# Patient Record
Sex: Male | Born: 1945 | Race: White | Hispanic: No | Marital: Married | Smoking: Current every day smoker
Health system: Southern US, Community
[De-identification: ages and names within clinical notes are randomized; demographics above are authoritative.]

## PROBLEM LIST (undated history)

## (undated) DIAGNOSIS — M1990 Unspecified osteoarthritis, unspecified site: Secondary | ICD-10-CM

## (undated) DIAGNOSIS — I10 Essential (primary) hypertension: Secondary | ICD-10-CM

## (undated) DIAGNOSIS — I639 Cerebral infarction, unspecified: Secondary | ICD-10-CM

## (undated) HISTORY — PX: CHOLECYSTECTOMY: SHX55

---

## 1969-07-28 HISTORY — PX: LUMBAR DISC SURGERY: SHX700

## 2005-11-16 ENCOUNTER — Emergency Department (HOSPITAL_COMMUNITY): Admission: EM | Admit: 2005-11-16 | Discharge: 2005-11-16 | Payer: Self-pay | Admitting: Family Medicine

## 2020-07-29 DIAGNOSIS — F17200 Nicotine dependence, unspecified, uncomplicated: Secondary | ICD-10-CM | POA: Insufficient documentation

## 2020-12-21 ENCOUNTER — Other Ambulatory Visit: Payer: Self-pay

## 2020-12-21 ENCOUNTER — Encounter (HOSPITAL_COMMUNITY): Payer: Self-pay | Admitting: Internal Medicine

## 2020-12-21 ENCOUNTER — Emergency Department (HOSPITAL_COMMUNITY): Payer: Medicare Other

## 2020-12-21 ENCOUNTER — Inpatient Hospital Stay (HOSPITAL_COMMUNITY)
Admission: EM | Admit: 2020-12-21 | Discharge: 2020-12-26 | DRG: 065 | Disposition: A | Discharge status: Rehabilitation Facility | Payer: Medicare Other | Attending: Internal Medicine | Admitting: Internal Medicine

## 2020-12-21 DIAGNOSIS — Z8673 Personal history of transient ischemic attack (TIA), and cerebral infarction without residual deficits: Secondary | ICD-10-CM

## 2020-12-21 DIAGNOSIS — R29810 Facial weakness: Secondary | ICD-10-CM | POA: Diagnosis present

## 2020-12-21 DIAGNOSIS — D649 Anemia, unspecified: Secondary | ICD-10-CM | POA: Diagnosis present

## 2020-12-21 DIAGNOSIS — N1831 Chronic kidney disease, stage 3a: Secondary | ICD-10-CM | POA: Diagnosis present

## 2020-12-21 DIAGNOSIS — G459 Transient cerebral ischemic attack, unspecified: Secondary | ICD-10-CM | POA: Diagnosis not present

## 2020-12-21 DIAGNOSIS — I10 Essential (primary) hypertension: Secondary | ICD-10-CM | POA: Diagnosis not present

## 2020-12-21 DIAGNOSIS — I160 Hypertensive urgency: Secondary | ICD-10-CM | POA: Diagnosis present

## 2020-12-21 DIAGNOSIS — F419 Anxiety disorder, unspecified: Secondary | ICD-10-CM | POA: Diagnosis present

## 2020-12-21 DIAGNOSIS — G4700 Insomnia, unspecified: Secondary | ICD-10-CM | POA: Diagnosis present

## 2020-12-21 DIAGNOSIS — N179 Acute kidney failure, unspecified: Secondary | ICD-10-CM | POA: Diagnosis present

## 2020-12-21 DIAGNOSIS — I639 Cerebral infarction, unspecified: Secondary | ICD-10-CM | POA: Diagnosis not present

## 2020-12-21 DIAGNOSIS — Z820 Family history of epilepsy and other diseases of the nervous system: Secondary | ICD-10-CM

## 2020-12-21 DIAGNOSIS — K5900 Constipation, unspecified: Secondary | ICD-10-CM | POA: Diagnosis present

## 2020-12-21 DIAGNOSIS — I129 Hypertensive chronic kidney disease with stage 1 through stage 4 chronic kidney disease, or unspecified chronic kidney disease: Secondary | ICD-10-CM | POA: Diagnosis present

## 2020-12-21 DIAGNOSIS — Z7982 Long term (current) use of aspirin: Secondary | ICD-10-CM

## 2020-12-21 DIAGNOSIS — Z20822 Contact with and (suspected) exposure to covid-19: Secondary | ICD-10-CM | POA: Diagnosis present

## 2020-12-21 DIAGNOSIS — R29700 NIHSS score 0: Secondary | ICD-10-CM | POA: Diagnosis present

## 2020-12-21 DIAGNOSIS — Z79899 Other long term (current) drug therapy: Secondary | ICD-10-CM

## 2020-12-21 DIAGNOSIS — R260 Ataxic gait: Secondary | ICD-10-CM | POA: Diagnosis present

## 2020-12-21 DIAGNOSIS — E785 Hyperlipidemia, unspecified: Secondary | ICD-10-CM | POA: Diagnosis present

## 2020-12-21 DIAGNOSIS — G8191 Hemiplegia, unspecified affecting right dominant side: Secondary | ICD-10-CM | POA: Diagnosis present

## 2020-12-21 DIAGNOSIS — Z720 Tobacco use: Secondary | ICD-10-CM

## 2020-12-21 DIAGNOSIS — F17210 Nicotine dependence, cigarettes, uncomplicated: Secondary | ICD-10-CM | POA: Diagnosis present

## 2020-12-21 DIAGNOSIS — Z7902 Long term (current) use of antithrombotics/antiplatelets: Secondary | ICD-10-CM

## 2020-12-21 DIAGNOSIS — E869 Volume depletion, unspecified: Secondary | ICD-10-CM | POA: Diagnosis present

## 2020-12-21 DIAGNOSIS — R739 Hyperglycemia, unspecified: Secondary | ICD-10-CM | POA: Diagnosis present

## 2020-12-21 DIAGNOSIS — R471 Dysarthria and anarthria: Secondary | ICD-10-CM | POA: Diagnosis present

## 2020-12-21 DIAGNOSIS — R531 Weakness: Secondary | ICD-10-CM

## 2020-12-21 DIAGNOSIS — F32A Depression, unspecified: Secondary | ICD-10-CM | POA: Diagnosis present

## 2020-12-21 DIAGNOSIS — J449 Chronic obstructive pulmonary disease, unspecified: Secondary | ICD-10-CM | POA: Diagnosis present

## 2020-12-21 DIAGNOSIS — D696 Thrombocytopenia, unspecified: Secondary | ICD-10-CM | POA: Diagnosis present

## 2020-12-21 HISTORY — DX: Essential (primary) hypertension: I10

## 2020-12-21 LAB — DIFFERENTIAL
Abs Immature Granulocytes: 0.02 10*3/uL (ref 0.00–0.07)
Basophils Absolute: 0.1 10*3/uL (ref 0.0–0.1)
Basophils Relative: 1 %
Eosinophils Absolute: 0.1 10*3/uL (ref 0.0–0.5)
Eosinophils Relative: 1 %
Immature Granulocytes: 0 %
Lymphocytes Relative: 32 %
Lymphs Abs: 2.3 10*3/uL (ref 0.7–4.0)
Monocytes Absolute: 0.6 10*3/uL (ref 0.1–1.0)
Monocytes Relative: 8 %
Neutro Abs: 4.2 10*3/uL (ref 1.7–7.7)
Neutrophils Relative %: 58 %

## 2020-12-21 LAB — CBC
HCT: 41.7 % (ref 39.0–52.0)
HCT: 43.1 % (ref 39.0–52.0)
Hemoglobin: 14.5 g/dL (ref 13.0–17.0)
Hemoglobin: 15.3 g/dL (ref 13.0–17.0)
MCH: 31.8 pg (ref 26.0–34.0)
MCH: 32 pg (ref 26.0–34.0)
MCHC: 34.8 g/dL (ref 30.0–36.0)
MCHC: 35.5 g/dL (ref 30.0–36.0)
MCV: 91.4 fL (ref 80.0–100.0)
MCV: 90.2 fL (ref 80.0–100.0)
Platelets: 96 10*3/uL — ABNORMAL LOW (ref 150–400)
Platelets: 93 10*3/uL — ABNORMAL LOW (ref 150–400)
RBC: 4.56 MIL/uL (ref 4.22–5.81)
RBC: 4.78 MIL/uL (ref 4.22–5.81)
RDW: 12.5 % (ref 11.5–15.5)
RDW: 12.4 % (ref 11.5–15.5)
WBC: 7.2 10*3/uL (ref 4.0–10.5)
WBC: 6.9 10*3/uL (ref 4.0–10.5)
nRBC: 0 % (ref 0.0–0.2)
nRBC: 0 % (ref 0.0–0.2)

## 2020-12-21 LAB — POCT I-STAT, CHEM 8
BUN: 22 mg/dL (ref 8–23)
Calcium, Ion: 1.25 mmol/L (ref 1.15–1.40)
Chloride: 106 mmol/L (ref 98–111)
Creatinine, Ser: 1.5 mg/dL — ABNORMAL HIGH (ref 0.61–1.24)
Glucose, Bld: 100 mg/dL — ABNORMAL HIGH (ref 70–99)
HCT: 38 % — ABNORMAL LOW (ref 39.0–52.0)
Hemoglobin: 12.9 g/dL — ABNORMAL LOW (ref 13.0–17.0)
Potassium: 3.7 mmol/L (ref 3.5–5.1)
Sodium: 142 mmol/L (ref 135–145)
TCO2: 26 mmol/L (ref 22–32)

## 2020-12-21 LAB — COMPREHENSIVE METABOLIC PANEL
ALT: 15 U/L (ref 0–44)
AST: 20 U/L (ref 15–41)
Albumin: 3.7 g/dL (ref 3.5–5.0)
Alkaline Phosphatase: 48 U/L (ref 38–126)
Anion gap: 5 (ref 5–15)
BUN: 19 mg/dL (ref 8–23)
CO2: 27 mmol/L (ref 22–32)
Calcium: 10 mg/dL (ref 8.9–10.3)
Chloride: 108 mmol/L (ref 98–111)
Creatinine, Ser: 1.51 mg/dL — ABNORMAL HIGH (ref 0.61–1.24)
GFR, Estimated: 49 mL/min — ABNORMAL LOW (ref >60)
Glucose, Bld: 104 mg/dL — ABNORMAL HIGH (ref 70–99)
Potassium: 3.8 mmol/L (ref 3.5–5.1)
Sodium: 140 mmol/L (ref 135–145)
Total Bilirubin: 0.9 mg/dL (ref 0.3–1.2)
Total Protein: 6.3 g/dL — ABNORMAL LOW (ref 6.5–8.1)

## 2020-12-21 LAB — PROTIME-INR
INR: 1 (ref 0.8–1.2)
Prothrombin Time: 13.5 seconds (ref 11.4–15.2)

## 2020-12-21 LAB — URINALYSIS, ROUTINE W REFLEX MICROSCOPIC
Bilirubin Urine: NEGATIVE
Glucose, UA: NEGATIVE mg/dL
Hgb urine dipstick: NEGATIVE
Ketones, ur: NEGATIVE mg/dL
Leukocytes,Ua: NEGATIVE
Nitrite: NEGATIVE
Protein, ur: NEGATIVE mg/dL
Specific Gravity, Urine: 1.02 (ref 1.005–1.030)
pH: 7 (ref 5.0–8.0)

## 2020-12-21 LAB — RAPID URINE DRUG SCREEN, HOSP PERFORMED
Amphetamines: NOT DETECTED
Barbiturates: NOT DETECTED
Benzodiazepines: POSITIVE — AB
Cocaine: NOT DETECTED
Opiates: NOT DETECTED
Tetrahydrocannabinol: NOT DETECTED

## 2020-12-21 LAB — RESP PANEL BY RT-PCR (FLU A&B, COVID) ARPGX2
Influenza A by PCR: NEGATIVE
Influenza B by PCR: NEGATIVE
SARS Coronavirus 2 by RT PCR: NEGATIVE

## 2020-12-21 LAB — CREATININE, SERUM
Creatinine, Ser: 1.37 mg/dL — ABNORMAL HIGH (ref 0.61–1.24)
GFR, Estimated: 55 mL/min — ABNORMAL LOW (ref >60)

## 2020-12-21 LAB — CBG MONITORING, ED: Glucose-Capillary: 100 mg/dL — ABNORMAL HIGH (ref 70–99)

## 2020-12-21 LAB — APTT: aPTT: 28 seconds (ref 24–36)

## 2020-12-21 LAB — LACTIC ACID, PLASMA: Lactic Acid, Venous: 0.8 mmol/L (ref 0.5–1.9)

## 2020-12-21 LAB — TSH: TSH: 1.017 u[IU]/mL (ref 0.350–4.500)

## 2020-12-21 LAB — CK: Total CK: 45 U/L — ABNORMAL LOW (ref 49–397)

## 2020-12-21 LAB — ETHANOL: Alcohol, Ethyl (B): <10 mg/dL (ref <10)

## 2020-12-21 MED ORDER — IOHEXOL 350 MG/ML SOLN
75.0000 mL | Freq: Once | INTRAVENOUS | Status: AC | PRN
  Administered 2020-12-21: 75 mL via INTRAVENOUS

## 2020-12-21 MED ORDER — TRAZODONE HCL 50 MG PO TABS
50.0000 mg | ORAL_TABLET | Freq: Every day | ORAL | Status: DC
  Administered 2020-12-21 – 2020-12-25 (×5): 50 mg via ORAL
  Filled 2020-12-21 (×5): qty 1

## 2020-12-21 MED ORDER — ASPIRIN 325 MG PO TABS: 325.0000 mg | ORAL_TABLET | Freq: Every day | ORAL | Status: DC

## 2020-12-21 MED ORDER — ALPRAZOLAM 0.5 MG PO TABS
0.5000 mg | ORAL_TABLET | Freq: Four times a day (QID) | ORAL | Status: DC
  Administered 2020-12-21 – 2020-12-26 (×18): 0.5 mg via ORAL
  Filled 2020-12-21 (×16): qty 1
  Filled 2020-12-21: qty 2
  Filled 2020-12-21: qty 1

## 2020-12-21 MED ORDER — STROKE: EARLY STAGES OF RECOVERY BOOK
Freq: Once | Status: AC
  Filled 2020-12-21: qty 1

## 2020-12-21 MED ORDER — ATENOLOL 25 MG PO TABS
25.0000 mg | ORAL_TABLET | Freq: Every day | ORAL | Status: DC
  Administered 2020-12-22 – 2020-12-26 (×5): 25 mg via ORAL
  Filled 2020-12-21 (×5): qty 1

## 2020-12-21 MED ORDER — ACETAMINOPHEN 325 MG PO TABS
650.0000 mg | ORAL_TABLET | ORAL | Status: DC | PRN
  Administered 2020-12-24 – 2020-12-26 (×5): 650 mg via ORAL
  Filled 2020-12-21 (×5): qty 2

## 2020-12-21 MED ORDER — CLOPIDOGREL BISULFATE 75 MG PO TABS
75.0000 mg | ORAL_TABLET | Freq: Every day | ORAL | Status: DC
  Administered 2020-12-22: 75 mg via ORAL
  Filled 2020-12-21: qty 1

## 2020-12-21 MED ORDER — SODIUM CHLORIDE 0.9% FLUSH
3.0000 mL | Freq: Once | INTRAVENOUS | Status: AC
  Administered 2020-12-21: 3 mL via INTRAVENOUS

## 2020-12-21 MED ORDER — OMEGA-3-ACID ETHYL ESTERS 1 G PO CAPS
1.0000 g | ORAL_CAPSULE | Freq: Every day | ORAL | Status: DC
  Administered 2020-12-22 – 2020-12-26 (×5): 1 g via ORAL
  Filled 2020-12-21 (×5): qty 1

## 2020-12-21 MED ORDER — SODIUM CHLORIDE 0.9 % IV SOLN: INTRAVENOUS | Status: DC

## 2020-12-21 MED ORDER — ENOXAPARIN SODIUM 40 MG/0.4ML IJ SOSY
40.0000 mg | PREFILLED_SYRINGE | INTRAMUSCULAR | Status: DC
  Administered 2020-12-21 – 2020-12-25 (×5): 40 mg via SUBCUTANEOUS
  Filled 2020-12-21 (×5): qty 0.4

## 2020-12-21 MED ORDER — HYDRALAZINE HCL 20 MG/ML IJ SOLN: 10.0000 mg | INTRAMUSCULAR | Status: DC | PRN

## 2020-12-21 MED ORDER — TRIAZOLAM 0.125 MG PO TABS
0.2500 mg | ORAL_TABLET | Freq: Every day | ORAL | Status: DC
  Administered 2020-12-21 – 2020-12-25 (×5): 0.25 mg via ORAL
  Filled 2020-12-21 (×3): qty 2
  Filled 2020-12-21: qty 1
  Filled 2020-12-21 (×2): qty 2

## 2020-12-21 MED ORDER — FLUOXETINE HCL 20 MG PO CAPS
40.0000 mg | ORAL_CAPSULE | Freq: Every day | ORAL | Status: DC
  Administered 2020-12-22 – 2020-12-26 (×5): 40 mg via ORAL
  Filled 2020-12-21 (×5): qty 2

## 2020-12-21 MED ORDER — ASPIRIN 300 MG RE SUPP: 300.0000 mg | Freq: Every day | RECTAL | Status: DC

## 2020-12-21 MED ORDER — ACETAMINOPHEN 160 MG/5ML PO SOLN: 650.0000 mg | ORAL | Status: DC | PRN

## 2020-12-21 MED ORDER — SIMVASTATIN 20 MG PO TABS
40.0000 mg | ORAL_TABLET | Freq: Every day | ORAL | Status: DC
  Administered 2020-12-21 – 2020-12-25 (×5): 40 mg via ORAL
  Filled 2020-12-21 (×5): qty 2

## 2020-12-21 MED ORDER — ACETAMINOPHEN 650 MG RE SUPP: 650.0000 mg | RECTAL | Status: DC | PRN

## 2020-12-21 MED ORDER — ASPIRIN EC 81 MG PO TBEC
81.0000 mg | DELAYED_RELEASE_TABLET | Freq: Every day | ORAL | Status: DC
  Administered 2020-12-22 – 2020-12-26 (×5): 81 mg via ORAL
  Filled 2020-12-21 (×5): qty 1

## 2020-12-21 MED ORDER — VITAMIN B-12 100 MCG PO TABS
100.0000 ug | ORAL_TABLET | Freq: Every day | ORAL | Status: DC
  Administered 2020-12-22 – 2020-12-26 (×5): 100 ug via ORAL
  Filled 2020-12-21 (×5): qty 1

## 2020-12-21 MED ORDER — UMECLIDINIUM-VILANTEROL 62.5-25 MCG/INH IN AEPB
1.0000 | INHALATION_SPRAY | Freq: Every day | RESPIRATORY_TRACT | Status: DC
  Administered 2020-12-23 – 2020-12-26 (×4): 1 via RESPIRATORY_TRACT
  Filled 2020-12-21: qty 14

## 2020-12-21 NOTE — Code Documentation (Signed)
Stroke Response Nurse Documentation Code Documentation  Juan Brewer is a 75 y.o. male arriving to [facility] ED via [facility] on 12/21/2020. Code stroke was activated by EMS. Patient from work where he was LKW at 1330 and complaining of right sided weakness and co-workers noticed slurred speech. He then went to urgent care and was referred to ED. On clopidogrel 75 mg daily. Stroke team at the bedside on patient arrival. Labs drawn and patient cleared for CT by Dr. Lenna Gilford. Patient to CT with team. NIHSS 0, see documentation for details and code stroke times. The following imaging was completed: CT, CTA head and neck. Patient is not a candidate for tPA due to LKW 1330.  Care/Plan Q2H NIHSS and VS Bedside handoff with ED RN Juan Brewer [name]  Rapid Response RN
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
## 2020-12-21 NOTE — ED Triage Notes (Signed)
PT BIB [facility] for Right sided weakness, ataxia, and rt sided facial droop as reported by [facility].  Upon EMS arrival on scene most symptoms had resolved but pt was still ataxic in his gate with weakness on the right.   LKW 13:30.

## 2020-12-21 NOTE — ED Provider Notes (Signed)
[facility] EMERGENCY DEPARTMENT Provider Note   CSN: [id number] Arrival date & time: 12/21/20  1802  An emergency department physician performed an initial assessment on this suspected stroke patient at 21.  History No chief complaint on file.   Juan Brewer is a 75 y.o. male.  Presented to ER as code stroke.  Last known well 1:30 PM.  At work and coworker noticed right facial droop, ataxia and slurred speech.  Initially went to urgent care again came to ER with concern for stroke.  Patient states that his symptoms are improving and he denies any ongoing complaints.  History limited due to acuity.  HPI     No past medical history on file.  There are no problems to display for this patient.    No family history on file.     Home Medications Prior to Admission medications   Not on File    Allergies    Patient has no known allergies.  Review of Systems   Review of Systems  Unable to perform ROS: Acuity of condition    Physical Exam Updated Vital Signs BP (!) 201/90 (BP Location: Right Arm)   Pulse 68   Temp 97.6 F (36.4 C) (Oral)   Resp 14   SpO2 95%   Physical Exam Vitals and nursing note reviewed.  Constitutional:      Appearance: He is well-developed.  HENT:     Head: Normocephalic and atraumatic.  Eyes:     Conjunctiva/sclera: Conjunctivae normal.  Cardiovascular:     Rate and Rhythm: Normal rate and regular rhythm.     Heart sounds: No murmur heard.   Pulmonary:     Effort: Pulmonary effort is normal. No respiratory distress.     Breath sounds: Normal breath sounds.  Abdominal:     Palpations: Abdomen is soft.     Tenderness: There is no abdominal tenderness.  Musculoskeletal:     Cervical back: Neck supple.  Skin:    General: Skin is warm and dry.  Neurological:     Mental Status: He is alert.     Comments: AAOx3 CN 2-12 intact, speech clear visual fields intact 5/5 strength in b/l UE and LE Sensation to  light touch intact in b/l UE and LE Normal FNF     ED Results / Procedures / Treatments   Labs (all labs ordered are listed, but only abnormal results are displayed) Labs Reviewed  CBC - Abnormal; Notable for the following components:      Result Value   Platelets 96 (*)    All other components within normal limits  COMPREHENSIVE METABOLIC PANEL - Abnormal; Notable for the following components:   Glucose, Bld 104 (*)    Creatinine, Ser 1.51 (*)    Total Protein 6.3 (*)    GFR, Estimated 49 (*)    All other components within normal limits  CBG MONITORING, ED - Abnormal; Notable for the following components:   Glucose-Capillary 100 (*)    All other components within normal limits  POCT I-STAT, CHEM 8 - Abnormal; Notable for the following components:   Creatinine, Ser 1.50 (*)    Glucose, Bld 100 (*)    Hemoglobin 12.9 (*)    HCT 38.0 (*)    All other components within normal limits  PROTIME-INR  APTT  DIFFERENTIAL  TSH  HEMOGLOBIN A1C  LIPID PANEL  RAPID URINE DRUG SCREEN, HOSP PERFORMED  ETHANOL  URINALYSIS, ROUTINE W REFLEX MICROSCOPIC  CK  LACTIC  ACID, PLASMA  I-STAT CHEM 8, ED    EKG None  Radiology CT HEAD CODE STROKE WO CONTRAST  Result Date: 12/21/2020 CLINICAL DATA:  Code stroke. Right-sided weakness with slurred speech. EXAM: CT HEAD WITHOUT CONTRAST TECHNIQUE: Contiguous axial images were obtained from the base of the skull through the vertex without intravenous contrast. COMPARISON:  CT head 07/29/2020. MRI 08/24/2020. FINDINGS: Brain: No evidence of acute large vascular territory infarction, hemorrhage, hydrocephalus, extra-axial collection or mass lesion/mass effect. Similar remote lacunar infarct in the left thalamus. Hypodensities in the basal ganglia appear similar to prior and were favored to reflect dilated perivascular spaces on prior MRI. Additional patchy white matter hypoattenuation likely represents the sequela of chronic microvascular ischemic  disease. Vascular: No hyperdense vessel identified. Calcific atherosclerosis. Skull: No acute fracture. Sinuses/Orbits: Moderate ethmoid air cell mucosal thickening. Additional mucosal thickening of the inferior left maxillary sinus. Other: No mastoid effusions. ASPECTS [facility] Stroke Program Early CT Score) total score (0-10 with 10 being normal): 10. IMPRESSION: 1. No evidence of acute intracranial abnormality.  ASPECTS is 10. 2. 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 3. Paranasal sinus disease, as detailed above. Code stroke imaging results were communicated on 12/21/2020 at 6:19 pm to provider Dr. Amada Jupiter via secure text paging. Electronically Signed   By: Feliberto Harts MD   On: 12/21/2020 18:20   CT ANGIO HEAD CODE STROKE  Result Date: 12/21/2020 CLINICAL DATA:  Neuro deficit, acute stroke suspected. EXAM: CT ANGIOGRAPHY HEAD AND NECK TECHNIQUE: Multidetector CT imaging of the head and neck was performed using the standard protocol during bolus administration of intravenous contrast. Multiplanar CT image reconstructions and MIPs were obtained to evaluate the vascular anatomy. Carotid stenosis measurements (when applicable) are obtained utilizing NASCET criteria, using the distal internal carotid diameter as the denominator. CONTRAST:  10mL OMNIPAQUE IOHEXOL 350 MG/ML SOLN COMPARISON:  Same day head CT. FINDINGS: CTA NECK FINDINGS Evaluation in the upper neck is limited by streak artifact from dental amalgam. Within this limitation: Arch: Great vessel origins are patent. Calcific atherosclerosis of the visualized aorta. Right carotid system: No evidence of dissection, stenosis (50% or greater) or occlusion. Minimal atherosclerosis at the carotid bifurcation. Left carotid system: No evidence of dissection, stenosis (50% or greater) or occlusion. Mild-to-moderate mixed calcific and noncalcific atherosclerosis at the carotid bifurcation without greater than 50%  narrowing. Retropharyngeal course of the ICA. Vertebral arteries: Mildly left dominant. No evidence of dissection, stenosis (50% or greater) or occlusion. 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 Other neck: No evidence of acute abnormality. Upper chest: Extensive centrilobular and paraseptal emphysema in the visualized lung apices. Biapical pleuroparenchymal scarring. Review of the MIP images confirms the above findings CTA HEAD FINDINGS Anterior circulation: Calcific atherosclerosis of bilateral cavernous and paraclinoid ICAs with approximately 40% narrowing of the right paraclinoid ICA. Bilateral MCAs and ACAs are patent without evidence of hemodynamically significant stenosis. Small/hypoplastic right A1 ACA. No aneurysm. Small right A-comm infundibulum. Posterior circulation: No large vessel occlusion, proximal hemodynamically significant stenosis, or aneurysm Venous sinuses: As permitted by contrast timing, patent. Small left transverse/sigmoid sinus. Review of the MIP images confirms the above findings IMPRESSION: CTA head: 1. No large vessel occlusion. 2. 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 CTA neck: 1. No significant (greater than 50%) stenosis. 2. Mild-to-moderate mixed left carotid bifurcation atherosclerosis. 3. Extensive centrilobular and paraseptal emphysema in the  visualized lung apices. If the patient meets criteria, consider annual low-dose CT lung screening. Electronically Signed   By: Feliberto HartsFrederick S Jones MD   On: 12/21/2020 18:55   CT ANGIO NECK CODE STROKE  Result Date: 12/21/2020 CLINICAL DATA:  Neuro deficit, acute stroke suspected. EXAM: CT ANGIOGRAPHY HEAD AND NECK TECHNIQUE: Multidetector CT imaging of the head and neck was performed using the standard protocol during bolus administration of intravenous contrast. Multiplanar CT  image reconstructions and MIPs were obtained to evaluate the vascular anatomy. Carotid stenosis measurements (when applicable) are obtained utilizing NASCET criteria, using the distal internal carotid diameter as the denominator. CONTRAST:  75mL OMNIPAQUE IOHEXOL 350 MG/ML SOLN COMPARISON:  Same day head CT. FINDINGS: CTA NECK FINDINGS Evaluation in the upper neck is limited by streak artifact from dental amalgam. Within this limitation: Arch: Great vessel origins are patent. Calcific atherosclerosis of the visualized aorta. Right carotid system: No evidence of dissection, stenosis (50% or greater) or occlusion. Minimal atherosclerosis at the carotid bifurcation. Left carotid system: No evidence of dissection, stenosis (50% or greater) or occlusion. Mild-to-moderate mixed calcific and noncalcific atherosclerosis at the carotid bifurcation without greater than 50% narrowing. Retropharyngeal course of the ICA. Vertebral arteries: Mildly left dominant. No evidence of dissection, stenosis (50% or greater) or occlusion. 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 Other neck: No evidence of acute abnormality. Upper chest: Extensive centrilobular and paraseptal emphysema in the visualized lung apices. Biapical pleuroparenchymal scarring. Review of the MIP images confirms the above findings CTA HEAD FINDINGS Anterior circulation: Calcific atherosclerosis of bilateral cavernous and paraclinoid ICAs with approximately 40% narrowing of the right paraclinoid ICA. Bilateral MCAs and ACAs are patent without evidence of hemodynamically significant stenosis. Small/hypoplastic right A1 ACA. No aneurysm. Small right A-comm infundibulum. Posterior circulation: No large vessel occlusion, proximal hemodynamically significant stenosis, or aneurysm Venous sinuses: As permitted by contrast timing, patent. Small left transverse/sigmoid  sinus. Review of the MIP images confirms the above findings IMPRESSION: CTA head: 1. No large vessel occlusion. 2. 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 CTA neck: 1. No significant (greater than 50%) stenosis. 2. Mild-to-moderate mixed left carotid bifurcation atherosclerosis. 3. Extensive centrilobular and paraseptal emphysema in the visualized lung apices. If the patient meets criteria, consider annual low-dose CT lung screening. Electronically Signed   By: Feliberto HartsFrederick S Jones MD   On: 12/21/2020 18:55    Procedures .Critical Care Performed by: Milagros Lollykstra, Quindell Shere S, MD Authorized by: Milagros Lollykstra, Zakyra Kukuk S, MD   Critical care provider statement:    Critical care time (minutes):  35   Critical care was necessary to treat or prevent imminent or life-threatening deterioration of the following conditions:  CNS failure or compromise   Critical care was time spent personally by me on the following activities:  Discussions with consultants, evaluation of patient's response to treatment, examination of patient, ordering and performing treatments and interventions, ordering and review of laboratory studies, ordering and review of radiographic studies, pulse oximetry, re-evaluation of patient's condition, obtaining history from patient or surrogate and review of old charts     Medications Ordered in ED Medications  sodium chloride flush (NS) 0.9 % injection 3 mL (has no administration in time range)  iohexol (OMNIPAQUE) 350 MG/ML injection 75 mL (75 mLs Intravenous Contrast Given 12/21/20 1819)    ED Course  I have reviewed the triage vital signs and the nursing notes.  Pertinent  labs & imaging results that were available during my care of the patient were reviewed by me and considered in my medical decision making (see chart for details).    MDM Rules/Calculators/A&P                         75 year old male with reported right-sided deficits and  aphasia presents as a stroke alert.  Symptoms have completely resolved.  Neuro recommends admission for TIA work-up.  Initial CT imaging negative, basic labs stable.  We will consult medicine for admission.  Final Clinical Impression(s) / ED Diagnoses Final diagnoses:  TIA (transient ischemic attack)    Rx / DC Orders ED Discharge Orders    None       Milagros Loll, MD 12/21/20 1924

## 2020-12-22 ENCOUNTER — Inpatient Hospital Stay (HOSPITAL_COMMUNITY): Payer: Medicare Other

## 2020-12-22 ENCOUNTER — Other Ambulatory Visit (HOSPITAL_COMMUNITY): Payer: Self-pay

## 2020-12-22 ENCOUNTER — Observation Stay (HOSPITAL_COMMUNITY): Payer: Medicare Other

## 2020-12-22 DIAGNOSIS — I160 Hypertensive urgency: Secondary | ICD-10-CM | POA: Diagnosis present

## 2020-12-22 DIAGNOSIS — D696 Thrombocytopenia, unspecified: Secondary | ICD-10-CM | POA: Diagnosis present

## 2020-12-22 DIAGNOSIS — R7989 Other specified abnormal findings of blood chemistry: Secondary | ICD-10-CM | POA: Diagnosis not present

## 2020-12-22 DIAGNOSIS — R471 Dysarthria and anarthria: Secondary | ICD-10-CM | POA: Diagnosis present

## 2020-12-22 DIAGNOSIS — Z8673 Personal history of transient ischemic attack (TIA), and cerebral infarction without residual deficits: Secondary | ICD-10-CM | POA: Diagnosis not present

## 2020-12-22 DIAGNOSIS — Z20822 Contact with and (suspected) exposure to covid-19: Secondary | ICD-10-CM | POA: Diagnosis present

## 2020-12-22 DIAGNOSIS — I63332 Cerebral infarction due to thrombosis of left posterior cerebral artery: Secondary | ICD-10-CM | POA: Diagnosis not present

## 2020-12-22 DIAGNOSIS — I6330 Cerebral infarction due to thrombosis of unspecified cerebral artery: Secondary | ICD-10-CM | POA: Diagnosis not present

## 2020-12-22 DIAGNOSIS — N1831 Chronic kidney disease, stage 3a: Secondary | ICD-10-CM | POA: Diagnosis present

## 2020-12-22 DIAGNOSIS — I10 Essential (primary) hypertension: Secondary | ICD-10-CM | POA: Diagnosis not present

## 2020-12-22 DIAGNOSIS — R260 Ataxic gait: Secondary | ICD-10-CM | POA: Diagnosis present

## 2020-12-22 DIAGNOSIS — F17210 Nicotine dependence, cigarettes, uncomplicated: Secondary | ICD-10-CM | POA: Diagnosis present

## 2020-12-22 DIAGNOSIS — I639 Cerebral infarction, unspecified: Principal | ICD-10-CM

## 2020-12-22 DIAGNOSIS — D649 Anemia, unspecified: Secondary | ICD-10-CM | POA: Diagnosis present

## 2020-12-22 DIAGNOSIS — F419 Anxiety disorder, unspecified: Secondary | ICD-10-CM | POA: Diagnosis present

## 2020-12-22 DIAGNOSIS — Z7902 Long term (current) use of antithrombotics/antiplatelets: Secondary | ICD-10-CM | POA: Diagnosis not present

## 2020-12-22 DIAGNOSIS — G8191 Hemiplegia, unspecified affecting right dominant side: Secondary | ICD-10-CM | POA: Diagnosis present

## 2020-12-22 DIAGNOSIS — E869 Volume depletion, unspecified: Secondary | ICD-10-CM | POA: Diagnosis present

## 2020-12-22 DIAGNOSIS — I6389 Other cerebral infarction: Secondary | ICD-10-CM | POA: Diagnosis not present

## 2020-12-22 DIAGNOSIS — Z720 Tobacco use: Secondary | ICD-10-CM | POA: Diagnosis not present

## 2020-12-22 DIAGNOSIS — I129 Hypertensive chronic kidney disease with stage 1 through stage 4 chronic kidney disease, or unspecified chronic kidney disease: Secondary | ICD-10-CM | POA: Diagnosis present

## 2020-12-22 DIAGNOSIS — N179 Acute kidney failure, unspecified: Secondary | ICD-10-CM | POA: Diagnosis present

## 2020-12-22 DIAGNOSIS — K5901 Slow transit constipation: Secondary | ICD-10-CM | POA: Diagnosis not present

## 2020-12-22 DIAGNOSIS — I6300 Cerebral infarction due to thrombosis of unspecified precerebral artery: Secondary | ICD-10-CM | POA: Diagnosis not present

## 2020-12-22 DIAGNOSIS — G4700 Insomnia, unspecified: Secondary | ICD-10-CM | POA: Diagnosis present

## 2020-12-22 DIAGNOSIS — Z79899 Other long term (current) drug therapy: Secondary | ICD-10-CM | POA: Diagnosis not present

## 2020-12-22 DIAGNOSIS — M7022 Olecranon bursitis, left elbow: Secondary | ICD-10-CM | POA: Diagnosis not present

## 2020-12-22 DIAGNOSIS — J449 Chronic obstructive pulmonary disease, unspecified: Secondary | ICD-10-CM | POA: Diagnosis present

## 2020-12-22 DIAGNOSIS — E785 Hyperlipidemia, unspecified: Secondary | ICD-10-CM | POA: Diagnosis present

## 2020-12-22 DIAGNOSIS — R29700 NIHSS score 0: Secondary | ICD-10-CM | POA: Diagnosis present

## 2020-12-22 DIAGNOSIS — F32A Depression, unspecified: Secondary | ICD-10-CM | POA: Diagnosis present

## 2020-12-22 DIAGNOSIS — R29810 Facial weakness: Secondary | ICD-10-CM | POA: Diagnosis present

## 2020-12-22 DIAGNOSIS — Z7982 Long term (current) use of aspirin: Secondary | ICD-10-CM | POA: Diagnosis not present

## 2020-12-22 LAB — ECHOCARDIOGRAM COMPLETE
AV Mean grad: 3 mmHg
AV Peak grad: 5.7 mmHg
Ao pk vel: 1.19 m/s
Area-P 1/2: 3.08 cm2

## 2020-12-22 LAB — LIPID PANEL
Cholesterol: 101 mg/dL (ref 0–200)
HDL: 38 mg/dL — ABNORMAL LOW (ref >40)
LDL Cholesterol: 45 mg/dL (ref 0–99)
Total CHOL/HDL Ratio: 2.7 RATIO
Triglycerides: 91 mg/dL (ref <150)
VLDL: 18 mg/dL (ref 0–40)

## 2020-12-22 MED ORDER — SODIUM CHLORIDE 0.9 % IV SOLN: INTRAVENOUS | Status: DC

## 2020-12-22 MED ORDER — PERFLUTREN LIPID MICROSPHERE
1.0000 mL | INTRAVENOUS | Status: AC | PRN
  Administered 2020-12-22: 2 mL via INTRAVENOUS
  Filled 2020-12-22: qty 10

## 2020-12-22 NOTE — ED Notes (Signed)
Pt returned from MRI °

## 2020-12-22 NOTE — Plan of Care (Signed)
  Problem: Education: Goal: Knowledge of General Education information will improve Description: Including pain rating scale, medication(s)/side effects and non-pharmacologic comfort measures Outcome: Progressing   Problem: Health Behavior/Discharge Planning: Goal: Ability to manage health-related needs will improve Outcome: Progressing   Problem: Clinical Measurements: Goal: Ability to maintain clinical measurements within normal limits will improve Outcome: Progressing Goal: Will remain free from infection Outcome: Progressing Goal: Diagnostic test results will improve Outcome: Progressing Goal: Respiratory complications will improve Outcome: Progressing Goal: Cardiovascular complication will be avoided Outcome: Progressing   Problem: Activity: Goal: Risk for activity intolerance will decrease Outcome: Progressing   Problem: Nutrition: Goal: Adequate nutrition will be maintained Outcome: Progressing   Problem: Coping: Goal: Level of anxiety will decrease Outcome: Progressing   Problem: Elimination: Goal: Will not experience complications related to bowel motility Outcome: Progressing Goal: Will not experience complications related to urinary retention Outcome: Progressing   Problem: Pain Managment: Goal: General experience of comfort will improve Outcome: Progressing   Problem: Safety: Goal: Ability to remain free from injury will improve Outcome: Progressing   Problem: Skin Integrity: Goal: Risk for impaired skin integrity will decrease Outcome: Progressing   Problem: Education: Goal: Knowledge of disease or condition will improve Outcome: Progressing Goal: Knowledge of secondary prevention will improve Outcome: Progressing Goal: Knowledge of patient specific risk factors addressed and post discharge goals established will improve Outcome: Progressing   Problem: Coping: Goal: Will verbalize positive feelings about self Outcome: Progressing

## 2020-12-22 NOTE — Progress Notes (Addendum)
STROKE TEAM PROGRESS NOTE   INTERVAL HISTORY No acute events. Today he reports persistent right sided impaired coordination, weakness and sensory changes with numbness. His gait is unsteady.  He recalls that  he was placed on Plavix/ASA daily by PCP for "stroke prevention" back in January. He developed easy bruising a few months ago and was advised by PCP to change ASA to QOD and continue taking plavix. He does not recall labs being drawn or ever being told he has a low platelet count. Then a couple of weeks ago he developed swollen left elbow which was drained by PCP about a week ago. PCP told them it was not pus or clear fluid like a bursitis but all blood when he drained it raising concern for hemearthrosis. He continued taking DAPT. He was supposed to return to PCP today for check.   We discussed his stroke diagnosis, ongoing work up and plan of care. Wife was present for discussion. All questions were discussed.  Vitals:   12/22/20 0630 12/22/20 0700 12/22/20 0730 12/22/20 0745  BP: 137/86 (!) 159/83 (!) 171/89 (!) 161/88  Pulse: (!) 54 (!) 51 (!) 54 (!) 57  Resp: 16 15 (!) 21 13  Temp:      TempSrc:      SpO2: 92% 96% 97% 97%   CBC:  Recent Labs  Lab 12/21/20 1807 12/21/20 1812 12/21/20 2100  WBC 7.2  --  6.9  NEUTROABS 4.2  --   --   HGB 14.5 12.9* 15.3  HCT 41.7 38.0* 43.1  MCV 91.4  --  90.2  PLT 96*  --  93*   Basic Metabolic Panel:  Recent Labs  Lab 12/21/20 1807 12/21/20 1812 12/21/20 2100  NA 140 142  --   K 3.8 3.7  --   CL 108 106  --   CO2 27  --   --   GLUCOSE 104* 100*  --   BUN 19 22  --   CREATININE 1.51* 1.50* 1.37*  CALCIUM 10.0  --   --    Lipid Panel:  Recent Labs  Lab 12/22/20 0450  CHOL 101  TRIG 91  HDL 38*  CHOLHDL 2.7  VLDL 18  LDLCALC 45   HgbA1c: No results for input(s): HGBA1C in the last 168 hours. Urine Drug Screen:  Recent Labs  Lab 12/21/20 1854  LABOPIA NONE DETECTED  COCAINSCRNUR NONE DETECTED  LABBENZ POSITIVE*   AMPHETMU NONE DETECTED  THCU NONE DETECTED  LABBARB NONE DETECTED    Alcohol Level  Recent Labs  Lab 12/21/20 1807  ETH <10    IMAGING past 24 hours MR BRAIN WO CONTRAST  Result Date: 12/22/2020 CLINICAL DATA:  Initial evaluation for acute right-sided weakness. EXAM: MRI HEAD WITHOUT CONTRAST TECHNIQUE: Multiplanar, multiecho pulse sequences of the brain and surrounding structures were obtained without intravenous contrast. COMPARISON:  Prior CTs from 12/21/2020. FINDINGS: Brain: Generalized age-related cerebral atrophy. Patchy and confluent T2/FLAIR hyperintensity within the periventricular deep white matter both cerebral hemispheres most consistent with chronic small vessel ischemic disease, moderate in nature. Approximate 1.5 cm curvilinear focus of restricted diffusion seen involving the posterior left lentiform nucleus/corona radiata (series 5, images 78, 80), consistent with a small acute perforator type infarct. No associated hemorrhage or mass effect. No other evidence for acute or subacute ischemia. Gray-white matter differentiation otherwise maintained. No other areas of encephalomalacia to suggest chronic cortical infarction. No acute intracranial hemorrhage. Single punctate chronic microhemorrhage noted at the parasagittal left occipital region, likely small  vessel related. No mass lesion, midline shift or mass effect. No hydrocephalus or extra-axial fluid collection. Pituitary gland suprasellar region normal. Midline structures intact. Vascular: Major intracranial vascular flow voids are maintained. Skull and upper cervical spine: Craniocervical junction within normal limits. Bone marrow signal intensity within normal limits. No scalp soft tissue abnormality. Sinuses/Orbits: Patient status post bilateral ocular lens replacement. Globes and orbital soft tissues demonstrate no acute finding. Moderate mucosal thickening noted within the ethmoidal air cells and maxillary sinuses. Paranasal  sinuses are otherwise clear. No significant mastoid effusion. Inner ear structures grossly normal. Other: None. IMPRESSION: 1. 1.5 cm acute perforator type infarct involving the posterior left lentiform nucleus/corona radiata. No associated hemorrhage or mass effect. 2. Underlying age-related cerebral atrophy with moderate chronic microvascular ischemic disease. Electronically Signed   By: Rise Mu M.D.   On: 12/22/2020 01:55   CT HEAD CODE STROKE WO CONTRAST  Result Date: 12/21/2020 CLINICAL DATA:  Code stroke. Right-sided weakness with slurred speech. EXAM: CT HEAD WITHOUT CONTRAST TECHNIQUE: Contiguous axial images were obtained from the base of the skull through the vertex without intravenous contrast. COMPARISON:  CT head 07/29/2020. MRI 08/24/2020. FINDINGS: Brain: No evidence of acute large vascular territory infarction, hemorrhage, hydrocephalus, extra-axial collection or mass lesion/mass effect. Similar remote lacunar infarct in the left thalamus. Hypodensities in the basal ganglia appear similar to prior and were favored to reflect dilated perivascular spaces on prior MRI. Additional patchy white matter hypoattenuation likely represents the sequela of chronic microvascular ischemic disease. Vascular: No hyperdense vessel identified. Calcific atherosclerosis. Skull: No acute fracture. Sinuses/Orbits: Moderate ethmoid air cell mucosal thickening. Additional mucosal thickening of the inferior left maxillary sinus. Other: No mastoid effusions. ASPECTS [facility] Stroke Program Early CT Score) total score (0-10 with 10 being normal): 10. IMPRESSION: 1. No evidence of acute intracranial abnormality.  ASPECTS is 10. 2. 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 3. Paranasal sinus disease, as detailed above. Code stroke imaging results were communicated on 12/21/2020 at 6:19 pm to provider Dr. Amada Jupiter via secure text paging. Electronically Signed   By:  Feliberto Harts MD   On: 12/21/2020 18:20   CT ANGIO HEAD CODE STROKE  Result Date: 12/21/2020 CLINICAL DATA:  Neuro deficit, acute stroke suspected. EXAM: CT ANGIOGRAPHY HEAD AND NECK TECHNIQUE: Multidetector CT imaging of the head and neck was performed using the standard protocol during bolus administration of intravenous contrast. Multiplanar CT image reconstructions and MIPs were obtained to evaluate the vascular anatomy. Carotid stenosis measurements (when applicable) are obtained utilizing NASCET criteria, using the distal internal carotid diameter as the denominator. CONTRAST:  82mL OMNIPAQUE IOHEXOL 350 MG/ML SOLN COMPARISON:  Same day head CT. FINDINGS: CTA NECK FINDINGS Evaluation in the upper neck is limited by streak artifact from dental amalgam. Within this limitation: Arch: Great vessel origins are patent. Calcific atherosclerosis of the visualized aorta. Right carotid system: No evidence of dissection, stenosis (50% or greater) or occlusion. Minimal atherosclerosis at the carotid bifurcation. Left carotid system: No evidence of dissection, stenosis (50% or greater) or occlusion. Mild-to-moderate mixed calcific and noncalcific atherosclerosis at the carotid bifurcation without greater than 50% narrowing. Retropharyngeal course of the ICA. Vertebral arteries: Mildly left dominant. No evidence of dissection, stenosis (50% or greater) or occlusion. 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 Other neck: No evidence of acute abnormality. Upper chest: Extensive centrilobular and paraseptal emphysema in the visualized lung apices. Biapical  pleuroparenchymal scarring. Review of the MIP images confirms the above findings CTA HEAD FINDINGS Anterior circulation: Calcific atherosclerosis of bilateral cavernous and paraclinoid ICAs with approximately 40% narrowing of the right paraclinoid ICA.  Bilateral MCAs and ACAs are patent without evidence of hemodynamically significant stenosis. Small/hypoplastic right A1 ACA. No aneurysm. Small right A-comm infundibulum. Posterior circulation: No large vessel occlusion, proximal hemodynamically significant stenosis, or aneurysm Venous sinuses: As permitted by contrast timing, patent. Small left transverse/sigmoid sinus. Review of the MIP images confirms the above findings IMPRESSION: CTA head: 1. No large vessel occlusion. 2. 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 CTA neck: 1. No significant (greater than 50%) stenosis. 2. Mild-to-moderate mixed left carotid bifurcation atherosclerosis. 3. Extensive centrilobular and paraseptal emphysema in the visualized lung apices. If the patient meets criteria, consider annual low-dose CT lung screening. Electronically Signed   By: Feliberto Harts MD   On: 12/21/2020 18:55   CT ANGIO NECK CODE STROKE  Result Date: 12/21/2020 CLINICAL DATA:  Neuro deficit, acute stroke suspected. EXAM: CT ANGIOGRAPHY HEAD AND NECK TECHNIQUE: Multidetector CT imaging of the head and neck was performed using the standard protocol during bolus administration of intravenous contrast. Multiplanar CT image reconstructions and MIPs were obtained to evaluate the vascular anatomy. Carotid stenosis measurements (when applicable) are obtained utilizing NASCET criteria, using the distal internal carotid diameter as the denominator. CONTRAST:  75mL OMNIPAQUE IOHEXOL 350 MG/ML SOLN COMPARISON:  Same day head CT. FINDINGS: CTA NECK FINDINGS Evaluation in the upper neck is limited by streak artifact from dental amalgam. Within this limitation: Arch: Great vessel origins are patent. Calcific atherosclerosis of the visualized aorta. Right carotid system: No evidence of dissection, stenosis (50% or greater) or occlusion. Minimal atherosclerosis at the carotid bifurcation. Left carotid system: No evidence of  dissection, stenosis (50% or greater) or occlusion. Mild-to-moderate mixed calcific and noncalcific atherosclerosis at the carotid bifurcation without greater than 50% narrowing. Retropharyngeal course of the ICA. Vertebral arteries: Mildly left dominant. No evidence of dissection, stenosis (50% or greater) or occlusion. 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 Other neck: No evidence of acute abnormality. Upper chest: Extensive centrilobular and paraseptal emphysema in the visualized lung apices. Biapical pleuroparenchymal scarring. Review of the MIP images confirms the above findings CTA HEAD FINDINGS Anterior circulation: Calcific atherosclerosis of bilateral cavernous and paraclinoid ICAs with approximately 40% narrowing of the right paraclinoid ICA. Bilateral MCAs and ACAs are patent without evidence of hemodynamically significant stenosis. Small/hypoplastic right A1 ACA. No aneurysm. Small right A-comm infundibulum. Posterior circulation: No large vessel occlusion, proximal hemodynamically significant stenosis, or aneurysm Venous sinuses: As permitted by contrast timing, patent. Small left transverse/sigmoid sinus. Review of the MIP images confirms the above findings IMPRESSION: CTA head: 1. No large vessel occlusion. 2. 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 CTA neck: 1. No significant (greater than 50%) stenosis. 2. Mild-to-moderate mixed left carotid bifurcation atherosclerosis. 3. Extensive centrilobular and paraseptal emphysema in the visualized lung apices. If the patient meets criteria, consider annual low-dose CT lung screening. Electronically Signed   By: Feliberto Harts MD   On: 12/21/2020 18:55   Physical Exam  Constitutional: Appears well-developed and well-nourished.  Psych: Affect appropriate to situation Eyes: No scleral  injection HENT: No OP obstrucion Head: Normocephalic.  Cardiovascular: Normal rate and regular rhythm.  Respiratory: Effort normal.  GI: Soft.  No distension.  There is no tenderness.  Skin: multiple scattered ecchymosis throughout extremities x4.   Neuro: Mental Status: Patient is awake, alert, oriented to person, place, month, year, and situation. Patient is able to give a clear and coherent history. No signs of neglect. Speech/Language:  Speech is clear, fluent without dysarthria or aphasia. Repetition, naming, and comprehension intact.  Cranial Nerves: II: Visual Fields are full. Pupils are equal, round, and reactive to light.  III,IV, VI: EOMI without ptosis or diploplia.  V: Facial sensation is symmetric to light touch in V1, V2, and V3 VII: Facial movement is symmetric.  VIII: hearing is intact to voice. X: Uvula elevates symmetrically. XI: Shoulder shrug is symmetric. XII: tongue is midline without atrophy or fasciculations.  Motor: RUE:  grips  5/5     biceps  5/5     triceps 5 /5 LUE:  grips  5/5    biceps  5/5     triceps  5/5 RLE: knee 5/5     thigh   5/5     plantar flexion   5/5     dorsiflexion  5/5 LLE: knee  5/5     thigh   5/5      plantar flexion  5/5     dorsiflexion   5/5 Tone is normal. Bulk is normal.   Sensory: Sensation is symmetric to light touch in all fours extremities. Extinction absent to light touch DSS.  Plantars: Toes are downgoing bilaterally.  Cerebellar: Impaired fine motor function of right hand noted with RAMs and impaired balance and unsteady gait seen while patient walking with PT in hallway.   ASSESSMENT/PLAN [name] is a 75 yo male with a PMHx of HTN, syncope and collapse, HLD, depression, anxiety,COPD, tobacco abuse (1.5 packs cigarettes per day)  and insomnia who presents via EMS as a code stroke. LKW 1330. He was at work and a Cabin crew noticed him having a right facial droop with ataxic gait and slurred speech. He went to urgent  care first, then was brought here by EMS for concerns of a stroke. By the time he arrived here, his symptoms were resolved. NIH 0 thus tPA not given.   Stroke - Acute ischemic stroke involving left lentiform nucleus/corona radiata likely due to SVD  Code Stroke [facility] showed no acute abnormality but remote lacunar infarct in left thalamus (new since Jan 22)   CTA head& neck: negative for LVO  MRI  1.5 cm acute perforator type infarct involving the posterior left lentiform nucleus/corona radiata. No associated hemorrhage or mass effect.  2D Echo complete, read is Pending  LDL 45  HgbA1c had to be sent out d/t lab difficulties. Pending  VTE prophylaxis - SCDs  On ASA 81 and Plavix since post hospital visit with PCP after January 2022 hospitalization for pneumonia (MRI negative for stroke). Compliance reported.  Easy bruising a couple of months ago prompted advice to change ASA to qod and keep plavix daily.  Reporting an an episode of ?hemearthrosis left elbow (PCP managing) in the past couple of weeks.   In setting of bleeding risk with questionable hemearthrosis vs. Bleeding cyst at left elbow, as well as thrombocytopenia, conservative management with ASA  daily and close PCP follow up needed.    Therapy recommendations:  CIR  Disposition:  CIR consult pending  Left elbow subcutaneous cystic lesion  Per pt, he followed with his PCP and had drainage of the cystic lesion, and he was told it was all blood in it.  He was scheduled to drain it again today  However, on my exam, the cystic lesion is painless, removable, clear margin, no discoloration, no pain on palpation, no elbow joint pain on range of motion, no decreased range of motion  Lesion does not consistent with intra-articular hematoma or subcutaneous hematoma  Given the pt report of blood content in the cystic lesion as well as thrombocytopenia, will decrease pt DAPT regimen to ASA 81mg  only.   Follow up with PCP  ASAP  Thrombocytopenia  Platelet 96->93  Along with patient reported blood component in the cystic lesion left elbow, change DAPT to aspirin 81 only.  Hypertension  BP to 200 systolic and 122 diastolic since arrival.   Currently improved 150-170/70-100 for past several hours.   Gradually normalize BP in 2-3 days. . Long-term BP goal normotensive  Hyperlipidemia  Home meds:  zocor 40mg    LDL 45, at goal < 70  LDL at goal, current regimen is adequate  Continue statin at discharge  Tobacco abuse  Current smoker  Smoking cessation counseling provided  Nicotine patch provided  Pt is willing to quit   Other Stroke Risk Factors  Advanced Age >/= 65   ETOH use, alcohol level <10, advised to drink no more than 2 drink(s) a day  Other Active Problems  Delila A Bailey-Modzik, NP-C   Hospital day # 0  ATTENDING NOTE: I reviewed above note and agree with the assessment and plan. Pt was seen and examined.   75 year old male with history of hypertension, hyperlipidemia, smoker, recent admission for syncope and pneumonia 07/2020 admitted for right facial droop, ataxia and slurred speech.  CT no acute abnormality.  CTA head and neck no LVO, 40% stenosis of the right ICA siphon.  MRI showed left BG/CR infarct.  2D echo pending, LDL 45, A1c pending.  Platelet 93, creatinine 1.5-> 1.37.  Per patient, he had admission 07/2020 at [facility] for syncope, found to have hypoxia and pneumonia.  MRI brain at that time showed no acute infarct.  After discharge, patient follow-up with his PCP and was put on aspirin 81 and Plavix 75 DAPT.  2 weeks later, he had bruises all over, discussed with his PCP, his aspirin 81 changed to once every 2 days and continue the Plavix.  Recently, he had the left elbow subcutaneous cystic lesion, his PCP did drainage and he was told to be "all blood".  He was scheduled to do another drainage today with his PCP.  However, on my exam, the cystic  lesion is painless, removable, clear margin, no discoloration, no pain on palpation, no elbow joint pain on range of motion, no decreased range of motion, lesion does not consistent with intra-articular hemorrhage or subcutaneous hematoma.  On neuro exam, patient awake alert, oriented x3, no aphasia, follows simple commands, no significant dysarthria, able to name and repeat.  Visual fields full, no gaze palsy, facial symmetrical, tongue midline.  Bilateral upper extremity 5/5, bilateral lower extremity proximal 5/5, distal ankle DF 4/5 on the right, 5/5 on the left.  Sensation symmetrical, finger-to-nose intact bilaterally.  Etiology for patient stroke likely small vessel disease given the location and risk factor.  He is on aspirin 81 every other day and Plavix 75 daily before admission, however, currently patient has thrombocytopenia and questionable left elbow bleeding cystic lesion, recommend continue aspirin 81 daily and DC Plavix.  Continue Zocor 40.  Discussed with Dr. 08/2020 via securing message.  Patient needed quit smoking and stroke risk  factor modification.  PT/OT recommend CIR.  Patient will follow up with his PCP closely for hospital follow-up and left elbow cystic lesion.  For detailed assessment and plan, please refer to above as I have made changes wherever appropriate.   Neurology will sign off. Please call with questions. [facility]. Thanks for the consult.   Marvel PlanJindong Lynnlee Revels, MD PhD Stroke Neurology 12/22/2020 4:19 PM     To contact Stroke Continuity provider, please refer to [email or web address]. After hours, contact General Neurology

## 2020-12-22 NOTE — Progress Notes (Signed)
Inpatient Rehab Admissions Coordinator Note:   Per therapy recommendations, pt was screened for CIR candidacy by Rakwon Letourneau, MS CCC-SLP. At this time, Pt. Appears to have functional decline and is a good candidate for CIR. Will place order for rehab consult per protocol.  Please contact me with questions.   Humzah Harty, MS, CCC-SLP Rehab Admissions Coordinator  [phone] (celll) [phone] (office)  

## 2020-12-22 NOTE — Evaluation (Signed)
Occupational Therapy Evaluation Patient Details Name: Juan Brewer MRN: [id number] DOB: 12/04/1945 Today's Date: 12/22/2020    History of Present Illness 75 y.o. male with history of hypertension, hyperlipidemia, tobacco abuse, COPD started experiencing right facial droop.  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   Clinical Impression   Patient admitted for the diagnosis above.  PTA he lives with his spouse, who can provide assist as needed, and was independent with all care and mobility.  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  Barriers are listed below.  Currently he is needing up to Mod a for transfers and in room mobility, and up to Mod A for lower body ADL.  Given his prior level of independence, ability to participate and motivation, CIR is being recommended.  OT will follow in the acute setting to maximize functional status.      Follow Up Recommendations  CIR    Equipment Recommendations  Tub/shower seat    Recommendations for Other Services Rehab consult     Precautions / Restrictions Precautions Precautions: Fall Restrictions Weight Bearing Restrictions: No      Mobility Bed Mobility Overal bed mobility: Needs Assistance Bed Mobility: Supine to Sit;Sit to Supine     Supine to sit: Min guard Sit to supine: Min guard     Patient Response: Impulsive  Transfers Overall transfer level: Needs assistance Equipment used: 1 person hand held assist Transfers: Sit to/from [employer] Sit to Stand: Min assist Stand pivot transfers: Mod assist            Balance Overall balance assessment: Needs assistance Sitting-balance support: Feet supported Sitting balance-Leahy Scale: Fair     Standing balance support: Bilateral upper extremity supported Standing balance-Leahy Scale: Poor                 High Level Balance Comments: needing external support            ADL either performed or assessed with clinical judgement   ADL Overall ADL's : Needs assistance/impaired     Grooming: Wash/dry hands;Minimal assistance           Upper Body Dressing : Min guard;Sitting   Lower Body Dressing: Sit to/from stand;Moderate assistance Lower Body Dressing Details (indicate cue type and reason): balance support Toilet Transfer: Moderate assistance Toilet Transfer Details (indicate cue type and reason): balance Toileting- Clothing Manipulation and Hygiene: Minimal assistance       Functional mobility during ADLs: Moderate assistance       Vision Baseline Vision/History: No visual deficits Patient Visual Report: No change from baseline Vision Assessment?: Yes Ocular Range of Motion: Within Functional Limits Alignment/Gaze Preference: Within Defined Limits Tracking/Visual Pursuits: Unable to hold eye position out of midline Saccades: Additional eye shifts occurred during testing Visual Fields: No apparent deficits Additional Comments: difficulty with visual attention and scanning.     Perception     Praxis      Pertinent Vitals/Pain Pain Assessment: No/denies pain Faces Pain Scale: No hurt Pain Intervention(s): Monitored during session     Hand Dominance Right   Extremity/Trunk Assessment Upper Extremity Assessment Upper Extremity Assessment: RUE deficits/detail RUE Deficits / Details: weakness noted compared to LUE RUE Sensation: WNL RUE Coordination: decreased fine motor;decreased gross motor   Lower Extremity Assessment Lower Extremity Assessment: Defer to PT evaluation   Cervical / Trunk Assessment Cervical / Trunk Assessment: Normal   Communication Communication Communication: No difficulties   Cognition  Arousal/Alertness: Awake/alert Behavior During Therapy: Impulsive Overall Cognitive Status: Within Functional Limits for tasks assessed                                 General Comments: passed  short blessed with zero errors   General Comments                  Home Living Family/patient expects to be discharged to:: Private residence Living Arrangements: Spouse/significant other Available Help at Discharge: Family;Available 24 hours/day Type of Home: House Home Access: Stairs to enter [employer] of Steps: 1   Home Layout: Multi-level;Able to live on main level with bedroom/bathroom Alternate Level Stairs-Number of Steps: full flight   Bathroom Shower/Tub: [employer]: Standard     Home Equipment: None      Lives With: Spouse    Prior Functioning/Environment Level of Independence: Independent        Comments: 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        OT Problem List: Decreased strength;Decreased range of motion;Impaired balance (sitting and/or standing);Decreased knowledge of use of DME or AE;Decreased safety awareness;Impaired UE functional use      OT Treatment/Interventions: Self-care/ADL training;Therapeutic exercise;Neuromuscular education;DME and/or AE instruction;Balance training;Patient/family education;Therapeutic activities    OT Goals(Current goals can be found in the care plan section) Acute Rehab OT Goals Patient Stated Goal: I need to get back to the office OT Goal Formulation: With patient Time For Goal Achievement: 01/05/21 Potential to Achieve Goals: Good ADL Goals Pt Will Perform Grooming: with supervision;sitting;standing Pt Will Perform Upper Body Bathing: with set-up;sitting Pt Will Perform Lower Body Bathing: with min guard assist;sit to/from stand Pt Will Perform Upper Body Dressing: with set-up;sitting Pt Will Perform Lower Body Dressing: with min guard assist;sit to/from stand Pt Will Transfer to Toilet: with supervision;ambulating;regular height toilet Pt Will Perform Toileting - Clothing Manipulation and hygiene: sit to/from stand;with  supervision  OT Frequency: Min 2X/week   Barriers to D/C:    none noted       Co-evaluation              AM-PAC OT "6 Clicks" Daily Activity     Outcome Measure Help from another person eating meals?: None Help from another person taking care of personal grooming?: A Little Help from another person toileting, which includes using toliet, bedpan, or urinal?: A Lot Help from another person bathing (including washing, rinsing, drying)?: A Lot Help from another person to put on and taking off regular upper body clothing?: A Little Help from another person to put on and taking off regular lower body clothing?: A Lot 6 Click Score: 16   End of Session Equipment Utilized During Treatment: Gait belt Nurse Communication: Mobility status  Activity Tolerance: Patient tolerated treatment well Patient left: in bed;with call bell/phone within reach;with bed alarm set;with family/visitor present  OT Visit Diagnosis: Unsteadiness on feet (R26.81);Muscle weakness (generalized) (M62.81);Ataxia, unspecified (R27.0);Hemiplegia and hemiparesis Hemiplegia - Right/Left: Right Hemiplegia - dominant/non-dominant: Dominant Hemiplegia - caused by: Cerebral infarction                Time: 3790-2409 OT Time Calculation (min): 22 min Charges:  OT General Charges $OT Visit: 1 Visit OT Evaluation $OT Eval Moderate Complexity: 1 Mod  12/22/2020  Rich, OTR/L  Acute Rehabilitation Services  Office:  [phone]   Juan Brewer  Juan Brewer 12/22/2020, 12:22 PM
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
## 2020-12-22 NOTE — ED Notes (Signed)
Report given to Mikel Cella, RN [phone]

## 2020-12-22 NOTE — ED Notes (Signed)
Pt found to be desatting while sleeping.  Pt placed on 3 L for support.  Sats increased from 85-93%.

## 2020-12-22 NOTE — ED Notes (Signed)
Pt transported to MRI 
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
## 2020-12-22 NOTE — ED Notes (Signed)
Attempted to call report to the floor. 

## 2020-12-23 DIAGNOSIS — I10 Essential (primary) hypertension: Secondary | ICD-10-CM | POA: Diagnosis not present
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
## 2020-12-24 ENCOUNTER — Encounter (HOSPITAL_COMMUNITY): Payer: Self-pay | Admitting: Internal Medicine

## 2020-12-24 DIAGNOSIS — I10 Essential (primary) hypertension: Secondary | ICD-10-CM | POA: Diagnosis not present

## 2020-12-24 LAB — CBC
HCT: 40.1 % (ref 39.0–52.0)
Hemoglobin: 13.8 g/dL (ref 13.0–17.0)
MCH: 31.8 pg (ref 26.0–34.0)
MCHC: 34.4 g/dL (ref 30.0–36.0)
MCV: 92.4 fL (ref 80.0–100.0)
Platelets: 88 10*3/uL — ABNORMAL LOW (ref 150–400)
RBC: 4.34 MIL/uL (ref 4.22–5.81)
RDW: 12.5 % (ref 11.5–15.5)
WBC: 6.8 10*3/uL (ref 4.0–10.5)
nRBC: 0.3 % — ABNORMAL HIGH (ref 0.0–0.2)

## 2020-12-24 LAB — MAGNESIUM: Magnesium: 2.2 mg/dL (ref 1.7–2.4)

## 2020-12-24 LAB — RENAL FUNCTION PANEL
Albumin: 3.3 g/dL — ABNORMAL LOW (ref 3.5–5.0)
Anion gap: 8 (ref 5–15)
BUN: 16 mg/dL (ref 8–23)
CO2: 20 mmol/L — ABNORMAL LOW (ref 22–32)
Calcium: 9.3 mg/dL (ref 8.9–10.3)
Chloride: 109 mmol/L (ref 98–111)
Creatinine, Ser: 1.3 mg/dL — ABNORMAL HIGH (ref 0.61–1.24)
GFR, Estimated: 58 mL/min — ABNORMAL LOW (ref >60)
Glucose, Bld: 118 mg/dL — ABNORMAL HIGH (ref 70–99)
Phosphorus: 2.4 mg/dL — ABNORMAL LOW (ref 2.5–4.6)
Potassium: 3.6 mmol/L (ref 3.5–5.1)
Sodium: 137 mmol/L (ref 135–145)
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
## 2020-12-24 NOTE — Progress Notes (Addendum)
Inpatient Rehab Admissions Coordinator:   Met with patient at bedside to discuss potential CIR admission. Pt. Stated interest. I also called Pt.'s wife to confirm support at d/c and left a voicemail with request for callback.  Will pursue for potential admit this week, pending bed availability and medical readiness.   Clemens Catholic, [place], [place] Admissions Coordinator  [phone] ([place]) [phone] (office)
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
## 2020-12-24 NOTE — Progress Notes (Signed)
Occupational Therapy Treatment Patient Details Name: Juan Brewer MRN: [id number] DOB: 10/11/1945 Today's Date: 12/24/2020    History of present illness 75 y.o. male with history of hypertension, hyperlipidemia, tobacco abuse, COPD started experiencing right facial droop.  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   OT comments  This 75 yo male admitted with above presents to acute OT with making progress with bed mobility, transfers, standing at sink for grooming tasks, LBD, and use of RUE. He will continue to benefit from acute OT with follow up on CIR to get back to his independent level of function.  Follow Up Recommendations  CIR;Supervision - Intermittent    Equipment Recommendations  Tub/shower seat;3 in 1 bedside commode    Recommendations for Other Services Rehab consult    Precautions / Restrictions Precautions Precautions: Fall Restrictions Weight Bearing Restrictions: No       Mobility Bed Mobility Overal bed mobility: Needs Assistance Bed Mobility: Supine to Sit     Supine to sit: Min guard;HOB elevated    Transfers Overall transfer level: Needs assistance Equipment used: Rolling walker (2 wheeled) Transfers: Sit to/from Stand Sit to Stand: Min assist         General transfer comment: Cues for safe hand placement    Balance Overall balance assessment: Needs assistance Sitting-balance support: No upper extremity supported;Feet supported Sitting balance-Leahy Scale: Good Postural control: Right lateral lean Standing balance support: No upper extremity supported;During functional activity Standing balance-Leahy Scale: Fair Standing balance comment: standing at sink to groom                         ADL either performed or assessed with clinical judgement   ADL Overall ADL's : Needs assistance/impaired     Grooming: Wash/dry face;Oral care;Brushing hair;Standing;Min guard                Lower Body Dressing: Minimal assistance;Sit to/from stand Lower Body Dressing Details (indicate cue type and reason): Can cross legs to get to feet Toilet Transfer: Minimal assistance;Ambulation;RW Toilet Transfer Details (indicate cue type and reason): simulated bed>sink to groom>sit in recliner                 Vision Patient Visual Report: No change from baseline            Cognition Arousal/Alertness: Awake/alert Behavior During Therapy: WFL for tasks assessed/performed Overall Cognitive Status: Impaired/Different from baseline Area of Impairment: Safety/judgement                   General Comments: Thinks he could safely get from recliner to bed by himself        Exercises Other Exercises Other Exercises: worked on sit<>stand, partial sit>stand, and pushing through Bil UEs in forward plane. Had him work on picking up coins, palming, putting them down; opening and closing containers in his room. Gave him word searches to work on pre-writing skills with RUE.      General Comments VSS    Pertinent Vitals/ Pain       Pain Assessment: Faces Faces Pain Scale: Hurts a little bit Pain Location: low back Pain Descriptors / Indicators: Sore Pain Intervention(s): Limited activity within patient's tolerance;Monitored during session;Repositioned         Frequency  Min 2X/week        Progress Toward Goals  OT Goals(current goals can now be found in the care plan section)  Progress towards OT goals: Progressing  toward goals  Acute Rehab OT Goals Patient Stated Goal: get back to independence and work OT Goal Formulation: With patient Time For Goal Achievement: 01/05/21 Potential to Achieve Goals: Good  Plan Discharge plan remains appropriate       AM-PAC OT "6 Clicks" Daily Activity     Outcome Measure   Help from another person eating meals?: None Help from another person taking care of personal grooming?: A Little Help from another person  toileting, which includes using toliet, bedpan, or urinal?: A Little Help from another person bathing (including washing, rinsing, drying)?: A Little Help from another person to put on and taking off regular upper body clothing?: A Little Help from another person to put on and taking off regular lower body clothing?: A Little 6 Click Score: 19    End of Session Equipment Utilized During Treatment: Gait belt;Rolling walker  OT Visit Diagnosis: Unsteadiness on feet (R26.81);Muscle weakness (generalized) (M62.81);Hemiplegia and hemiparesis Hemiplegia - Right/Left: Right Hemiplegia - dominant/non-dominant: Dominant Hemiplegia - caused by: Cerebral infarction   Activity Tolerance Patient tolerated treatment well   Patient Left in chair;with call bell/phone within reach;with chair alarm set;with family/visitor present           Time: 1351-1441 OT Time Calculation (min): 50 min  Charges: OT General Charges $OT Visit: 1 Visit OT Evaluation $OT Eval Moderate Complexity: 1 Mod OT Treatments $Self Care/Home Management : 23-37 mins $Therapeutic Activity: 8-22 mins  Ignacia Palma, OTR/L Acute [employer] Pager [phone] Office [phone]      Evette Georges 12/24/2020, 3:13 PM
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
## 2020-12-24 NOTE — Progress Notes (Signed)
Physical Therapy Treatment Patient Details Name: Juan Brewer MRN: [id number] DOB: [birth year] Today's Date: 12/24/2020    History of Present Illness 75 y.o. male with history of hypertension, hyperlipidemia, tobacco abuse, COPD started experiencing right facial droop.  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    PT Comments    Pt continues to be very motivated to return to independence. Pt able to come to EOB with increased time and vc's for use of R side, loses balance multiple times to R before achieving upright sitting. Worked on SL stance activities on R and L with mod A. Pt ambulated 150' with Mod HHA first 59' and mod HHA plus L railing last 75'. With distance pt has increased R knee flexion and poor R foot placement. Pt remains an excellent candidate for CIR level therapies. PT will continue to follow.    Follow Up Recommendations  CIR;Supervision/Assistance - 24 hour     Equipment Recommendations  Other (comment) (TBD)    Recommendations for Other Services Rehab consult     Precautions / Restrictions Precautions Precautions: Fall Restrictions Weight Bearing Restrictions: No    Mobility  Bed Mobility Overal bed mobility: Needs Assistance Bed Mobility: Supine to Sit;Sit to Supine     Supine to sit: Min guard Sit to supine: Min guard   General bed mobility comments: pt needed >1 min to come to EOB, vc's for use of RUE, no physical assist given. Pt returns to supine more easily    Transfers Overall transfer level: Needs assistance Equipment used: 1 person hand held assist Transfers: Sit to/from Stand Sit to Stand: Min assist         General transfer comment: min A to steady and vc's to widen BOS  Ambulation/Gait Ambulation/Gait assistance: Mod assist Gait Distance (Feet): 150 Feet Assistive device: 1 person hand held assist Gait Pattern/deviations: Decreased step length - right;Decreased weight shift to  right;Ataxic;Step-through pattern;Narrow base of support Gait velocity: decreased Gait velocity interpretation: <1.8 ft/sec, indicate of risk for recurrent falls General Gait Details: pt with mod HHA first 75', needed L rail and HHA last 62' for safety. vc's for standing rest break x4 due to RLE fatigue with increased knee flexion.   Stairs             Wheelchair Mobility    Modified Rankin (Stroke Patients Only) Modified Rankin (Stroke Patients Only) Pre-Morbid Rankin Score: No symptoms Modified Rankin: Moderately severe disability     Balance Overall balance assessment: Needs assistance Sitting-balance support: Feet supported Sitting balance-Leahy Scale: Poor Sitting balance - Comments: pt can maintain static sitting but loses balance to R and posterior with perturbation or LE mvmt Postural control: Right lateral lean Standing balance support: Single extremity supported Standing balance-Leahy Scale: Poor Standing balance comment: consistent LOB to R               High Level Balance Comments: worked on [employer] with R foot on stool moving stool fwd and back, then L foot on stool. Pt unable to move stool fwd with L foot, only to side. 5 reps each side            Cognition Arousal/Alertness: Awake/alert Behavior During Therapy: Impulsive Overall Cognitive Status: Impaired/Different from baseline Area of Impairment: Safety/judgement;Attention;Awareness;Problem solving                   Current Attention Level: Sustained     Safety/Judgement: Decreased awareness of safety;Decreased awareness of deficits Awareness:  Emergent Problem Solving: Requires verbal cues;Difficulty sequencing General Comments: pt remaining focused better during session today      Exercises      General Comments General comments (skin integrity, edema, etc.): VSS      Pertinent Vitals/Pain Pain Assessment: Faces Faces Pain Scale: Hurts a little bit Pain Location: low  back (h/o L4 sx 50 yrs ago) Pain Descriptors / Indicators: Sore Pain Intervention(s): Limited activity within patient's tolerance;Monitored during session    Home Living                      Prior Function            PT Goals (current goals can now be found in the care plan section) Acute Rehab PT Goals Patient Stated Goal: get back to independence PT Goal Formulation: With patient/family Time For Goal Achievement: 01/05/21 Potential to Achieve Goals: Good Progress towards PT goals: Progressing toward goals    Frequency    Min 4X/week      PT Plan Current plan remains appropriate    Co-evaluation              AM-PAC PT "6 Clicks" Mobility   Outcome Measure  Help needed turning from your back to your side while in a flat bed without using bedrails?: A Little Help needed moving from lying on your back to sitting on the side of a flat bed without using bedrails?: A Little Help needed moving to and from a bed to a chair (including a wheelchair)?: A Little Help needed standing up from a chair using your arms (e.g., wheelchair or bedside chair)?: A Lot Help needed to walk in hospital room?: A Lot Help needed climbing 3-5 steps with a railing? : Total 6 Click Score: 14    End of Session Equipment Utilized During Treatment: Gait belt Activity Tolerance: Patient tolerated treatment well Patient left: in bed;with call bell/phone within reach;with bed alarm set Nurse Communication: Mobility status PT Visit Diagnosis: Unsteadiness on feet (R26.81);Ataxic gait (R26.0);Difficulty in walking, not elsewhere classified (R26.2);Hemiplegia and hemiparesis Hemiplegia - Right/Left: Right Hemiplegia - dominant/non-dominant: Dominant Hemiplegia - caused by: Cerebral infarction     Time: 1211-1242 PT Time Calculation (min) (ACUTE ONLY): 31 min  Charges:  $Gait Training: 8-22 mins $Neuromuscular Re-education: 8-22 mins                     Lyanne Co, PT  Acute  Rehab Services  Pager [phone] Office [phone]    Lawana Chambers Haven Pylant 12/24/2020, 1:52 PM

## 2020-12-25 DIAGNOSIS — I10 Essential (primary) hypertension: Secondary | ICD-10-CM | POA: Diagnosis not present

## 2020-12-25 LAB — HEMOGLOBIN A1C
Hgb A1c MFr Bld: 5.6 % (ref 4.8–5.6)
Hgb A1c MFr Bld: 5.5 % (ref 4.8–5.6)
Mean Plasma Glucose: 114 mg/dL
Mean Plasma Glucose: 111 mg/dL
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
## 2020-12-25 NOTE — Progress Notes (Signed)
Physical Therapy Treatment Patient Details Name: Juan Brewer MRN: [id number] DOB: [birth year] Today's Date: 12/25/2020    History of Present Illness 75 y.o. male with history of hypertension, hyperlipidemia, tobacco abuse, COPD started experiencing right facial droop.  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    PT Comments    Pt continues to progress though ambulation was more difficult today after performing RUE and RLE exercises in room. Worked on trunk extension in high kneeling on recliner. Pt practiced transfers from unstable surface with mod A. Worked on coordination and sitting balance with balloon sitting edge of chair. Pt needed max A to ambulate 30' and mod A for 20' with RW after doing exercises due to RLE fatigue. Pt will greatly benefit from fast paced high intensity environment on CIR, continue to recommend. PT will continue to follow.    Follow Up Recommendations  CIR;Supervision/Assistance - 24 hour     Equipment Recommendations  Other (comment) (TBD)    Recommendations for Other Services Rehab consult     Precautions / Restrictions Precautions Precautions: Fall Restrictions Weight Bearing Restrictions: No    Mobility  Bed Mobility Overal bed mobility: Needs Assistance Bed Mobility: Supine to Sit     Supine to sit: HOB elevated;Supervision     General bed mobility comments: with HOB elevated, pt able to come to EOB with greater ease and less time needed. Supervision for safety    Transfers Overall transfer level: Needs assistance Equipment used: None Transfers: Sit to/from [employer] Transfers Sit to Stand: Min assist;Mod assist Stand pivot transfers: Mod assist       General transfer comment: practiced sit<>stand and SPT from bed and from rolling stool. Needed mod A with stool, had trouble stabilizing.  Ambulation/Gait Ambulation/Gait assistance: Mod assist;Max assist Gait Distance (Feet): 50  Feet Assistive device: 1 person hand held assist;Rolling walker (2 wheeled) Gait Pattern/deviations: Decreased step length - right;Ataxic;Step-through pattern;Narrow base of support Gait velocity: decreased Gait velocity interpretation: <1.31 ft/sec, indicative of household ambulator General Gait Details: practiced ambulation after other LE work and pt had much more trouble walking. Heavy R lean with ataxic RLE. Max A without AD, mod A with RW   Stairs             Wheelchair Mobility    Modified Rankin (Stroke Patients Only) Modified Rankin (Stroke Patients Only) Pre-Morbid Rankin Score: No symptoms Modified Rankin: Moderately severe disability     Balance Overall balance assessment: Needs assistance Sitting-balance support: No upper extremity supported;Feet supported Sitting balance-Leahy Scale: Good Sitting balance - Comments: worked on maintaining sitting balance while sitting on rolling stool and moving fwd and bkwd. Also worked on hitting balloon with R hand in sitting edge of chair reaching out of BOS   Standing balance support: No upper extremity supported;During functional activity Standing balance-Leahy Scale: Poor Standing balance comment: stood with min A and no UE support to brush teeth, needed tactile cues R quad to keep from collapsing R side               High Level Balance Comments: attempted quadruped on bed but pt could not get RLE under him. Then worked high kneel in Medical illustrator facing bkwd. Pt was able to do this and work on active glut activation            Cognition Arousal/Alertness: Awake/alert Behavior During Therapy: WFL for tasks assessed/performed Overall Cognitive Status: Impaired/Different from baseline Area of Impairment: Safety/judgement  Current Attention Level: Selective     Safety/Judgement: Decreased awareness of safety;Decreased awareness of deficits Awareness: Emergent Problem Solving: Requires  verbal cues;Difficulty sequencing General Comments: worked on multitasking today with simultaneous UE and LE work, was very challenging for him      Exercises Total Joint Exercises Long Arc Quad: AROM;Right;20 reps;Seated (kicking balloon, trying to get full knee ext)    General Comments General comments (skin integrity, edema, etc.): VSS      Pertinent Vitals/Pain Pain Assessment: Faces Faces Pain Scale: No hurt    Home Living                      Prior Function            PT Goals (current goals can now be found in the care plan section) Acute Rehab PT Goals Patient Stated Goal: get back to independence and work PT Goal Formulation: With patient/family Time For Goal Achievement: 01/05/21 Potential to Achieve Goals: Good Progress towards PT goals: Progressing toward goals    Frequency    Min 4X/week      PT Plan Current plan remains appropriate    Co-evaluation              AM-PAC PT "6 Clicks" Mobility   Outcome Measure  Help needed turning from your back to your side while in a flat bed without using bedrails?: A Little Help needed moving from lying on your back to sitting on the side of a flat bed without using bedrails?: A Little Help needed moving to and from a bed to a chair (including a wheelchair)?: A Little Help needed standing up from a chair using your arms (e.g., wheelchair or bedside chair)?: A Lot Help needed to walk in hospital room?: A Lot Help needed climbing 3-5 steps with a railing? : Total 6 Click Score: 14    End of Session Equipment Utilized During Treatment: Gait belt Activity Tolerance: Patient tolerated treatment well Patient left: with call bell/phone within reach;in chair;with chair alarm set Nurse Communication: Mobility status PT Visit Diagnosis: Unsteadiness on feet (R26.81);Ataxic gait (R26.0);Difficulty in walking, not elsewhere classified (R26.2);Hemiplegia and hemiparesis Hemiplegia - Right/Left:  Right Hemiplegia - dominant/non-dominant: Dominant Hemiplegia - caused by: Cerebral infarction     Time: 7939-0300 PT Time Calculation (min) (ACUTE ONLY): 49 min  Charges:  $Gait Training: 8-22 mins $Therapeutic Activity: 8-22 mins $Neuromuscular Re-education: 8-22 mins                     Lyanne Co, PT  Acute Rehab Services  Pager [phone] Office [phone]    Lawana Chambers Enrika Aguado 12/25/2020, 10:38 AM

## 2020-12-25 NOTE — Plan of Care (Signed)
  Problem: Clinical Measurements: Goal: Ability to maintain clinical measurements within normal limits will improve Outcome: Progressing Goal: Will remain free from infection Outcome: Progressing Goal: Diagnostic test results will improve Outcome: Progressing Goal: Respiratory complications will improve Outcome: Progressing Goal: Cardiovascular complication will be avoided Outcome: Progressing   Problem: Coping: Goal: Level of anxiety will decrease Outcome: Progressing   Problem: Ischemic Stroke/TIA Tissue Perfusion: Goal: Complications of ischemic stroke/TIA will be minimized Outcome: Progressing

## 2020-12-25 NOTE — Care Management Important Message (Signed)
Important Message  Patient Details  Name: Juan Brewer MRN: [id number] Date of Birth: 10-May-1946   Medicare Important Message Given:  Yes     Dorena Bodo 12/25/2020, 2:57 PM

## 2020-12-25 NOTE — Progress Notes (Signed)
Inpatient Rehab Admissions Coordinator:   I do not have a CIR bed for this pt today, but will follow for potential admit this week pending bed availability.   Megan Salon, MS, CCC-SLP Rehab Admissions Coordinator  [phone] (celll) [phone] (office)

## 2020-12-26 ENCOUNTER — Inpatient Hospital Stay (HOSPITAL_COMMUNITY)
Admission: RE | Admit: 2020-12-26 | Discharge: 2021-01-12 | DRG: 057 | Disposition: A | Discharge status: Home / Self Care | Payer: Medicare Other | Source: Intra-hospital | Attending: Physical Medicine and Rehabilitation | Admitting: Physical Medicine and Rehabilitation

## 2020-12-26 ENCOUNTER — Encounter (HOSPITAL_COMMUNITY): Payer: Self-pay | Admitting: Physical Medicine and Rehabilitation

## 2020-12-26 ENCOUNTER — Other Ambulatory Visit: Payer: Self-pay

## 2020-12-26 DIAGNOSIS — E785 Hyperlipidemia, unspecified: Secondary | ICD-10-CM

## 2020-12-26 DIAGNOSIS — J449 Chronic obstructive pulmonary disease, unspecified: Secondary | ICD-10-CM | POA: Diagnosis present

## 2020-12-26 DIAGNOSIS — R531 Weakness: Secondary | ICD-10-CM

## 2020-12-26 DIAGNOSIS — K5900 Constipation, unspecified: Secondary | ICD-10-CM | POA: Diagnosis present

## 2020-12-26 DIAGNOSIS — F419 Anxiety disorder, unspecified: Secondary | ICD-10-CM | POA: Diagnosis present

## 2020-12-26 DIAGNOSIS — N179 Acute kidney failure, unspecified: Secondary | ICD-10-CM | POA: Diagnosis present

## 2020-12-26 DIAGNOSIS — I10 Essential (primary) hypertension: Secondary | ICD-10-CM | POA: Diagnosis present

## 2020-12-26 DIAGNOSIS — Z820 Family history of epilepsy and other diseases of the nervous system: Secondary | ICD-10-CM | POA: Diagnosis not present

## 2020-12-26 DIAGNOSIS — R7989 Other specified abnormal findings of blood chemistry: Secondary | ICD-10-CM | POA: Diagnosis not present

## 2020-12-26 DIAGNOSIS — G8929 Other chronic pain: Secondary | ICD-10-CM | POA: Diagnosis present

## 2020-12-26 DIAGNOSIS — R001 Bradycardia, unspecified: Secondary | ICD-10-CM | POA: Diagnosis not present

## 2020-12-26 DIAGNOSIS — F17203 Nicotine dependence unspecified, with withdrawal: Secondary | ICD-10-CM | POA: Diagnosis present

## 2020-12-26 DIAGNOSIS — D696 Thrombocytopenia, unspecified: Secondary | ICD-10-CM | POA: Diagnosis present

## 2020-12-26 DIAGNOSIS — I69393 Ataxia following cerebral infarction: Secondary | ICD-10-CM

## 2020-12-26 DIAGNOSIS — I129 Hypertensive chronic kidney disease with stage 1 through stage 4 chronic kidney disease, or unspecified chronic kidney disease: Secondary | ICD-10-CM | POA: Diagnosis present

## 2020-12-26 DIAGNOSIS — Z7982 Long term (current) use of aspirin: Secondary | ICD-10-CM | POA: Diagnosis not present

## 2020-12-26 DIAGNOSIS — M545 Low back pain, unspecified: Secondary | ICD-10-CM | POA: Diagnosis present

## 2020-12-26 DIAGNOSIS — F32A Depression, unspecified: Secondary | ICD-10-CM | POA: Diagnosis present

## 2020-12-26 DIAGNOSIS — I6330 Cerebral infarction due to thrombosis of unspecified cerebral artery: Secondary | ICD-10-CM | POA: Diagnosis not present

## 2020-12-26 DIAGNOSIS — M7022 Olecranon bursitis, left elbow: Secondary | ICD-10-CM | POA: Diagnosis present

## 2020-12-26 DIAGNOSIS — R739 Hyperglycemia, unspecified: Secondary | ICD-10-CM | POA: Diagnosis present

## 2020-12-26 DIAGNOSIS — K5901 Slow transit constipation: Secondary | ICD-10-CM | POA: Diagnosis not present

## 2020-12-26 DIAGNOSIS — G8191 Hemiplegia, unspecified affecting right dominant side: Secondary | ICD-10-CM

## 2020-12-26 DIAGNOSIS — G4700 Insomnia, unspecified: Secondary | ICD-10-CM | POA: Diagnosis present

## 2020-12-26 DIAGNOSIS — I639 Cerebral infarction, unspecified: Secondary | ICD-10-CM | POA: Diagnosis not present

## 2020-12-26 DIAGNOSIS — R7301 Impaired fasting glucose: Secondary | ICD-10-CM | POA: Diagnosis present

## 2020-12-26 DIAGNOSIS — I63332 Cerebral infarction due to thrombosis of left posterior cerebral artery: Secondary | ICD-10-CM | POA: Diagnosis not present

## 2020-12-26 DIAGNOSIS — Z720 Tobacco use: Secondary | ICD-10-CM

## 2020-12-26 DIAGNOSIS — I6300 Cerebral infarction due to thrombosis of unspecified precerebral artery: Secondary | ICD-10-CM | POA: Diagnosis not present

## 2020-12-26 DIAGNOSIS — I69351 Hemiplegia and hemiparesis following cerebral infarction affecting right dominant side: Principal | ICD-10-CM

## 2020-12-26 DIAGNOSIS — N189 Chronic kidney disease, unspecified: Secondary | ICD-10-CM | POA: Diagnosis present

## 2020-12-26 MED ORDER — POLYETHYLENE GLYCOL 3350 17 G PO PACK
17.0000 g | PACK | Freq: Every day | ORAL | Status: DC | PRN
  Administered 2020-12-27 – 2021-01-01 (×2): 17 g via ORAL
  Filled 2020-12-26 (×2): qty 1

## 2020-12-26 MED ORDER — FLEET ENEMA 7-19 GM/118ML RE ENEM: 1.0000 | ENEMA | Freq: Once | RECTAL | Status: DC | PRN

## 2020-12-26 MED ORDER — VITAMIN B-12 100 MCG PO TABS
100.0000 ug | ORAL_TABLET | Freq: Every day | ORAL | Status: DC
  Administered 2020-12-27 – 2021-01-12 (×17): 100 ug via ORAL
  Filled 2020-12-26 (×17): qty 1

## 2020-12-26 MED ORDER — MUSCLE RUB 10-15 % EX CREA
TOPICAL_CREAM | Freq: Three times a day (TID) | CUTANEOUS | Status: DC
  Administered 2020-12-28 – 2021-01-11 (×5): 1 via TOPICAL
  Filled 2020-12-26: qty 85

## 2020-12-26 MED ORDER — ALPRAZOLAM 0.5 MG PO TABS
0.5000 mg | ORAL_TABLET | Freq: Four times a day (QID) | ORAL | Status: DC
  Administered 2020-12-26 – 2021-01-12 (×68): 0.5 mg via ORAL
  Filled 2020-12-26 (×68): qty 1

## 2020-12-26 MED ORDER — TRIAZOLAM 0.125 MG PO TABS
0.2500 mg | ORAL_TABLET | Freq: Every day | ORAL | Status: DC
  Administered 2020-12-26 – 2021-01-11 (×17): 0.25 mg via ORAL
  Filled 2020-12-26 (×11): qty 2
  Filled 2020-12-26: qty 1
  Filled 2020-12-26 (×5): qty 2

## 2020-12-26 MED ORDER — PROCHLORPERAZINE 25 MG RE SUPP: 12.5000 mg | Freq: Four times a day (QID) | RECTAL | Status: DC | PRN

## 2020-12-26 MED ORDER — ASPIRIN EC 81 MG PO TBEC
81.0000 mg | DELAYED_RELEASE_TABLET | Freq: Every day | ORAL | Status: DC
  Administered 2020-12-27 – 2021-01-12 (×17): 81 mg via ORAL
  Filled 2020-12-26 (×17): qty 1

## 2020-12-26 MED ORDER — ACETAMINOPHEN 325 MG PO TABS
325.0000 mg | ORAL_TABLET | ORAL | Status: DC | PRN
  Administered 2020-12-26 – 2021-01-09 (×8): 650 mg via ORAL
  Filled 2020-12-26 (×8): qty 2

## 2020-12-26 MED ORDER — TRAZODONE HCL 50 MG PO TABS
50.0000 mg | ORAL_TABLET | Freq: Every day | ORAL | Status: DC
  Administered 2020-12-26 – 2021-01-11 (×17): 50 mg via ORAL
  Filled 2020-12-26 (×18): qty 1

## 2020-12-26 MED ORDER — OMEGA-3-ACID ETHYL ESTERS 1 G PO CAPS
1.0000 g | ORAL_CAPSULE | Freq: Every day | ORAL | Status: DC
  Administered 2020-12-27 – 2021-01-12 (×17): 1 g via ORAL
  Filled 2020-12-26 (×17): qty 1

## 2020-12-26 MED ORDER — ATENOLOL 25 MG PO TABS
25.0000 mg | ORAL_TABLET | Freq: Every day | ORAL | Status: DC
  Administered 2020-12-27 – 2021-01-01 (×6): 25 mg via ORAL
  Filled 2020-12-26 (×6): qty 1

## 2020-12-26 MED ORDER — PROCHLORPERAZINE EDISYLATE 10 MG/2ML IJ SOLN: 5.0000 mg | Freq: Four times a day (QID) | INTRAMUSCULAR | Status: DC | PRN

## 2020-12-26 MED ORDER — ALUM & MAG HYDROXIDE-SIMETH 200-200-20 MG/5ML PO SUSP
30.0000 mL | ORAL | Status: DC | PRN
Start: 2020-12-26 — End: 2021-01-12

## 2020-12-26 MED ORDER — BISACODYL 10 MG RE SUPP: 10.0000 mg | Freq: Every day | RECTAL | Status: DC | PRN

## 2020-12-26 MED ORDER — UMECLIDINIUM-VILANTEROL 62.5-25 MCG/INH IN AEPB
1.0000 | INHALATION_SPRAY | Freq: Every day | RESPIRATORY_TRACT | Status: DC
  Administered 2020-12-27 – 2021-01-12 (×17): 1 via RESPIRATORY_TRACT
  Filled 2020-12-26 (×3): qty 14

## 2020-12-26 MED ORDER — DOCUSATE SODIUM 100 MG PO CAPS
200.0000 mg | ORAL_CAPSULE | Freq: Every day | ORAL | Status: DC
  Administered 2020-12-26 – 2020-12-29 (×4): 200 mg via ORAL
  Filled 2020-12-26 (×5): qty 2

## 2020-12-26 MED ORDER — GUAIFENESIN-DM 100-10 MG/5ML PO SYRP: 5.0000 mL | ORAL_SOLUTION | Freq: Four times a day (QID) | ORAL | Status: DC | PRN

## 2020-12-26 MED ORDER — ACETAMINOPHEN 325 MG PO TABS: 1000.0000 mg | ORAL_TABLET | Freq: Two times a day (BID) | ORAL | Status: DC

## 2020-12-26 MED ORDER — TRAZODONE HCL 50 MG PO TABS: 25.0000 mg | ORAL_TABLET | Freq: Every evening | ORAL | Status: DC | PRN

## 2020-12-26 MED ORDER — DIPHENHYDRAMINE HCL 12.5 MG/5ML PO ELIX: 12.5000 mg | ORAL_SOLUTION | Freq: Four times a day (QID) | ORAL | Status: DC | PRN

## 2020-12-26 MED ORDER — NICOTINE 14 MG/24HR TD PT24
14.0000 mg | MEDICATED_PATCH | Freq: Every day | TRANSDERMAL | Status: DC
  Administered 2020-12-26 – 2021-01-12 (×18): 14 mg via TRANSDERMAL
  Filled 2020-12-26 (×18): qty 1

## 2020-12-26 MED ORDER — PROCHLORPERAZINE MALEATE 5 MG PO TABS: 5.0000 mg | ORAL_TABLET | Freq: Four times a day (QID) | ORAL | Status: DC | PRN

## 2020-12-26 MED ORDER — FLUOXETINE HCL 20 MG PO CAPS
40.0000 mg | ORAL_CAPSULE | Freq: Every day | ORAL | Status: DC
  Administered 2020-12-27 – 2021-01-12 (×17): 40 mg via ORAL
  Filled 2020-12-26 (×17): qty 2

## 2020-12-26 MED ORDER — SIMVASTATIN 20 MG PO TABS
40.0000 mg | ORAL_TABLET | Freq: Every day | ORAL | Status: DC
  Administered 2020-12-26 – 2021-01-11 (×17): 40 mg via ORAL
  Filled 2020-12-26 (×17): qty 2

## 2020-12-26 NOTE — TOC Transition Note (Signed)
Transition of Care [facility]) - CM/SW Discharge Note   Patient Details  Name: Juan Brewer MRN: [id number] Date of Birth: July 10, 1946  Transition of Care [facility]) CM/SW Contact:  Kermit Balo, RN Phone Number: 12/26/2020, 10:22 AM   Clinical Narrative:    Patient is discharging to CIR today. No further needs per TOC.   Final next level of care: IP Rehab Facility Barriers to Discharge: No Barriers Identified   Patient Goals and CMS Choice     Choice offered to / list presented to : Patient  Discharge Placement                       Discharge Plan and Services                                     Social Determinants of Health (SDOH) Interventions     Readmission Risk Interventions No flowsheet data found.

## 2020-12-26 NOTE — Progress Notes (Signed)
Inpatient Rehab Admissions Coordinator:   I have a bed on CIR for this Pt and plan to move him today. Rn may call report to [phone] after 12pm.   Megan Salon, MS, CCC-SLP Rehab Admissions Coordinator  [phone] (celll) [phone] (office)
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
## 2020-12-26 NOTE — Progress Notes (Signed)
Report given to nurse. All questions were answered.

## 2020-12-26 NOTE — Progress Notes (Signed)
INPATIENT REHABILITATION ADMISSION NOTE   Arrival Method: Wheelchair     Mental Orientation: A+Ox4   Assessment: See flowsheets   Skin: See flow sheets   IV'S: 2 AC IV- removed per pt request   Pain: 0-10 (5)   Tubes and Drains: none   Safety Measures: bed rails x3, call light in reach, safety measures implemented and educated pt. No slip socks.   Vital Signs: Obtained. See vitals   Height and Weight: see vitals   Rehab Orientation: Policies reviewed, pt oriented to rehab.   Family: not present, family notified by pt.    Mylo Red, LPN

## 2020-12-26 NOTE — Progress Notes (Signed)
Occupational Therapy Treatment Patient Details Name: Juan Brewer MRN: [id number] DOB: 08-Sep-1945 Today's Date: 12/26/2020    History of present illness 75 y.o. male with history of hypertension, hyperlipidemia, tobacco abuse, COPD started experiencing right facial droop.  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   OT comments  Patient continues to work hard toward patient focused OT goals.  Patient needed a little more assist for sitting balance this date.  Noted patient leaning back and to the left, needing cueing to lean forward.  Patient able to donn underwear and shorts with increased time, effort and Min A.  Patient placing legs in the the same leg hole, cueing to lean forward so he can see what he's doing.  He is scheduled to transition to CIR today, acute OT will continue to follow him as long as he remains.      Follow Up Recommendations  CIR;Supervision - Intermittent    Equipment Recommendations  Tub/shower seat;3 in 1 bedside commode    Recommendations for Other Services Rehab consult    Precautions / Restrictions Precautions Precautions: Fall Restrictions Weight Bearing Restrictions: No       Mobility Bed Mobility Overal bed mobility: Needs Assistance Bed Mobility: Supine to Sit     Supine to sit: Mod assist;Min assist     General bed mobility comments: difficulty bringing hip forward, initially unable to maintain midline sitting. Patient Response: Impulsive  Transfers Overall transfer level: Needs assistance   Transfers: Sit to/from Stand;Stand Pivot Transfers Sit to Stand: Min assist Stand pivot transfers: Min assist            Balance Overall balance assessment: Needs assistance Sitting-balance support: Single extremity supported;Feet supported Sitting balance-Leahy Scale: Poor   Postural control: Posterior lean;Left lateral lean Standing balance support: Bilateral upper extremity supported Standing  balance-Leahy Scale: Poor                             ADL either performed or assessed with clinical judgement   ADL Overall ADL's : Needs assistance/impaired     Grooming: Oral care;Brushing hair;Standing;Min guard               Lower Body Dressing: Minimal assistance;Moderate assistance;Sit to/from stand Lower Body Dressing Details (indicate cue type and reason): decreased sitting balance and insight to deficits             Functional mobility during ADLs: Minimal assistance;Moderate assistance;Rolling walker General ADL Comments: RW management and difficulty with transitional movements.     Vision Patient Visual Report: No change from baseline     Perception     Praxis      Cognition Arousal/Alertness: Awake/alert Behavior During Therapy: WFL for tasks assessed/performed Overall Cognitive Status: Impaired/Different from baseline Area of Impairment: Safety/judgement                         Safety/Judgement: Decreased awareness of safety;Decreased awareness of deficits   Problem Solving: Requires verbal cues;Difficulty sequencing          Exercises     Shoulder Instructions       General Comments      Pertinent Vitals/ Pain       Faces Pain Scale: Hurts a little bit Pain Location: low back Pain Descriptors / Indicators: Sore Pain Intervention(s): Monitored during session  Frequency  Min 2X/week        Progress Toward Goals  OT Goals(current goals can now be found in the care plan section)  Progress towards OT goals: Progressing toward goals  Acute Rehab OT Goals Patient Stated Goal: get back to independence and work OT Goal Formulation: With patient Time For Goal Achievement: 01/05/21 Potential to Achieve Goals: Good  Plan Discharge plan remains appropriate    Co-evaluation                 AM-PAC OT "6 Clicks" Daily  Activity     Outcome Measure   Help from another person eating meals?: None Help from another person taking care of personal grooming?: A Little Help from another person toileting, which includes using toliet, bedpan, or urinal?: A Little Help from another person bathing (including washing, rinsing, drying)?: A Lot Help from another person to put on and taking off regular upper body clothing?: A Little Help from another person to put on and taking off regular lower body clothing?: A Lot 6 Click Score: 17    End of Session Equipment Utilized During Treatment: Gait belt;Rolling walker  OT Visit Diagnosis: Unsteadiness on feet (R26.81);Muscle weakness (generalized) (M62.81);Hemiplegia and hemiparesis Hemiplegia - Right/Left: Right Hemiplegia - dominant/non-dominant: Dominant Hemiplegia - caused by: Cerebral infarction   Activity Tolerance Patient tolerated treatment well   Patient Left in chair;with call bell/phone within reach;with chair alarm set   Nurse Communication Mobility status        Time: 1700-1749 OT Time Calculation (min): 20 min  Charges: OT General Charges $OT Visit: 1 Visit OT Treatments $Self Care/Home Management : 8-22 mins  12/26/2020  Rich, OTR/L  Acute Rehabilitation Services  Office:  [phone]    Suzanna Obey 12/26/2020, 11:41 AM

## 2020-12-27 DIAGNOSIS — I63332 Cerebral infarction due to thrombosis of left posterior cerebral artery: Secondary | ICD-10-CM

## 2020-12-27 LAB — CBC WITH DIFFERENTIAL/PLATELET
Abs Immature Granulocytes: 0.02 10*3/uL (ref 0.00–0.07)
Basophils Absolute: 0.1 10*3/uL (ref 0.0–0.1)
Basophils Relative: 1 %
Eosinophils Absolute: 0.1 10*3/uL (ref 0.0–0.5)
Eosinophils Relative: 1 %
HCT: 40.4 % (ref 39.0–52.0)
Hemoglobin: 14.2 g/dL (ref 13.0–17.0)
Immature Granulocytes: 0 %
Lymphocytes Relative: 30 %
Lymphs Abs: 2.1 10*3/uL (ref 0.7–4.0)
MCH: 31.6 pg (ref 26.0–34.0)
MCHC: 35.1 g/dL (ref 30.0–36.0)
MCV: 89.8 fL (ref 80.0–100.0)
Monocytes Absolute: 0.6 10*3/uL (ref 0.1–1.0)
Monocytes Relative: 9 %
Neutro Abs: 4.2 10*3/uL (ref 1.7–7.7)
Neutrophils Relative %: 59 %
Platelets: 92 10*3/uL — ABNORMAL LOW (ref 150–400)
RBC: 4.5 MIL/uL (ref 4.22–5.81)
RDW: 12.4 % (ref 11.5–15.5)
WBC: 7.1 10*3/uL (ref 4.0–10.5)
nRBC: 0 % (ref 0.0–0.2)

## 2020-12-27 LAB — COMPREHENSIVE METABOLIC PANEL
ALT: 16 U/L (ref 0–44)
AST: 18 U/L (ref 15–41)
Albumin: 3.5 g/dL (ref 3.5–5.0)
Alkaline Phosphatase: 43 U/L (ref 38–126)
Anion gap: 8 (ref 5–15)
BUN: 25 mg/dL — ABNORMAL HIGH (ref 8–23)
CO2: 25 mmol/L (ref 22–32)
Calcium: 9.8 mg/dL (ref 8.9–10.3)
Chloride: 103 mmol/L (ref 98–111)
Creatinine, Ser: 1.36 mg/dL — ABNORMAL HIGH (ref 0.61–1.24)
GFR, Estimated: 55 mL/min — ABNORMAL LOW (ref >60)
Glucose, Bld: 111 mg/dL — ABNORMAL HIGH (ref 70–99)
Potassium: 3.6 mmol/L (ref 3.5–5.1)
Sodium: 136 mmol/L (ref 135–145)
Total Bilirubin: 0.6 mg/dL (ref 0.3–1.2)
Total Protein: 6.3 g/dL — ABNORMAL LOW (ref 6.5–8.1)

## 2020-12-27 MED ORDER — LIDOCAINE 5 % EX PTCH
1.0000 | MEDICATED_PATCH | CUTANEOUS | Status: DC
  Administered 2020-12-27 – 2021-01-11 (×15): 1 via TRANSDERMAL
  Filled 2020-12-27 (×14): qty 1

## 2020-12-27 NOTE — Evaluation (Signed)
Occupational Therapy Assessment and Plan  Patient Details  Name: Juan Brewer MRN: [id number] Date of Birth: 12-11-1945  OT Diagnosis: cognitive deficits and hemiplegia affecting dominant side Rehab Potential: Rehab Potential (ACUTE ONLY): Good ELOS: 14-18 days   Today's Date: 12/27/2020 OT Individual Time: 1100-1205 OT Individual Time Calculation (min): 65 min     Hospital Problem: Active Problems:   CVA (cerebral vascular accident) [facility])   Past Medical History:  Past Medical History:  Diagnosis Date  . Hypertension    Past Surgical History:  Past Surgical History:  Procedure Laterality Date  . CHOLECYSTECTOMY      Assessment & Plan Clinical Impression:  Juan Brewer is a 75 year old male with history of HTN, COPD, syncope w/TIA event 01/22 who was admitted on 12/21/2020 with right facial droop and difficulty walking. History taken from chart review and patient. UDS was positive for benzos. 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 MRI report showed left lentiform nucleus/corona radiata and moderate chronic microvascular ischemic disease. Echocardiogram ejection fraction of 60 to 65%, negative for cardiac pathology. Patient had been on aspirin every other day with daily Plavix due to question of cystic lesion versus hemarthrosis left elbow which had been recently aspirated. He was found to be thrombocytopenia at admission with platelet 96. Dr. Erlinda Hong felt the stroke was due to small vessel disease and recommended decreasing DVT daily low-dose aspirin and close follow-up with PCP after discharge. Therapy evaluations were done revealing deficits due to right-sided hemiparesis with decreased balance as well as ataxic RLE. CIR was recommended due to functional decline. Please see preadmission assessment from earlier today as well.   Patient transferred to CIR on 12/26/2020 .    Patient currently requires min with basic self-care skills  secondary to unbalanced muscle activation and decreased coordination, decreased attention to left, decreased awareness, decreased problem solving, decreased memory and delayed processing and decreased sitting balance, decreased standing balance, decreased postural control and hemiplegia.  Prior to hospitalization, patient was fully independent.  Patient will benefit from skilled intervention to increase independence with basic self-care skills prior to discharge home with care partner.  Anticipate patient will require 24 hour supervision and follow up outpatient.  OT - End of Session Activity Tolerance: Tolerates 30+ min activity with multiple rests Endurance Deficit: Yes Endurance Deficit Description: pt reported fatigue with activity OT Assessment Rehab Potential (ACUTE ONLY): Good OT Patient demonstrates impairments in the following area(s): Balance OT Basic ADL's Functional Problem(s): Bathing;Dressing;Toileting;Grooming;Eating OT Transfers Functional Problem(s): Toilet;Tub/Shower OT Additional Impairment(s): Fuctional Use of Upper Extremity OT Plan OT Intensity: Minimum of 1-2 x/day, 45 to 90 minutes OT Frequency: 5 out of 7 days OT Duration/Estimated Length of Stay: 14-18 days OT Treatment/Interventions: Balance/vestibular training;Cognitive remediation/compensation;Discharge planning;Functional mobility training;DME/adaptive equipment instruction;Neuromuscular re-education;Patient/family education;Psychosocial support;Therapeutic Activities;Self Care/advanced ADL retraining;Therapeutic Exercise;UE/LE Strength taining/ROM;UE/LE Coordination activities OT Self Feeding Anticipated Outcome(s): independent OT Basic Self-Care Anticipated Outcome(s): supervision OT Toileting Anticipated Outcome(s): supervision OT Bathroom Transfers Anticipated Outcome(s): supervision OT Recommendation Patient destination: Home Follow Up Recommendations: Home health OT Equipment Recommended: Tub/shower  bench;3 in 1 bedside comode   OT Evaluation Precautions/Restrictions  Precautions Precautions: Fall Precaution Comments: R hemi, mild R inattention Restrictions Weight Bearing Restrictions: No   Pain Pain Assessment Pain Scale: 0-10 Pain Score: 4  Pain Type: Chronic pain Pain Location: Back Pain Orientation: Lower Pain Intervention(s): Medication (See eMAR) Home Living/Prior Functioning Home Living Family/patient expects to be discharged to:: Private residence Living  Arrangements: Spouse/significant other,Children,Other relatives Available Help at Discharge: Family,Available 24 hours/day (lives with wife, son, daughter in law, and 3 children) Type of Home: House Home Access: Stairs to enter Technical brewer of Steps: 1 Entrance Stairs-Rails: None Home Layout: Two level,Able to live on main level with bedroom/bathroom Alternate Level Stairs-Number of Steps: full flight; but pt reports he does not have to go up/down them Bathroom Shower/Tub: Optometrist: Yes  Lives With: Spouse Prior Function Level of Independence: Independent with basic ADLs,Independent with gait,Independent with homemaking with ambulation,Independent with transfers  Able to Take Stairs?: Yes Driving: Yes Vocation: Full time employment Vocation Requirements: owns real estate business. Vision Baseline Vision/History: Wears glasses Wears Glasses: Reading only Patient Visual Report: No change from baseline Ocular Range of Motion: Within Functional Limits Alignment/Gaze Preference: Within Defined Limits Visual Fields: No apparent deficits Perception  Perception: Impaired Inattention/Neglect: Does not attend to left side of body;Other (comment) (postural loss to L) Praxis Praxis: Intact Cognition Overall Cognitive Status: Within Functional Limits for tasks assessed Arousal/Alertness: Awake/alert Orientation Level:  Person;Place;Situation Person: Oriented Place: Oriented Situation: Oriented Year: 2022 Month: June Day of Week: Incorrect (Wednesday) Memory: Appears intact Immediate Memory Recall: Sock;Blue;Bed Memory Recall Sock: Without Cue Memory Recall Blue: Without Cue Memory Recall Bed: Without Cue Awareness: Impaired Awareness Impairment: Intellectual impairment;Emergent impairment Problem Solving: Impaired Safety/Judgment: Impaired Comments: decreased insight into deficits Sensation Sensation Light Touch: Appears Intact Hot/Cold: Appears Intact Proprioception: Impaired by gross assessment Stereognosis: Impaired by gross assessment Coordination Gross Motor Movements are Fluid and Coordinated: No Fine Motor Movements are Fluid and Coordinated:  (difficulty with writing and tying shoes) Coordination and Movement Description: grossly uncoordinated due to R hemi, decreased balance/postural control, generalized weakness, poor motor planning/sequencing, and decreased insight into deficits. Finger Nose Finger Test: slow on LUE, dysmetria on RUE Heel Shin Test: [facility] bilaterally Motor  Motor Motor: Hemiplegia;Abnormal postural alignment and control Motor - Skilled Clinical Observations: grossly uncoordinated due to R hemi, decreased balance/postural control, generalized weakness, poor motor planning/sequencing, and decreased insight into deficits.  Trunk/Postural Assessment  Cervical Assessment Cervical Assessment: Exceptions to [facility] (forward head) Thoracic Assessment Thoracic Assessment: Exceptions to [facility] (rounded shoulders) Lumbar Assessment Lumbar Assessment: Exceptions to [facility] (posterior pelvic tilt) Postural Control Postural Control: Deficits on evaluation Trunk Control: R lateral and posterior lean; during OT session L lean (may fluctuate) Protective Responses: delayed  Balance Balance Balance Assessed: Yes Static Sitting Balance Static Sitting - Balance Support: Feet  supported;Bilateral upper extremity supported Static Sitting - Level of Assistance: 5: Stand by assistance (supervision) Dynamic Sitting Balance Dynamic Sitting - Balance Support: Feet supported;Left upper extremity supported Dynamic Sitting - Level of Assistance: 5: Stand by assistance (CGA) Static Standing Balance Static Standing - Balance Support: Left upper extremity supported (handrail) Static Standing - Level of Assistance: 4: Min assist Dynamic Standing Balance Dynamic Standing - Balance Support: Left upper extremity supported (handrail) Dynamic Standing - Level of Assistance: 1: +2 Total assist Dynamic Standing - Comments: with gait Extremity/Trunk Assessment RUE Assessment Passive Range of Motion (PROM) Comments: WFL Active Range of Motion (AROM) Comments: slight limitation in sh flexion overhead General Strength Comments: 4-/5 LUE Assessment LUE Assessment: Within Functional Limits  Care Tool Care Tool Self Care Eating   Eating Assist Level: Set up assist    Oral Care    Oral Care Assist Level: Set up assist    Bathing   Body parts bathed by patient: Right arm;Left arm;Chest;Abdomen;Buttocks;Right upper leg;Left upper leg;Right lower leg;Left  lower leg;Front perineal area;Face     Assist Level: Contact Guard/Touching assist    Upper Body Dressing(including orthotics)   What is the patient wearing?: Pull over shirt   Assist Level: Supervision/Verbal cueing    Lower Body Dressing (excluding footwear)   What is the patient wearing?: Underwear/pull up;Pants Assist for lower body dressing: Minimal Assistance - Patient > 75%    Putting on/Taking off footwear   What is the patient wearing?: Ted hose;Shoes Assist for footwear: Moderate Assistance - Patient 50 - 74%       Care Tool Toileting Toileting activity   Assist for toileting: Minimal Assistance - Patient > 75%     Care Tool Bed Mobility Roll left and right activity   Roll left and right assist level:  Supervision/Verbal cueing    Sit to lying activity   Sit to lying assist level: Supervision/Verbal cueing    Lying to sitting edge of bed activity   Lying to sitting edge of bed assist level: Supervision/Verbal cueing     Care Tool Transfers Sit to stand transfer   Sit to stand assist level: Moderate Assistance - Patient 50 - 74%    Chair/bed transfer   Chair/bed transfer assist level: Moderate Assistance - Patient 50 - 74%     Toilet transfer   Assist Level: Minimal Assistance - Patient > 75%     Care Tool Cognition Expression of Ideas and Wants Expression of Ideas and Wants: Without difficulty (complex and basic) - expresses complex messages without difficulty and with speech that is clear and easy to understand   Understanding Verbal and Non-Verbal Content Understanding Verbal and Non-Verbal Content: Understands (complex and basic) - clear comprehension without cues or repetitions   Memory/Recall Ability *first 3 days only Memory/Recall Ability *first 3 days only: Current season;Location of own room;That he or she is in a hospital/hospital unit;Staff names and faces    Refer to Care Plan for Long Term Goals  SHORT TERM GOAL WEEK 1 OT Short Term Goal 1 (Week 1): Pt will demonstrate improved RUE coordination to complete eating independently, opening containers himself. OT Short Term Goal 2 (Week 1): Pt will maintain sitting balance EOB with no lateral lean while donning clothing with no cues. OT Short Term Goal 3 (Week 1): Pt will don underwear and pants with CGA for balance only. OT Short Term Goal 4 (Week 1): Pt will be able to don and tie shoes independently.  Recommendations for other services: None    Skilled Therapeutic Intervention ADL ADL Eating: Set up Grooming: Setup Upper Body Bathing: Supervision/safety Where Assessed-Upper Body Bathing: Shower Lower Body Bathing: Contact guard Where Assessed-Lower Body Bathing: Shower Upper Body Dressing:  Supervision/safety Lower Body Dressing: Minimal assistance Where Assessed-Lower Body Dressing: Wheelchair Toileting: Minimal assistance Where Assessed-Toileting: Glass blower/designer: Psychiatric nurse Method: Engineer, water: Grab bars;Raised Counselling psychologist: Environmental education officer Method: Education officer, environmental: Transfer tub bench;Grab bars Mobility  Bed Mobility Bed Mobility: Rolling Right;Rolling Left;Sit to Supine;Supine to Sit Rolling Right: Supervision/verbal cueing Rolling Left: Supervision/Verbal cueing Supine to Sit: Supervision/Verbal cueing Sit to Supine: Supervision/Verbal cueing Transfers Sit to Stand: Moderate Assistance - Patient 50-74% Stand to Sit: Minimal Assistance - Patient > 75%   Pt seen for initial evaluation and ADL training with a focus on balance and postural control.  Pt has an interesting CVA presentation. He has decreased RUE [place] and motor control, but he demonstrated decreased L side  awareness as he would frequently lean to the L not demonstrating awareness and needed frequent cues to self correct posture.  Overall, he completed self care at a min A level due to balance, posture and R side incoordination.  Discussed goals, ELOS, and OT POC.  Pt resting in wc with belt alarm and all needs met.    Discharge Criteria: Patient will be discharged from OT if patient refuses treatment 3 consecutive times without medical reason, if treatment goals not met, if there is a change in medical status, if patient makes no progress towards goals or if patient is discharged from hospital.  The above assessment, treatment plan, treatment alternatives and goals were discussed and mutually agreed upon: by patient  [facility] 12/27/2020, 12:47 PM

## 2020-12-27 NOTE — Progress Notes (Signed)
Inpatient Rehabilitation  Patient information reviewed and entered into eRehab system by Daleigh Pollinger Quindarrius [place], OTR/L.   Information including medical coding, functional ability and quality indicators will be reviewed and updated through discharge.    

## 2020-12-27 NOTE — Progress Notes (Signed)
Inpatient Rehabilitation Center Individual Statement of Services  Patient Name:  Rodgers Likes  Date:  12/27/2020  Welcome to the Inpatient Rehabilitation Center.  Our goal is to provide you with an individualized program based on your diagnosis and situation, designed to meet your specific needs.  With this comprehensive rehabilitation program, you will be expected to participate in at least 3 hours of rehabilitation therapies Monday-Friday, with modified therapy programming on the weekends.  Your rehabilitation program will include the following services:  Physical Therapy (PT), Occupational Therapy (OT), Speech Therapy (ST), 24 hour per day rehabilitation nursing, Neuropsychology, Care Coordinator, Rehabilitation Medicine, Nutrition Services and Pharmacy Services  Weekly team conferences will be held on Tuesday to discuss your progress.  Your Inpatient Rehabilitation Care Coordinator will talk with you frequently to get your input and to update you on team discussions.  Team conferences with you and your family in attendance may also be held.  Expected length of stay: 14-18 days  Overall anticipated outcome: supervision-min assist level  Depending on your progress and recovery, your program may change. Your Inpatient Rehabilitation Care Coordinator will coordinate services and will keep you informed of any changes. Your Inpatient Rehabilitation Care Coordinator's name and contact numbers are listed  below.  The following services may also be recommended but are not provided by the Inpatient Rehabilitation Center:   Driving Evaluations  Home Health Rehabiltiation Services  Outpatient Rehabilitation Services  Vocational Rehabilitation   Arrangements will be made to provide these services after discharge if needed.  Arrangements include referral to agencies that provide these services.  Your insurance has been verified to be:  Medicare A & B Your primary doctor is:  [facility]  Pertinent information will be shared with your doctor and your insurance company.  Inpatient Rehabilitation Care Coordinator:  Dossie Der, [place] [phone] or Luna Glasgow  Information discussed with and copy given to patient by: Lucy Chris, 12/27/2020, 11:52 AM

## 2020-12-27 NOTE — Progress Notes (Signed)
Small skin tear noted to L elbow. Pt bumped elbow during therapy this AM. Foam applied to affected area. Mylo Red, LPN

## 2020-12-27 NOTE — Progress Notes (Signed)
Physical Therapy Session Note  Patient Details  Name: Juan Brewer MRN: [id number] Date of Birth: 1945-09-12  Today's Date: 12/27/2020 PT Individual Time: 1300-1356 PT Individual Time Calculation (min): 56 min   Short Term Goals: Week 1:  PT Short Term Goal 1 (Week 1): pt will transfer sit<>stand with LRAD and CGA PT Short Term Goal 2 (Week 1): pt will transfer bed<>chair with LRAD and CGA PT Short Term Goal 3 (Week 1): pt will ambulate 46ft with LRAD and max of 1  Skilled Therapeutic Interventions/Progress Updates:   Received pt sitting in WC, pt agreeable to PT treatment, and denied any pain during session. Session with emphasis on functional mobility/transfers, generalized strengthening, dynamic standing balance/coordination, gait training, NMR, and improved activity tolerance. Assisted pt in making phone call as pt struggling to type numbers with RUE. Pt transported to ortho gym in [facility] total A for time management purposes. Sit<>stand with RW and min/mod A and ambulated 64ft x 2 trials with RW and max A.  Pt required cues to increase RLE step length and slight manual facilitation for foot placement and to guard R knee. Stand<>pivot WC<>mat with RW and min A with cues for stepping sequencing, foot placement, and RW safety. Pt performed the following exercises sitting on mat with supervision and verbal cues for technique with emphasis on core control as pt with posterior and L lean requiring intermittent cues but no physical assist to correct: -hip flexion x15 bilaterally -LAQ x15 bilaterally -hip adduction ball squeezes 2x10 Sit<>stand with RW and min A and worked on dynamic standing balance performing LLE toe taps to 3in steps 3x10 with mod A fading to min A for balance with emphasis on R lateral weight shifting, R knee extension, and upright posture. Pt then performed standing mini-squats 3x8 with BUE support on RW and min A for balance; pt demonstrating good knee control but therapist  guarding for safety. Stand<>pivot mat<>WC with RW and min A and transported back to room in [facility] total A. Concluded session with pt sitting in WC, needs within reach, and seatbelt alarm on.   Therapy Documentation Precautions:  Restrictions Weight Bearing Restrictions: No  Therapy/Group: Individual Therapy Martin Majestic PT, DPT   12/27/2020, 7:28 AM

## 2020-12-27 NOTE — Evaluation (Signed)
Physical Therapy Assessment and Plan  Patient Details  Name: Juan Brewer MRN: [id number] Date of Birth: 12-09-1945  PT Diagnosis: Abnormal posture, Abnormality of gait, Coordination disorder, Difficulty walking, Hemiparesis dominant, Low back pain and Muscle weakness Rehab Potential: Good ELOS: 14-18 days   Today's Date: 12/27/2020 PT Individual Time: 0800-0910 PT Individual Time Calculation (min): 70 min    Hospital Problem: Active Problems:   CVA (cerebral vascular accident) [facility])   Past Medical History:  Past Medical History:  Diagnosis Date  . Hypertension    Past Surgical History:  Past Surgical History:  Procedure Laterality Date  . CHOLECYSTECTOMY      Assessment & Plan Clinical Impression: Patient is a 75 y.o. year old male with history of HTN, COPD, syncope-did TIA event 07/2020; who was admitted on 12/21/2020 with right facial droop and difficulty walking.  UDS positive for benzos.  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  2D echo showed EF 60 to 65% and was negative for cardiac SOE.  Patient on ASA every other day with daily Plavix due to question of cystic lesion/hemarthrosis left elbow as well as thrombocytopenia/anemia at admission.  Dr. Erlinda Hong felt that stroke was due to small vessel disease and recommended decreasing DAPT to daily low-dose ASA with close follow-up with PCP. Therapy evaluation completed revealing weakness with balance deficits and CIR recommended due to functional decline.  Patient currently requires mod with mobility secondary to muscle weakness, decreased cardiorespiratoy endurance, impaired timing and sequencing, abnormal tone, unbalanced muscle activation, decreased  coordination and decreased motor planning, decreased attention to right and decreased motor planning and decreased sitting balance, decreased standing balance, decreased postural control, hemiplegia and decreased balance strategies.  Prior to hospitalization, patient was independent  with mobility and lived with Spouse in a House home.  Home access is 1Stairs to enter.  Patient will benefit from skilled PT intervention to maximize safe functional mobility, minimize fall risk and decrease caregiver burden for planned discharge home with 24 hour supervision.  Anticipate patient will benefit from follow up [place] at discharge.  PT - End of Session Activity Tolerance: Tolerates 30+ min activity with multiple rests Endurance Deficit: Yes Endurance Deficit Description: pt reported fatigue with activity PT Assessment Rehab Potential (ACUTE/IP ONLY): Good PT Barriers to Discharge: Inaccessible home environment;Home environment access/layout PT Barriers to Discharge Comments: 1 STE with 0 rails and 1 flight of steps to get upstairs; but can stay on main level. PT Patient demonstrates impairments in the following area(s): Balance;Endurance;Motor;Perception;Safety PT Transfers Functional Problem(s): Bed Mobility;Bed to Chair;Car;Furniture PT Locomotion Functional Problem(s): Ambulation;Wheelchair Mobility;Stairs PT Plan PT Intensity: Minimum of 1-2 x/day ,45 to 90 minutes PT Frequency: 5 out of 7 days PT Duration Estimated Length of Stay: 14-18 days PT Treatment/Interventions: Ambulation/gait training;Discharge planning;Functional mobility training;Psychosocial support;Therapeutic Activities;Balance/vestibular training;Disease management/prevention;Neuromuscular re-education;Skin care/wound management;Therapeutic Exercise;Wheelchair propulsion/positioning;Cognitive remediation/compensation;DME/adaptive equipment instruction;Pain management;Splinting/orthotics;UE/LE Strength taining/ROM;Community  reintegration;Functional electrical stimulation;Patient/family education;Stair training;UE/LE Coordination activities PT Transfers Anticipated Outcome(s): supervision PT Locomotion Anticipated Outcome(s): min A with LRAD PT Recommendation Follow Up Recommendations: Home health PT Patient destination: Home Equipment Recommended: To be determined Equipment Details: has none   PT Evaluation Precautions/Restrictions Precautions Precautions: Fall Precaution Comments: R hemi, mild R inattention Restrictions Weight Bearing Restrictions: No Home Living/Prior Functioning Home Living Living Arrangements: Spouse/significant other;Children;Other relatives  Available Help at Discharge: Family;Available 24 hours/day (lives with wife, son, daughter in law, and 3 children) Type of Home: House Home Access: Stairs to enter Technical brewer of Steps: 1 Entrance Stairs-Rails: None Home Layout: Two level;Able to live on main level with bedroom/bathroom Alternate Level Stairs-Number of Steps: full flight; but pt reports he does not have to go up/down them Bathroom Shower/Tub: Chiropodist: Standard Bathroom Accessibility: Yes  Lives With: Spouse Prior Function Level of Independence: Independent with basic ADLs;Independent with gait;Independent with homemaking with ambulation;Independent with transfers  Able to Take Stairs?: Yes Driving: Yes Vocation: Full time employment Vocation Requirements: owns real estate business. Cognition Overall Cognitive Status: Within Functional Limits for tasks assessed Arousal/Alertness: Awake/alert Orientation Level: Oriented X4 Memory: Appears intact Awareness: Impaired Problem Solving: Impaired Safety/Judgment: Impaired Comments: decreased insight into deficits Sensation Sensation Light Touch: Appears Intact Proprioception: Impaired by gross assessment Coordination Gross Motor Movements are Fluid and Coordinated: No Fine Motor  Movements are Fluid and Coordinated: No Coordination and Movement Description: grossly uncoordinated due to R hemi, decreased balance/postural control, generalized weakness, poor motor planning/sequencing, and decreased insight into deficits. Finger Nose Finger Test: slow on LUE, dysmetria on RUE Heel Shin Test: [facility] bilaterally Motor  Motor Motor: Hemiplegia;Abnormal postural alignment and control Motor - Skilled Clinical Observations: grossly uncoordinated due to R hemi, decreased balance/postural control, generalized weakness, poor motor planning/sequencing, and decreased insight into deficits.  Trunk/Postural Assessment  Cervical Assessment Cervical Assessment: Exceptions to [facility] (forward head) Thoracic Assessment Thoracic Assessment: Exceptions to [facility] (rounded shoulders) Lumbar Assessment Lumbar Assessment: Exceptions to [facility] (posterior pelvic tilt) Postural Control Postural Control: Deficits on evaluation Trunk Control: R lateral and posterior lean Protective Responses: delayed  Balance Balance Balance Assessed: Yes Static Sitting Balance Static Sitting - Balance Support: Feet supported;Bilateral upper extremity supported Static Sitting - Level of Assistance: 5: Stand by assistance (supervision) Dynamic Sitting Balance Dynamic Sitting - Balance Support: Feet supported;Left upper extremity supported Dynamic Sitting - Level of Assistance: 5: Stand by assistance (CGA) Static Standing Balance Static Standing - Balance Support: Left upper extremity supported (handrail) Static Standing - Level of Assistance: 4: Min assist Dynamic Standing Balance Dynamic Standing - Balance Support: Left upper extremity supported (handrail) Dynamic Standing - Level of Assistance: 1: +2 Total assist Dynamic Standing - Comments: with gait Extremity Assessment  RLE Assessment RLE Assessment: Exceptions to [facility] General Strength Comments: grossly generalized to 3+/5 LLE Assessment LLE Assessment:  Exceptions to [facility] General Strength Comments: grossly generalized to 4/5  Care Tool Care Tool Bed Mobility Roll left and right activity   Roll left and right assist level: Supervision/Verbal cueing    Sit to lying activity   Sit to lying assist level: Supervision/Verbal cueing    Lying to sitting edge of bed activity   Lying to sitting edge of bed assist level: Supervision/Verbal cueing     Care Tool Transfers Sit to stand transfer   Sit to stand assist level: Moderate Assistance - Patient 50 - 74%    Chair/bed transfer   Chair/bed transfer assist level: Moderate Assistance - Patient 50 - 74%     Physiological scientist transfer assist level: Maximal Assistance - Patient 25 - 49%      Care Tool Locomotion Ambulation   Assist level: 2 helpers Assistive device: Other (comment) (L rail) Max distance: 71f  Walk 10 feet activity Walk 10 feet activity did not occur: Safety/medical concerns (fatigue, decreased  balance, R hemi, decreased motor planning/coordination)       Walk 50 feet with 2 turns activity Walk 50 feet with 2 turns activity did not occur: Safety/medical concerns (fatigue, decreased balance, R hemi, decreased motor planning/coordination)      Walk 150 feet activity Walk 150 feet activity did not occur: Safety/medical concerns (fatigue, decreased balance, R hemi, decreased motor planning/coordination)      Walk 10 feet on uneven surfaces activity Walk 10 feet on uneven surfaces activity did not occur: Safety/medical concerns      Stairs Stair activity did not occur: Safety/medical concerns (fatigue, decreased balance, R hemi, decreased motor planning/coordination)        Walk up/down 1 step activity Walk up/down 1 step or curb (drop down) activity did not occur: Safety/medical concerns (fatigue, decreased balance, R hemi, decreased motor planning/coordination)     Walk up/down 4 steps activity did not occuR: Safety/medical concerns  (fatigue, decreased balance, R hemi, decreased motor planning/coordination)  Walk up/down 4 steps activity      Walk up/down 12 steps activity Walk up/down 12 steps activity did not occur: Safety/medical concerns (fatigue, decreased balance, R hemi, decreased motor planning/coordination)      Pick up small objects from floor Pick up small object from the floor (from standing position) activity did not occur: Safety/medical concerns (fatigue, decreased balance, R hemi, decreased motor planning/coordination)      Wheelchair Will patient use wheelchair at discharge?: Yes Type of Wheelchair: Manual   Wheelchair assist level: Supervision/Verbal cueing Max wheelchair distance: 13f  Wheel 50 feet with 2 turns activity   Assist Level: Supervision/Verbal cueing  Wheel 150 feet activity   Assist Level: Maximal Assistance - Patient 25 - 49%    Refer to Care Plan for Long Term Goals  SHORT TERM GOAL WEEK 1 PT Short Term Goal 1 (Week 1): pt will transfer sit<>stand with LRAD and CGA PT Short Term Goal 2 (Week 1): pt will transfer bed<>chair with LRAD and CGA PT Short Term Goal 3 (Week 1): pt will ambulate 161fwith LRAD and max of 1  Recommendations for other services: None   Skilled Therapeutic Intervention Evaluation completed (see details above and below) with education on PT POC and goals and individual treatment initiated with focus on functional mobility/transfers, generalized strengthening, dynamic standing balance/coordination, ambulation, simulated car transfers, and improved activity tolerance. Received pt supine in bed, pt educated on PT evaluation, CIR policies, and therapy schedule and agreeable. Pt reported mild back pain 1/10 (premedicated). Provided pt with 18x18 WC but no cushion available. Pt transferred supine<>sitting EOB and sit<>supine from flat bed with supervision and use of bedrails with cues as pt demonstrated R lateral lean. Pt with continued R lateral/posterior lean in  sitting but able to correct without assist and with verbal cues. Donned ted hose with total A and shoes with max A and pt transferred bed<>WC stand<>pivot without AD and mod A. Pt performed WC mobility 7530fsing BLE and supervision to ortho gym with cues for attention to RLE and pt's R leg frequently getting stuck underneath WC. Pt performed simulated car transfer with mod A to L to enter and max A to R to exit with therapist guarding R knee. Sit<>stand with L rail in hallway with mod A and pt ambulated [id number] L handrail and mod/max A +2 for very close WC follow. Pt with bilateral knees flexed in stance and posterior lean. Pt unable to extend knees of come into upright posture despite cues and  required manual facilitation to advance and place RLE. Noted pt's L elbow bleeding therefore stopped ambulating and returned to room. Notified RN and pt requested to brush teeth. Pt sat in [facility] and brushed teeth with supervision. Pt then transferred WC<>recliner stand<>pivot with RW and min/mod A. Concluded session with pt sitting in recliner, needs within reach, and seatbelt alarm on. Safety plan updated.   Mobility Bed Mobility Bed Mobility: Rolling Right;Rolling Left;Sit to Supine;Supine to Sit Rolling Right: Supervision/verbal cueing Rolling Left: Supervision/Verbal cueing Supine to Sit: Supervision/Verbal cueing Sit to Supine: Supervision/Verbal cueing Transfers Transfers: Sit to Stand;Stand to Sit;Stand Pivot Transfers Sit to Stand: Moderate Assistance - Patient 50-74% Stand to Sit: Minimal Assistance - Patient > 75% Stand Pivot Transfers: Moderate Assistance - Patient 50 - 74% Stand Pivot Transfer Details: Verbal cues for technique;Verbal cues for precautions/safety Stand Pivot Transfer Details (indicate cue type and reason): verbal cues for transfer technique, hand placement, and safety Transfer (Assistive device): None Locomotion  Gait Ambulation: Yes Gait Assistance: 2 Helpers Gait Distance  (Feet): 5 Feet Assistive device: Other (Comment) (L handrail) Gait Assistance Details: Verbal cues for sequencing;Verbal cues for technique;Verbal cues for precautions/safety;Verbal cues for gait pattern;Manual facilitation for weight shifting;Manual facilitation for placement Gait Assistance Details: verbal cues for upright posture and knee extension, verbal cues to increased RLE step length, and manual faciliation to advance and control RLE. Gait Gait: Yes Gait Pattern: Impaired Gait Pattern: Step-to pattern;Decreased stance time - right;Decreased trunk rotation;Decreased stride length;Decreased weight shift to right;Right flexed knee in stance;Left flexed knee in stance;Decreased step length - right;Decreased step length - left;Trunk flexed;Poor foot clearance - left;Poor foot clearance - right;Narrow base of support Gait velocity: decreased Wheelchair Mobility Wheelchair Mobility: Yes Wheelchair Assistance: Chartered loss adjuster: Both lower extermities Wheelchair Parts Management: Supervision/cueing Distance: 9f  Discharge Criteria: Patient will be discharged from PT if patient refuses treatment 3 consecutive times without medical reason, if treatment goals not met, if there is a change in medical status, if patient makes no progress towards goals or if patient is discharged from hospital.  The above assessment, treatment plan, treatment alternatives and goals were discussed and mutually agreed upon: by patient  AAlfonse AlpersPT, DPT  12/27/2020, 12:14 PM

## 2020-12-27 NOTE — Progress Notes (Signed)
PROGRESS NOTE   Subjective/Complaints: Patient's chart reviewed- No issues reported overnight Vitals signs stable Cr 1.36, trending upward. Placed nursing order to encourage 608 glasses of water per day, repeat Monday He complains of chronic low back pain  ROS: +chronic low back pain   Objective:   No results found. Recent Labs    12/27/20 0442  WBC 7.1  HGB 14.2  HCT 40.4  PLT 92*   Recent Labs    12/27/20 0442  NA 136  K 3.6  CL 103  CO2 25  GLUCOSE 111*  BUN 25*  CREATININE 1.36*  CALCIUM 9.8    Intake/Output Summary (Last 24 hours) at 12/27/2020 1340 Last data filed at 12/27/2020 0802 Gross per 24 hour  Intake 615 ml  Output 475 ml  Net 140 ml        Physical Exam: Vital Signs Blood pressure 105/78, pulse 68, temperature 97.6 F (36.4 C), temperature source Oral, resp. rate 16, height 5\' 11"  (1.803 m), weight 83 kg, SpO2 94 %. Gen: no distress, normal appearing HEENT: oral mucosa pink and moist, NCAT Cardio: Reg rate Chest: normal effort, normal rate of breathing Abdominal:     General: Abdomen is flat. Bowel sounds are normal. There is no distension.  Musculoskeletal:     Cervical back: Normal range of motion and neck supple.     Comments: Left elbow boggy with fluid filled cyst? Nontender and no erythema noted--denies injury.    Skin:    General: Skin is warm and dry.  Neurological:     Mental Status: He is alert.     Comments: Alert HOH Motor: LUE/LLE: 5/5 proximal distal RUE: 4+-5/5 proximal and distal with minimal ataxia RLE: 4+/5 proximal distal  Psychiatric:        Mood and Affect: Mood normal.        Behavior: Behavior normal.    Assessment/Plan: 1. Functional deficits which require 3+ hours per day of interdisciplinary therapy in a comprehensive inpatient rehab setting.  Physiatrist is providing close team supervision and 24 hour management of active medical problems listed  below.  Physiatrist and rehab team continue to assess barriers to discharge/monitor patient progress toward functional and medical goals  Care Tool:  Bathing    Body parts bathed by patient: Right arm,Left arm,Chest,Abdomen,Buttocks,Right upper leg,Left upper leg,Right lower leg,Left lower leg,Front perineal area,Face         Bathing assist Assist Level: Contact Guard/Touching assist     Upper Body Dressing/Undressing Upper body dressing   What is the patient wearing?: Pull over shirt    Upper body assist Assist Level: Supervision/Verbal cueing    Lower Body Dressing/Undressing Lower body dressing      What is the patient wearing?: Underwear/pull up,Pants     Lower body assist Assist for lower body dressing: Minimal Assistance - Patient > 75%     Toileting Toileting    Toileting assist Assist for toileting: Minimal Assistance - Patient > 75%     Transfers Chair/bed transfer  Transfers assist     Chair/bed transfer assist level: Moderate Assistance - Patient 50 - 74%     Locomotion Ambulation   Ambulation assist  Assist level: 2 helpers Assistive device: Other (comment) (L rail) Max distance: 38ft   Walk 10 feet activity   Assist  Walk 10 feet activity did not occur: Safety/medical concerns (fatigue, decreased balance, R hemi, decreased motor planning/coordination)        Walk 50 feet activity   Assist Walk 50 feet with 2 turns activity did not occur: Safety/medical concerns (fatigue, decreased balance, R hemi, decreased motor planning/coordination)         Walk 150 feet activity   Assist Walk 150 feet activity did not occur: Safety/medical concerns (fatigue, decreased balance, R hemi, decreased motor planning/coordination)         Walk 10 feet on uneven surface  activity   Assist Walk 10 feet on uneven surfaces activity did not occur: Safety/medical concerns         Wheelchair     Assist Will patient use  wheelchair at discharge?: Yes Type of Wheelchair: Manual    Wheelchair assist level: Supervision/Verbal cueing Max wheelchair distance: 49ft    Wheelchair 50 feet with 2 turns activity    Assist        Assist Level: Supervision/Verbal cueing   Wheelchair 150 feet activity     Assist      Assist Level: Maximal Assistance - Patient 25 - 49%   Blood pressure 105/78, pulse 68, temperature 97.6 F (36.4 C), temperature source Oral, resp. rate 16, height 5\' 11"  (1.803 m), weight 83 kg, SpO2 94 %.  Medical Problem List and Plan: 1. 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               -patient may shower             -ELOS/Goals: 14 to 17 days/min a             Continue CIR 2.  Antithrombotics: -DVT/anticoagulation:  SCDs/TEDs--d/ced Lovenox due to low platelets.              -antiplatelet therapy: ASA daily.  3. Pain Management: Tylenol prn. Add lidocaine patch for low back pain.  4. Mood: LCSW to follow for evaluation and support.              -antipsychotic agents: N/A  5. Neuropsych: This patient is capable of making decisions on his own behalf. 6. Skin/Wound Care: Routine pressure relief measures.  7. Fluids/Electrolytes/Nutrition: Monitor I/Os.             CMP ordered for tomorrow 8.  HTN: Monitor blood pressures TID--continue tenormin daily.              Monitor with increased mobility 9.  Thrombocytopenia: Monitor for signs of bleeding.  Platelets have dropped to 88.             CBC ordered for tomorrow 10.  Hyperglycemia: Hemoglobin A1c 5.5 and likely stress related. 11.  COPD: Stable on Anoro 12.  Dyslipidemia:.             --on Lovaza and Zocor 13.  CKD?: Serum creatinine 1.5 at admission. Continue to monitor.              CMP ordered for tomorrow 14.  Left elbow cystic lesion: Will aspirate on Saturday 15.  Anxiety/depresion: Continues on home regimen of Prozac 40 mg, Xanax 0.5 mg qid and Triazolam 0.25 mg/hs for insomnia.   16. Constipation?: Will Colace with supper.  17. Tobacco abuse: Will add nicotine patch 18. AKI: placed nursing order to encourage 6-8 glasses of water per day, repeat Cr on Monday    LOS: 1 days A FACE TO FACE EVALUATION WAS PERFORMED  Circe Chilton P Arti Trang 12/27/2020, 1:40 PM

## 2020-12-27 NOTE — Progress Notes (Signed)
Inpatient Rehabilitation Care Coordinator Assessment and Plan Patient Details  Name: Juan Brewer MRN: [id number] Date of Birth: 1946/07/13  Today's Date: 12/27/2020  Hospital Problems: Active Problems:   CVA (cerebral vascular accident) [facility])  Past Medical History:  Past Medical History:  Diagnosis Date  . Hypertension    Past Surgical History:  Past Surgical History:  Procedure Laterality Date  . CHOLECYSTECTOMY     Social History:  reports that he has been smoking cigarettes. He has been smoking about 1.00 pack per day. He has never used smokeless tobacco. He reports that he does not use drugs. No history on file for alcohol use.  Family / Support Systems Marital Status: Married Patient Roles: Spouse,Parent,Other (Comment) (real estate agent) Spouse/Significant Other: [phone] Children: Son and daughter in-law Other Supports: Friends Anticipated Caregiver: Terri and daughter in-law Ability/Limitations of Caregiver: Wife works but Pharmacist, hospital in-law is at [employer] Availability: 24/7 (short time due to son and daughter in-law staying with until home finished) Family Dynamics: Close with son and family. Has friends and co-workers who are supportive and will check on his at home  Social History Preferred language: English Religion: Methodist Cultural Background: No issues Education: Automotive engineer educated Read: Yes Write: Yes Employment Status: Employed Name of Employer: Research officer, political party Agent-worked full time Return to Work Plans: Unsure would like to but depends upon his Lexicographer Issues: No issues Guardian/Conservator: None-according to MD pt is capable of making his own decisions while here   Abuse/Neglect Abuse/Neglect Assessment Can Be Completed: Yes Physical Abuse: Denies Verbal Abuse: Denies Sexual Abuse: Denies Exploitation of patient/patient's resources: Denies Self-Neglect: Denies  Emotional  Status Pt's affect, behavior and adjustment status: Pt is motivated to recover from this stroke, he has always been independent and taken care of himself. He is not one to sit still and wants to do lots of therapy to improve and get back to work Recent Psychosocial Issues: other health issues were being managed PTA Psychiatric History: History of depression/anxiety takes medications for this and finds them helpful Substance Abuse History: No issues-remote tobacco history  Patient / Family Perceptions, Expectations & Goals Pt/Family understanding of illness & functional limitations: Pt and wife can explain his stroke and deficits, both talk with the MD and feel they have good understanding of his treatment plan going forward. Pt plans to be mod/i by the time he leaves here. Premorbid pt/family roles/activities: Husband, father, grandfather, real Insurance account manager, Psychologist, occupational, etc Anticipated changes in roles/activities/participation: resume Pt/family expectations/goals: Pt states: " I need to get out of here and back to my life, I coach little league."  Wife states: " I hope he does well here we will come up with a plan for discharge."  [employer]: None Premorbid Home Care/DME Agencies: None Transportation available at discharge: Wife and pt drove PTA will rely upon wife now until able to drive again Resource referrals recommended: Neuropsychology  Discharge Planning Living Arrangements: Spouse/significant other,Children,Other relatives Support Systems: Spouse/significant other,Children,Other relatives,Friends/neighbors,Church/faith community Type of Residence: Private residence Insurance Resources: [employer] Financial Resources: [employer] Support Financial Screen Referred: No Living Expenses: Own Money Management: Patient,Spouse Does the patient have any problems obtaining your medications?: No Home Management: Both pt and wife Patient/Family Preliminary Plans:  Return home with wife and son and daughter in-law who are currently staying there while their home is being built. Wife does work but daughter in-law does not and can be there with him while wife is working. Aware being evaluated today and  goals being set for stay here. 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 His wife is involved but also works and son and daughter in-law will be assisting at discharge since living in their home right now. Would benefit from seeing neuro-psych while here and will work on discharge needs  Lucy Chris 12/27/2020, 11:50 AM

## 2020-12-28 DIAGNOSIS — R7989 Other specified abnormal findings of blood chemistry: Secondary | ICD-10-CM

## 2020-12-28 DIAGNOSIS — I6330 Cerebral infarction due to thrombosis of unspecified cerebral artery: Secondary | ICD-10-CM | POA: Diagnosis not present

## 2020-12-28 DIAGNOSIS — I10 Essential (primary) hypertension: Secondary | ICD-10-CM

## 2020-12-28 DIAGNOSIS — M7022 Olecranon bursitis, left elbow: Secondary | ICD-10-CM

## 2020-12-28 NOTE — Progress Notes (Signed)
PROGRESS NOTE   Subjective/Complaints: Pt in good spirits. Complains of chronic low back pain. Had a good day with therapy yesterday.  ROS: Patient denies fever, rash, sore throat, blurred vision, nausea, vomiting, diarrhea, cough, shortness of breath or chest pain,  headache, or mood change.   Objective:   No results found. Recent Labs    12/27/20 0442  WBC 7.1  HGB 14.2  HCT 40.4  PLT 92*   Recent Labs    12/27/20 0442  NA 136  K 3.6  CL 103  CO2 25  GLUCOSE 111*  BUN 25*  CREATININE 1.36*  CALCIUM 9.8    Intake/Output Summary (Last 24 hours) at 12/28/2020 1451 Last data filed at 12/28/2020 1317 Gross per 24 hour  Intake 592 ml  Output 300 ml  Net 292 ml        Physical Exam: Vital Signs Blood pressure 103/68, pulse 60, temperature 98.1 F (36.7 C), resp. rate 18, height 5\' 11"  (1.803 m), weight 83 kg, SpO2 91 %. Constitutional: No distress . Vital signs reviewed. HEENT: EOMI, oral membranes moist Neck: supple Cardiovascular: RRR without murmur. No JVD    Respiratory/Chest: CTA Bilaterally without wheezes or rales. Normal effort    GI/Abdomen: BS +, non-tender, non-distended Ext: no clubbing, cyanosis, or edema Psych: pleasant and cooperative Musculoskeletal:     Cervical back: Normal range of motion and neck supple.     Comments: Left elbow boggy olecranon bursa swollen Skin:    General: Skin is warm and dry.  Neurological:     Mental Status: He is alert.     Comments: Alert Sl HOH Motor: LUE/LLE: 5/5 proximal distal RUE: 4 to 4+/5 proximal and distal with minimal ataxia RLE: 4+/5 proximal distal      Assessment/Plan: 1. Functional deficits which require 3+ hours per day of interdisciplinary therapy in a comprehensive inpatient rehab setting.  Physiatrist is providing close team supervision and 24 hour management of active medical problems listed below.  Physiatrist and rehab team  continue to assess barriers to discharge/monitor patient progress toward functional and medical goals  Care Tool:  Bathing    Body parts bathed by patient: Right arm,Left arm,Chest,Abdomen,Buttocks,Right upper leg,Left upper leg,Right lower leg,Left lower leg,Front perineal area,Face         Bathing assist Assist Level: Contact Guard/Touching assist     Upper Body Dressing/Undressing Upper body dressing   What is the patient wearing?: Pull over shirt    Upper body assist Assist Level: Supervision/Verbal cueing    Lower Body Dressing/Undressing Lower body dressing      What is the patient wearing?: Underwear/pull up,Pants     Lower body assist Assist for lower body dressing: Minimal Assistance - Patient > 75%     Toileting Toileting    Toileting assist Assist for toileting: Minimal Assistance - Patient > 75%     Transfers Chair/bed transfer  Transfers assist     Chair/bed transfer assist level: Minimal Assistance - Patient > 75%     Locomotion Ambulation   Ambulation assist      Assist level: 2 helpers Assistive device: Other (comment) (L rail) Max distance: 6ft   Walk 10 feet  activity   Assist  Walk 10 feet activity did not occur: Safety/medical concerns (fatigue, decreased balance, R hemi, decreased motor planning/coordination)        Walk 50 feet activity   Assist Walk 50 feet with 2 turns activity did not occur: Safety/medical concerns (fatigue, decreased balance, R hemi, decreased motor planning/coordination)         Walk 150 feet activity   Assist Walk 150 feet activity did not occur: Safety/medical concerns (fatigue, decreased balance, R hemi, decreased motor planning/coordination)         Walk 10 feet on uneven surface  activity   Assist Walk 10 feet on uneven surfaces activity did not occur: Safety/medical concerns         Wheelchair     Assist Will patient use wheelchair at discharge?: Yes Type of Wheelchair:  Manual    Wheelchair assist level: Supervision/Verbal cueing Max wheelchair distance: 69ft    Wheelchair 50 feet with 2 turns activity    Assist        Assist Level: Supervision/Verbal cueing   Wheelchair 150 feet activity     Assist      Assist Level: Maximal Assistance - Patient 25 - 49%   Blood pressure 103/68, pulse 60, temperature 98.1 F (36.7 C), resp. rate 18, height 5\' 11"  (1.803 m), weight 83 kg, SpO2 91 %.  Medical Problem List and Plan: 1. 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               -patient may shower             -ELOS/Goals: 14 to 17 days/min a           -Continue CIR therapies including PT, OT  2.  Antithrombotics: -DVT/anticoagulation:  SCDs/TEDs--d/ced Lovenox due to low platelets.              -antiplatelet therapy: ASA daily.  3. Pain Management: Tylenol prn. Add lidocaine patch for low back pain.  4. Mood: LCSW to follow for evaluation and support.              -antipsychotic agents: N/A  5. Neuropsych: This patient is capable of making decisions on his own behalf. 6. Skin/Wound Care: Routine pressure relief measures.  7. Fluids/Electrolytes/Nutrition: Monitor I/Os.            encourage PO 8.  HTN: Monitor blood pressures TID--continue tenormin daily.              Monitor with increased mobility 9.  Thrombocytopenia: Monitor for signs of bleeding.  Platelets have dropped to 88.             CBC ordered for tomorrow 10.  Hyperglycemia: Hemoglobin A1c 5.5 and likely stress related. 11.  COPD: Stable on Anoro 12.  Dyslipidemia:.             --on Lovaza and Zocor 13.  AKI on CKD?: Serum creatinine 1.5 at admission. Continue to monitor.               -encouraging fluids.   -repeat BMET Monday 14.  Left elbow cystic lesion: likely olecranon bursitis.  ?aspirate on Saturday 15. Anxiety/depresion: Continues  on home regimen of Prozac 40 mg, Xanax 0.5 mg qid and Triazolam 0.25 mg/hs for insomnia.   16. Constipation?:  Colace with supper.  17. Tobacco abuse: added nicotine patch      LOS:  2 days A FACE TO FACE EVALUATION WAS PERFORMED  Ranelle Oyster 12/28/2020, 2:51 PM

## 2020-12-28 NOTE — Progress Notes (Signed)
Physical Therapy Session Note  Patient Details  Name: Juan Brewer MRN: [id number] Date of Birth: May 10, 1946  Today's Date: 12/28/2020 PT Individual Time: 1100-1130 PT Individual Time Calculation (min): 30 min   Short Term Goals: Week 1:  PT Short Term Goal 1 (Week 1): pt will transfer sit<>stand with LRAD and CGA PT Short Term Goal 2 (Week 1): pt will transfer bed<>chair with LRAD and CGA PT Short Term Goal 3 (Week 1): pt will ambulate 5ft with LRAD and max of 1  Skilled Therapeutic Interventions/Progress Updates:    pt received in recliner agreeable to therapy. Pt directed in x5 Sit to stand to Rolling walker mod A first rep and improved to min A with VC, min A for standing balance with VC for hip and trunk extension and increased width of stance. Pt then directed in gait training 20' max A for stability poor stability in turns especially and noted inconsistent stepping patterns throughout, trunk flexion worsened throughout and poor safety awareness. Pt directed in standing marching at Rolling walker min A for stability and brief instances of CGA VC for trunk extension and hip extension 3x10 rest breaks in sitting between sets 2/2 fatigue. Pt requested to return to bed at end of session and directed in 5' to bedside with Rolling walker at mod A to complete and CGA for sit>supine. Pt left in bed, All needs in reach and in good condition. Call light in hand.  And alarm set.   Therapy Documentation Precautions:  Precautions Precautions: Fall Precaution Comments: R hemi, mild R inattention Restrictions Weight Bearing Restrictions: No General:   Vital Signs:   Pain: Pain Assessment Pain Scale: 0-10 Pain Score: 2  Pain Type: Chronic pain Pain Location: Back Pain Orientation: Lower Pain Descriptors / Indicators: Aching Pain Frequency: Constant Pain Onset: On-going Pain Intervention(s): Medication (See eMAR) Mobility:   Locomotion :    Trunk/Postural Assessment :     Balance:   Exercises:   Other Treatments:      Therapy/Group: Individual Therapy  Barbaraann Faster 12/28/2020, 12:16 PM

## 2020-12-28 NOTE — Progress Notes (Signed)
Physical Therapy Session Note  Patient Details  Name: Juan Brewer MRN: [id number] Date of Birth: [birth year]  Today's Date: 12/28/2020 PT Individual Time:Session1: 2956-2130; [place]: 8657-8469 PT Individual Time Calculation (min): 30 min & 44 min  Short Term Goals: Week 1:  PT Short Term Goal 1 (Week 1): pt will transfer sit<>stand with LRAD and CGA PT Short Term Goal 2 (Week 1): pt will transfer bed<>chair with LRAD and CGA PT Short Term Goal 3 (Week 1): pt will ambulate 66ft with LRAD and max of 1  Skilled Therapeutic Interventions/Progress Updates:    Session1: Patient in supine and reports no issues. Performed supine to sit with min A.  Patient seated EOB assisted to don TEDs and shoes total A.  Patient sit to stand with min A and noticed soiled with urine.  Assisted with Stand step to w/c min A.  Patient seated in w/c at sink to complete teeth brushing with S, washing face and combing hair with S.  Patient sit to stand at sink with min A.  Doffed pants min A and doffed brief max A.  Patient standing for perineal hygiene with min A.  Seated to doff pants completely with mod A.  Patient donned shorts with min A threading them while sitting then sit to stand to pull up with assist for balance.  Patient doffed shirt with S and completed upper body bathing with S and set up and donned clean shirt with S.  Patient stand step to recliner with RW and CGA.  Left in recliner with call bell in reach, chair alarm active and NT in the room.   Session2: Patient in supine with family in the room.  Reports no issues and supine to sit with S.  Patient seated to don shoes with mod A at EOB.  Sit to stand to RW with CGA.  Patient ambulated x 87' with RW and min to mod A with LOB as turning to R cues for R foot clearance and keeping w/c close.  Patient assisted to therapy gym and ambulated another 41' with RW and min A with R knee buckling in stance and occasional dragging.  Standing dynamic balance and forced  use on R with L on 4" step reaching to retrieve clothes pins from basketball net at highest setting without AD.  Standing reaching to R to retrieve horse shoes over basketball hoop and then toss to staub with CGA to min A for safety without AD cues for keeping R knee extended. Patient sit to supine on mat with S.  Performed 1/2 bridging with R foot on 8" step off edge of mat x 10.  Lifting and lowering L foot mat to stool under mat x 10.  Sidelying R hip clamshell abduction with orange t-band for resistance.  R UE horizontal abduction/adduction (close/open book) x 10 cues for proximal control.  Patient ambulated another 53' with RW and CGA to min A.  Assisted in w/c to room.  Transferred to EOB with min A.  RN in to deliver medication.  Left on EOB with RN in the room.  Therapy Documentation Precautions:  Precautions Precautions: Fall Precaution Comments: R hemi, mild R inattention Restrictions Weight Bearing Restrictions: No Pain: Pain Assessment Pain Scale: 0-10 Pain Score: 0-No pain     Therapy/Group: Individual Therapy  [name]  Sheran Lawless, PT 12/28/2020, 12:18 PM

## 2020-12-28 NOTE — Progress Notes (Signed)
Occupational Therapy Session Note  Patient Details  Name: Juan Brewer MRN: [id number] Date of Birth: 12/07/1945  Today's Date: 12/28/2020 OT Individual Time: 0930-1045 OT Individual Time Calculation (min): 75 min    Short Term Goals: Week 1:  OT Short Term Goal 1 (Week 1): Pt will demonstrate improved RUE coordination to complete eating independently, opening containers himself. OT Short Term Goal 2 (Week 1): Pt will maintain sitting balance EOB with no lateral lean while donning clothing with no cues. OT Short Term Goal 3 (Week 1): Pt will don underwear and pants with CGA for balance only. OT Short Term Goal 4 (Week 1): Pt will be able to don and tie shoes independently.  Skilled Therapeutic Interventions/Progress Updates:    Pt received in recliner ready for therapy. Pt stated that I could have the books a friend brought him because he could not read. Pt joking around quite a bit.  Pt completed squat pivot transfer to wc with only min A but MOD cues as he does not complete a full pivot to his R due to R inattention. Pt worked on self propelling w/c with mod cues and min A due to R side incoordination. -Pt initially worked on BITS to assess visual scanning in all fields with an alphabet sequencing task. Pt kept saying he did not know his alphabet and needed constant cues for each letter. He did better with number sequencing.  -arm bike at resistance 3.5 for RUE coordination.  -transferred to mat with min A to work on sitting balance with lateral wt shift to L for L trunk elongation as pt's balance biased toward the R today.  Max cues at times with following directions with exercises.  He had difficulty attending to his body position and the tasks at the same time -5# dowel bar BUE exercises to increase R arm strength -trunk rotations with therapy ball - in hand FMC manipulations with picking up poker discs in one hand and sorting cards (no difficulty with these tasks) -sit to stand with  tactile A for R foot placement, then holding stand with upright posture. Pt leaning to R in standing, mod cues to wt shift to L.  -transfer back to wc to R, pt landed on arm rest as he did not fully swivel his hips and he did not seem to think this was a problem. Max cues to adjust hips.   Pt taken back to his room and he did a bed sq pivot to L to recliner.  Resting in recliner with chair alarm on and all needs met.   Therapy Documentation Precautions:  Precautions Precautions: Fall Precaution Comments: R hemi, mild R inattention Restrictions Weight Bearing Restrictions: No  Pain: Pain Assessment Pain Score: 0-No pain ADL: ADL Eating: Set up Grooming: Setup Upper Body Bathing: Supervision/safety Where Assessed-Upper Body Bathing: Shower Lower Body Bathing: Contact guard Where Assessed-Lower Body Bathing: Shower Upper Body Dressing: Supervision/safety Lower Body Dressing: Minimal assistance Where Assessed-Lower Body Dressing: Wheelchair Toileting: Minimal assistance Where Assessed-Toileting: Toilet Toilet Transfer: Minimal assistance Toilet Transfer Method: Squat pivot Toilet Transfer Equipment: Grab bars,Raised toilet seat Walk-In Shower Transfer: Minimal assistance Walk-In Shower Transfer Method: Squat pivot Walk-In Shower Equipment: Transfer tub bench,Grab bars   Therapy/Group: Individual Therapy  SAGUIER,JULIA 12/28/2020, 11:16 AM  

## 2020-12-28 NOTE — Progress Notes (Signed)
Occupational Therapy Session Note  Patient Details  Name: Juan Brewer MRN: [id number] Date of Birth: 1945-11-14  Today's Date: 12/28/2020 OT Individual Time: 8832-5498 OT Individual Time Calculation (min): 30 min    Short Term Goals: Week 1:  OT Short Term Goal 1 (Week 1): Pt will demonstrate improved RUE coordination to complete eating independently, opening containers himself. OT Short Term Goal 2 (Week 1): Pt will maintain sitting balance EOB with no lateral lean while donning clothing with no cues. OT Short Term Goal 3 (Week 1): Pt will don underwear and pants with CGA for balance only. OT Short Term Goal 4 (Week 1): Pt will be able to don and tie shoes independently.  Skilled Therapeutic Interventions/Progress Updates:    Pt sidelying in bed, requesting to use the bathroom stating "I have to tee-tee".  Upon sitting up OT noted pt had incontinent episode of urine with soiled brief and shorts, however pt unaware of occurrence when OT asked if he felt wet.  Stand pivot EOB to w/c with mod assist to prevent premature descent and safe RW management.  Transported pt to bathroom and completed stand pivot using BUE on grab bar with min assist.  Min assist needed to doff soiled brief and shorts.  Mod assist needed for periwashing with soap and water and to donn clean brief.  CGA to donn pants at sit<>stand level using RW for balance.  Pt asked to brush teeth and wash hair using shower cap and completed seated sinkside with setup.  Stand pivot w/c to EOB without AD needing mod assist.  Sit to supine with CGA.  Call bell in reach, bed alarm on.   Therapy Documentation Precautions:  Precautions Precautions: Fall Precaution Comments: R hemi, mild R inattention Restrictions Weight Bearing Restrictions: No   Therapy/Group: Individual Therapy  Amie Critchley 12/28/2020, 2:56 PM

## 2020-12-29 NOTE — IPOC Note (Signed)
Overall Plan of Care [facility]) Patient Details Name: Juan Brewer MRN: [id number] DOB: 06/09/1946  Admitting Diagnosis: CVA (cerebral vascular accident) [facility])  Hospital Problems: Principal Problem:   CVA (cerebral vascular accident) [facility])     Functional Problem List: Nursing Behavior,Bladder,Bowel,Endurance,Medication Management,Pain,Safety  PT Balance,Endurance,Motor,Perception,Safety  OT Balance  SLP    TR         Basic ADL's: OT Bathing,Dressing,Toileting,Grooming,Eating     Advanced  ADL's: OT       Transfers: PT Bed Mobility,Bed to Chair,Car,Furniture  OT Toilet,Tub/Shower     Locomotion: PT Ambulation,Wheelchair Mobility,Stairs     Additional Impairments: OT Fuctional Use of Upper Extremity  SLP        TR      Anticipated Outcomes Item Anticipated Outcome  Self Feeding independent  Swallowing      Basic self-care  supervision  Toileting  supervision   Bathroom Transfers supervision  Bowel/Bladder  Min assist  Transfers  supervision  Locomotion  min A with LRAD  Communication     Cognition     Pain  <3  Safety/Judgment  Min assist and no falls   Therapy Plan: PT Intensity: Minimum of 1-2 x/day ,45 to 90 minutes PT Frequency: 5 out of 7 days PT Duration Estimated Length of Stay: 14-18 days OT Intensity: Minimum of 1-2 x/day, 45 to 90 minutes OT Frequency: 5 out of 7 days OT Duration/Estimated Length of Stay: 14-18 days     Due to the current state of emergency, patients may not be receiving their 3-hours of Medicare-mandated therapy.   Team Interventions: Nursing Interventions Patient/Family Education,Bladder Management,Bowel Management,Disease Management/Prevention,Pain Management,Medication Management,Discharge Planning,Psychosocial Support  PT interventions Ambulation/gait training,Discharge planning,Functional mobility training,Psychosocial support,Therapeutic Activities,Balance/vestibular training,Disease  management/prevention,Neuromuscular re-education,Skin care/wound Environmental consultant propulsion/positioning,Cognitive remediation/compensation,DME/adaptive equipment instruction,Pain management,Splinting/orthotics,UE/LE Strength taining/ROM,Community reintegration,Functional electrical stimulation,Patient/family education,Stair training,UE/LE Coordination activities  OT Interventions Balance/vestibular training,Cognitive remediation/compensation,Discharge planning,Functional mobility training,DME/adaptive equipment instruction,Neuromuscular re-education,Patient/family education,Psychosocial support,Therapeutic Activities,Self Care/advanced ADL retraining,Therapeutic Exercise,UE/LE Strength taining/ROM,UE/LE Coordination activities  SLP Interventions    TR Interventions    SW/CM Interventions Discharge Planning,Psychosocial Support,Patient/Family Education   Barriers to Discharge MD  Medical stability  Nursing Decreased caregiver support,Home environment access/layout,Incontinence,Lack of/limited family support,Weight,Medication compliance,Behavior    PT Inaccessible home environment,Home environment access/layout 1 STE with 0 rails and 1 flight of steps to get upstairs; but can stay on main level.  OT      SLP      SW       Team Discharge Planning: Destination: PT-Home ,OT- Home , SLP-  Projected Follow-up: PT-Home health PT, OT-  Home health OT, SLP-  Projected Equipment Needs: PT-To be determined, OT- Tub/shower bench,3 in 1 bedside comode, SLP-  Equipment Details: PT-has none, OT-  Patient/family involved in discharge planning: PT- Patient,  OT-Patient, SLP-   MD ELOS: 14-17 days minA Medical Rehab Prognosis:  Excellent Assessment: Juan Brewer is a 75 year old man who is admitted to CIR with right-sided hemiparesis with decreased balance as well as ataxic RLE secondary to left lentiform nucleus/corona radiata and moderate chronic microvascular ischemic disease.  Active medical issues include AKI on CKD, left elbow cystic lesion, and HTN. Medications, labs, and vitals are being monitored with adjustments as needed.     See Team Conference Notes for weekly updates to the plan of care

## 2020-12-29 NOTE — Progress Notes (Signed)
PROGRESS NOTE   Subjective/Complaints: Feeling well this morning.  Denies pain Last Bm 3-4 days ago. Discussed various options and patient prefers to try prune juice with meals  ROS: Patient denies fever, rash, sore throat, blurred vision, nausea, vomiting, diarrhea, cough, shortness of breath or chest pain,  headache, or mood change, +constipation  Objective:   No results found. Recent Labs    12/27/20 0442  WBC 7.1  HGB 14.2  HCT 40.4  PLT 92*   Recent Labs    12/27/20 0442  NA 136  K 3.6  CL 103  CO2 25  GLUCOSE 111*  BUN 25*  CREATININE 1.36*  CALCIUM 9.8    Intake/Output Summary (Last 24 hours) at 12/29/2020 1041 Last data filed at 12/29/2020 0700 Gross per 24 hour  Intake 476 ml  Output 675 ml  Net -199 ml        Physical Exam: Vital Signs Blood pressure 122/84, pulse (!) 57, temperature 98 F (36.7 C), resp. rate 20, height 5\' 11"  (1.803 m), weight 83 kg, SpO2 91 %. Gen: no distress, normal appearing HEENT: oral mucosa pink and moist, NCAT Cardio: Bradycardia Chest: normal effort, normal rate of breathing Abd: soft, non-distended Ext: no edema Psych: pleasant, normal affect Musculoskeletal:     Cervical back: Normal range of motion and neck supple.     Comments: Left elbow boggy olecranon bursa swollen Skin:    General: Skin is warm and dry.  Neurological:     Mental Status: He is alert.     Comments: Alert Sl HOH Motor: LUE/LLE: 5/5 proximal distal RUE: 4 to 4+/5 proximal and distal with minimal ataxia RLE: 4+/5 proximal distal      Assessment/Plan: 1. Functional deficits which require 3+ hours per day of interdisciplinary therapy in a comprehensive inpatient rehab setting.  Physiatrist is providing close team supervision and 24 hour management of active medical problems listed below.  Physiatrist and rehab team continue to assess barriers to discharge/monitor patient progress  toward functional and medical goals  Care Tool:  Bathing    Body parts bathed by patient: Right arm,Left arm,Chest,Abdomen,Buttocks,Right upper leg,Left upper leg,Right lower leg,Left lower leg,Front perineal area,Face         Bathing assist Assist Level: Contact Guard/Touching assist     Upper Body Dressing/Undressing Upper body dressing   What is the patient wearing?: Pull over shirt    Upper body assist Assist Level: Supervision/Verbal cueing    Lower Body Dressing/Undressing Lower body dressing      What is the patient wearing?: Underwear/pull up,Pants     Lower body assist Assist for lower body dressing: Minimal Assistance - Patient > 75%     Toileting Toileting    Toileting assist Assist for toileting: Minimal Assistance - Patient > 75%     Transfers Chair/bed transfer  Transfers assist     Chair/bed transfer assist level: Minimal Assistance - Patient > 75%     Locomotion Ambulation   Ambulation assist      Assist level: Minimal Assistance - Patient > 75% Assistive device: Walker-rolling Max distance: 80'   Walk 10 feet activity   Assist  Walk 10 feet activity did  not occur: Safety/medical concerns (fatigue, decreased balance, R hemi, decreased motor planning/coordination)  Assist level: Minimal Assistance - Patient > 75% Assistive device: Walker-rolling   Walk 50 feet activity   Assist Walk 50 feet with 2 turns activity did not occur: Safety/medical concerns (fatigue, decreased balance, R hemi, decreased motor planning/coordination)  Assist level: Minimal Assistance - Patient > 75% Assistive device: Walker-rolling    Walk 150 feet activity   Assist Walk 150 feet activity did not occur: Safety/medical concerns (fatigue, decreased balance, R hemi, decreased motor planning/coordination)         Walk 10 feet on uneven surface  activity   Assist Walk 10 feet on uneven surfaces activity did not occur: Safety/medical  concerns         Wheelchair     Assist Will patient use wheelchair at discharge?: Yes Type of Wheelchair: Manual    Wheelchair assist level: Supervision/Verbal cueing Max wheelchair distance: 8ft    Wheelchair 50 feet with 2 turns activity    Assist        Assist Level: Supervision/Verbal cueing   Wheelchair 150 feet activity     Assist      Assist Level: Maximal Assistance - Patient 25 - 49%   Blood pressure 122/84, pulse (!) 57, temperature 98 F (36.7 C), resp. rate 20, height 5\' 11"  (1.803 m), weight 83 kg, SpO2 91 %.  Medical Problem List and Plan: 1. 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               -patient may shower             -ELOS/Goals: 14 to 17 days/min a           -Continue CIR therapies including PT, OT  2.  Antithrombotics: -DVT/anticoagulation:  SCDs/TEDs--d/ced Lovenox due to low platelets.              -antiplatelet therapy: ASA daily.  3. Pain Management: Tylenol prn. Continue lidocaine patch for low back pain.  4. Mood: LCSW to follow for evaluation and support.              -antipsychotic agents: N/A  5. Neuropsych: This patient is capable of making decisions on his own behalf. 6. Skin/Wound Care: Routine pressure relief measures.  7. Fluids/Electrolytes/Nutrition: Monitor I/Os.            encourage PO 8.  HTN: Monitor blood pressures TID--continue tenormin daily.              Monitor with increased mobility 9.  Thrombocytopenia: Monitor for signs of bleeding.  Platelets have dropped to 88. Repeat PC Monday. 10.  Hyperglycemia: Hemoglobin A1c 5.5 and likely stress related. 11.  COPD: Stable on Anoro 12.  Dyslipidemia:.             --on Lovaza and Zocor 13.  AKI on CKD?: Serum creatinine 1.5 at admission. Continue to monitor.               -encouraging fluids.   -repeat BMET Monday 14.  Left  elbow cystic lesion: likely olecranon bursitis.  Outpatient f/u with [facility] 15. Anxiety/depresion: Continues on home regimen of Prozac 40 mg, Xanax 0.5 mg qid and Triazolam 0.25 mg/hs for insomnia.   16. Constipation:  Colace with supper. Add prune juice with meals.  17. Tobacco abuse: added nicotine patch      LOS:  3 days A FACE TO FACE EVALUATION WAS PERFORMED  Juan Brewer 12/29/2020, 10:41 AM

## 2020-12-30 MED ORDER — MAGNESIUM CITRATE PO SOLN
1.0000 | Freq: Once | ORAL | Status: AC
  Administered 2020-12-30: 1 via ORAL
  Filled 2020-12-30: qty 296

## 2020-12-30 NOTE — Progress Notes (Signed)
Occupational Therapy Session Note  Patient Details  Name: Juan Brewer MRN: [id number] Date of Birth: 1946-02-23  Today's Date: 12/30/2020 OT Individual Time: 6761-9509 OT Individual Time Calculation (min): 38 min    Short Term Goals: Week 1:  OT Short Term Goal 1 (Week 1): Pt will demonstrate improved RUE coordination to complete eating independently, opening containers himself. OT Short Term Goal 2 (Week 1): Pt will maintain sitting balance EOB with no lateral lean while donning clothing with no cues. OT Short Term Goal 3 (Week 1): Pt will don underwear and pants with CGA for balance only. OT Short Term Goal 4 (Week 1): Pt will be able to don and tie shoes independently.  Skilled Therapeutic Interventions/Progress Updates:    OT interventin with focus on bed mobility, functional tranfsers, sit<>stand, standing balance, BUE tasks/function, and safety awareness to increase independence with BADLs. Supine>sit EOB with min A. Pt with difficulty problem solving to push up from sidelying. Squat pivot tranfsers to w/c with min A. Pt initially engaged in corn hole. Sit<>stand with min A and standing balance with min A fading to mod A with LOB to R during tasks. Pt aware that he was losing balance but unable to initiate correction. Pt grasping and releasing bean bags without difficulty. FMC and gross motor table tasks: 9 hole peg-Rt-40.75 secs, Lt 27.75 secs Box/block-Rt-34, Lt 44  Pt returned to room and tranfserred back to bed. Some impulsivity noted with pt's unawareness of body positoin in space. Pt remained in bed with all needs within reach and bed alarm activated.   Therapy Documentation Precautions:  Precautions Precautions: Fall Precaution Comments: R hemi, mild R inattention Restrictions Weight Bearing Restrictions: No  Pain: Pain Assessment Pain Scale: 0-10 Pain Score: 2  Pain Type: Chronic pain Pain Location: Back Pain Orientation: Lower Pain Descriptors / Indicators:  Aching Pain Frequency: Constant Pain Onset: On-going Patients Stated Pain Goal: 1 Pain Intervention(s): activity and repositioned  Therapy/Group: Individual Therapy  Rich Brave 12/30/2020, 2:39 PM

## 2020-12-30 NOTE — Progress Notes (Signed)
PROGRESS NOTE   Subjective/Complaints: Last BM 4 days ago- mag citrate ordered for today after therapy.  Participated well with PT today Has no other complaints Very pleasant  ROS: Patient denies fever, rash, sore throat, blurred vision, nausea, vomiting, diarrhea, cough, shortness of breath or chest pain,  headache, or mood change, +constipation  Objective:   No results found. No results for input(s): WBC, HGB, HCT, PLT in the last 72 hours. No results for input(s): NA, K, CL, CO2, GLUCOSE, BUN, CREATININE, CALCIUM in the last 72 hours.  Intake/Output Summary (Last 24 hours) at 12/30/2020 1041 Last data filed at 12/30/2020 0750 Gross per 24 hour  Intake 710 ml  Output 850 ml  Net -140 ml        Physical Exam: Vital Signs Blood pressure 107/73, pulse (!) 59, temperature 97.8 F (36.6 C), resp. rate 20, height 5\' 11"  (1.803 m), weight 83 kg, SpO2 90 %. Gen: no distress, normal appearing HEENT: oral mucosa pink and moist, NCAT Cardio: Bradycardia Chest: normal effort, normal rate of breathing Abd: soft, non-distended Ext: no edema Psych: pleasant, normal affect Musculoskeletal:     Cervical back: Normal range of motion and neck supple.     Comments: Left elbow boggy olecranon bursa swollen Skin:    General: Skin is warm and dry.  Neurological:     Mental Status: He is alert.     Comments: Alert Sl HOH Motor: LUE/LLE: 5/5 proximal distal RUE: 4 to 4+/5 proximal and distal with minimal ataxia RLE: 4+/5 proximal distal      Assessment/Plan: 1. Functional deficits which require 3+ hours per day of interdisciplinary therapy in a comprehensive inpatient rehab setting.  Physiatrist is providing close team supervision and 24 hour management of active medical problems listed below.  Physiatrist and rehab team continue to assess barriers to discharge/monitor patient progress toward functional and medical goals  Care  Tool:  Bathing    Body parts bathed by patient: Right arm,Left arm,Chest,Abdomen,Buttocks,Right upper leg,Left upper leg,Right lower leg,Left lower leg,Front perineal area,Face         Bathing assist Assist Level: Contact Guard/Touching assist     Upper Body Dressing/Undressing Upper body dressing   What is the patient wearing?: Pull over shirt    Upper body assist Assist Level: Supervision/Verbal cueing    Lower Body Dressing/Undressing Lower body dressing      What is the patient wearing?: Underwear/pull up,Pants     Lower body assist Assist for lower body dressing: Minimal Assistance - Patient > 75%     Toileting Toileting    Toileting assist Assist for toileting: Moderate Assistance - Patient 50 - 74%     Transfers Chair/bed transfer  Transfers assist     Chair/bed transfer assist level: Minimal Assistance - Patient > 75%     Locomotion Ambulation   Ambulation assist      Assist level: Minimal Assistance - Patient > 75% Assistive device: Walker-rolling Max distance: 35'   Walk 10 feet activity   Assist  Walk 10 feet activity did not occur: Safety/medical concerns (fatigue, decreased balance, R hemi, decreased motor planning/coordination)  Assist level: Minimal Assistance - Patient > 75% Assistive device:  Walker-rolling   Walk 50 feet activity   Assist Walk 50 feet with 2 turns activity did not occur: Safety/medical concerns (fatigue, decreased balance, R hemi, decreased motor planning/coordination)  Assist level: Minimal Assistance - Patient > 75% Assistive device: Walker-rolling    Walk 150 feet activity   Assist Walk 150 feet activity did not occur: Safety/medical concerns (fatigue, decreased balance, R hemi, decreased motor planning/coordination)         Walk 10 feet on uneven surface  activity   Assist Walk 10 feet on uneven surfaces activity did not occur: Safety/medical concerns         Wheelchair     Assist  Will patient use wheelchair at discharge?: Yes Type of Wheelchair: Manual    Wheelchair assist level: Supervision/Verbal cueing Max wheelchair distance: 35ft    Wheelchair 50 feet with 2 turns activity    Assist        Assist Level: Supervision/Verbal cueing   Wheelchair 150 feet activity     Assist      Assist Level: Maximal Assistance - Patient 25 - 49%   Blood pressure 107/73, pulse (!) 59, temperature 97.8 F (36.6 C), resp. rate 20, height 5\' 11"  (1.803 m), weight 83 kg, SpO2 90 %.  Medical Problem List and Plan: 1. 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               -patient may shower             -ELOS/Goals: 14 to 17 days/min a           -Continue CIR therapies including PT, OT  2.  Antithrombotics: -DVT/anticoagulation:  SCDs/TEDs--d/ced Lovenox due to low platelets.              -antiplatelet therapy: ASA daily.  3. Pain Management: Tylenol prn. Continue lidocaine patch for low back pain.  4. Mood: LCSW to follow for evaluation and support.              -antipsychotic agents: N/A  5. Neuropsych: This patient is capable of making decisions on his own behalf. 6. Skin/Wound Care: Routine pressure relief measures.  7. Fluids/Electrolytes/Nutrition: Monitor I/Os.            encourage PO 8.  HTN: Monitor blood pressures TID--continue tenormin daily.              Monitor with increased mobility 9.  Thrombocytopenia: Monitor for signs of bleeding.  Platelets have dropped to 88. Repeat PC Monday. 10.  Hyperglycemia: Hemoglobin A1c 5.5 and likely stress related. 11.  COPD: Stable on Anoro 12.  Dyslipidemia:.             --on Lovaza and Zocor 13.  AKI on CKD?: Serum creatinine 1.5 at admission. Continue to monitor.               -encouraging fluids.   -repeat BMET Monday 14.  Left elbow cystic lesion: likely olecranon bursitis.   Outpatient f/u with [facility] 15. Anxiety/depresion: Continues on home regimen of Prozac 40 mg, Xanax 0.5 mg qid and Triazolam 0.25 mg/hs for insomnia.   16. Constipation:  Colace with supper. Add prune juice with meals. Mag citrate after therapy on 6/5.  17. Tobacco abuse: continue nicotine patch 18. Bradycardia: continue to monitor TID      LOS: 4 days A FACE TO FACE EVALUATION WAS PERFORMED  8/5  P Jarone Ostergaard 12/30/2020, 10:41 AM

## 2020-12-30 NOTE — Progress Notes (Signed)
Physical Therapy Session Note  Patient Details  Name: Juan Brewer MRN: [id number] Date of Birth: [birth year]  Today's Date: 12/30/2020 PT Individual Time: 1300-1330 PT Individual Time Calculation (min): 30 min   Short Term Goals: Week 1:  PT Short Term Goal 1 (Week 1): pt will transfer sit<>stand with LRAD and CGA PT Short Term Goal 2 (Week 1): pt will transfer bed<>chair with LRAD and CGA PT Short Term Goal 3 (Week 1): pt will ambulate 42ft with LRAD and max of 1  Skilled Therapeutic Interventions/Progress Updates:    Pt presents in bed and agreeable to session. Min assist to come to EOB with facilitation at trunk needed to achieve upright posture and maintain balance initially. Mod assist for initial sit > stand with RW and posterior bias. Min assist for stand step with RW to w/c with cues for safe positioning of RW (tendency to keep too far out in front) and uncontrolled descent to the w/c. PT not allowing time to complete rotation of body or line up with w/c before starting to sit down (noted again during other transfer and back to bed at end of session). Total assist for transport for time management via w/c. Min/mod assist with RW for stand step to the w/c and cues for attention to positioning of BLE and cues for knee and hip extension. Nustep for NMR and general strengthening x 5 min with BUE and BLE and then x 5 min with BLE only with focus maintaining steps per min > 40. Resistance level 6. Returned to bed per pt request at end of session with min assist overall but poor safety and body awareness noted during mobility. All needs in reach and able to reposition in bed.   Therapy Documentation Precautions:  Precautions Precautions: Fall Precaution Comments: R hemi, mild R inattention Restrictions Weight Bearing Restrictions: No   Pain: No complaints.    Therapy/Group: Individual Therapy  Karolee Stamps Darrol Poke, PT, DPT, CBIS  12/30/2020, 2:03 PM

## 2020-12-30 NOTE — Progress Notes (Signed)
Physical Therapy Session Note  Patient Details  Name: Juan Brewer MRN: [id number] Date of Birth: June 30, 1946  Today's Date: 12/30/2020 PT Individual Time: 1093-2355 PT Individual Time Calculation (min): 60 min   Short Term Goals: Week 1:  PT Short Term Goal 1 (Week 1): pt will transfer sit<>stand with LRAD and CGA PT Short Term Goal 2 (Week 1): pt will transfer bed<>chair with LRAD and CGA PT Short Term Goal 3 (Week 1): pt will ambulate 2ft with LRAD and max of 1  Skilled Therapeutic Interventions/Progress Updates:   Pt presents in bed soiled in urine but unaware. Mod assist for sit > stand EOB with focus on hand placement and facilitation of anterior weightshift with RW and then gait into bathroom with mod assist and cues for hip and knee extension and upright posture. Mod assist for sit <> stands from toilet with use of grab bars and RW for support. Assisted with complete hygiene due to urine getting on all clothing. Pt urinated continently again on toilet as well. Mod assist for standing balance to pull up fresh clothing with UE support and cues for knee extension as pt starting to crouch and demonstrate increased knee and trunk flexion as fatigued. Mod to max assist for gait out of bathroom with crouched position.  Use of Lite Gait for NMR to focus on repetition during gait with with focus on facilitation of weightshifting, increasing step length bilaterally with visual target and verbal cues and facilitation at quads to promote knee extension. Noted to compensate heavily with UE support and through trunk.  0.6 mph x 196' in 4 min 9 sec 0.5-0.6 mph x 184' in  4 mi  NMR for carryover to gait with RW over ground requiring min to mod assist x 35' with focus on carryover of cues from [employer] training. As fatigues, noted increased shuffle in gait, flexed posture, decreased heel strike, and decreased step length as well as difficulty with placement of RW for safe positioning. End of session  short distance gait with min assist with RW back to bed and returned to supine with supervision.   Therapy Documentation Precautions:  Precautions Precautions: Fall Precaution Comments: R hemi, mild R inattention Restrictions Weight Bearing Restrictions: No Pain: Denies pain.   Therapy/Group: Individual Therapy  Karolee Stamps Darrol Poke, PT, DPT, CBIS  12/30/2020, 9:46 AM

## 2020-12-30 NOTE — Progress Notes (Signed)
Occupational Therapy Session Note  Patient Details  Name: Juan Brewer MRN: [id number] Date of Birth: 26-Aug-1945  Today's Date: 12/30/2020 OT Individual Time: 1015-1110 OT Individual Time Calculation (min): 55 min    Short Term Goals: Week 1:  OT Short Term Goal 1 (Week 1): Pt will demonstrate improved RUE coordination to complete eating independently, opening containers himself. OT Short Term Goal 2 (Week 1): Pt will maintain sitting balance EOB with no lateral lean while donning clothing with no cues. OT Short Term Goal 3 (Week 1): Pt will don underwear and pants with CGA for balance only. OT Short Term Goal 4 (Week 1): Pt will be able to don and tie shoes independently.  Skilled Therapeutic Interventions/Progress Updates:    OT intervention with focus on bed mobility, functional transfers, standing balance, bathing at shower level, and dressing with sit<>stand from w/c, activity tolerance, and safety awareness to increase independence with BADLs. Supine>sit EOB with supervision. Pt removed socks and shoes seated EOB. Squat pivot transfer to w/c with min A. Squat pivot transfer w/c<>TTB with min A using grab bars. BAthing with CGA using lateral leans for buttocks. Dressing with min A for sit<>stand/standing to pull pants over hips. Grooming with supervision seated in w/c at sink. Slight LOB to Rt when standing. Pt requires more then a reasonable amoun of time to complete tasks.Squat pivot transfer w/c>bed with min A and min verbal cues for safety. Pt with slight impulsivity. Pt remained in bed with all needs within reach and bed alarm activated.   Therapy Documentation Precautions:  Precautions Precautions: Fall Precaution Comments: R hemi, mild R inattention Restrictions Weight Bearing Restrictions: No  Pain: Pain Assessment Pain Scale: 0-10 Pain Score: 0-No pain  Therapy/Group: Individual Therapy  Rich Brave 12/30/2020, 11:16 AM

## 2020-12-31 LAB — CBC
HCT: 40.8 % (ref 39.0–52.0)
Hemoglobin: 14.2 g/dL (ref 13.0–17.0)
MCH: 31.8 pg (ref 26.0–34.0)
MCHC: 34.8 g/dL (ref 30.0–36.0)
MCV: 91.5 fL (ref 80.0–100.0)
Platelets: 83 10*3/uL — ABNORMAL LOW (ref 150–400)
RBC: 4.46 MIL/uL (ref 4.22–5.81)
RDW: 12.7 % (ref 11.5–15.5)
WBC: 6.8 10*3/uL (ref 4.0–10.5)
nRBC: 0 % (ref 0.0–0.2)

## 2020-12-31 LAB — BASIC METABOLIC PANEL
Anion gap: 5 (ref 5–15)
BUN: 24 mg/dL — ABNORMAL HIGH (ref 8–23)
CO2: 26 mmol/L (ref 22–32)
Calcium: 9.8 mg/dL (ref 8.9–10.3)
Chloride: 107 mmol/L (ref 98–111)
Creatinine, Ser: 1.39 mg/dL — ABNORMAL HIGH (ref 0.61–1.24)
GFR, Estimated: 54 mL/min — ABNORMAL LOW (ref >60)
Glucose, Bld: 109 mg/dL — ABNORMAL HIGH (ref 70–99)
Potassium: 4.1 mmol/L (ref 3.5–5.1)
Sodium: 138 mmol/L (ref 135–145)

## 2020-12-31 MED ORDER — POLYETHYLENE GLYCOL 3350 17 G PO PACK
17.0000 g | PACK | Freq: Every day | ORAL | Status: DC
  Administered 2020-12-31 – 2021-01-07 (×6): 17 g via ORAL
  Filled 2020-12-31 (×11): qty 1

## 2020-12-31 MED ORDER — SENNOSIDES-DOCUSATE SODIUM 8.6-50 MG PO TABS
2.0000 | ORAL_TABLET | Freq: Every day | ORAL | Status: DC
  Administered 2020-12-31 – 2021-01-11 (×12): 2 via ORAL
  Filled 2020-12-31 (×12): qty 2

## 2020-12-31 NOTE — Progress Notes (Signed)
Physical Therapy Session Note  Patient Details  Name: Juan Brewer MRN: [id number] Date of Birth: [birth year]  Today's Date: 12/31/2020 PT Individual Time: 0800-0855 PT Individual Time Calculation (min): 55 min   Short Term Goals: Week 1:  PT Short Term Goal 1 (Week 1): pt will transfer sit<>stand with LRAD and CGA PT Short Term Goal 2 (Week 1): pt will transfer bed<>chair with LRAD and CGA PT Short Term Goal 3 (Week 1): pt will ambulate 38ft with LRAD and max of 1  Skilled Therapeutic Interventions/Progress Updates:   Received pt supine in bed with RN present at bedside, pt agreeable to therapy, and denied any pain during session. Session with emphasis on functional mobility/transfers, generalized strengthening, dynamic standing balance/coordination, NMR, gait training, and improved activity tolerance. Pt able to void in urinal with set up assist and transferred supine<>sitting EOB with supervision and use of bedrails with cues to get RLE off bed prior to sitting up. Donned shoes with total A with cues for anterior weight shifting as pt with posterior lean while sitting. Stand<>pivot bed<>WC with RW and min/mod A with cues for RW safety and sequencing with turning. Pt with tendency to turn and sit prior to backing up with RW and without reaching back for armrests despite cues. Pt transported to dayroom in [facility] total A for time management purposes and ambulated 31ft with RW and min fading to mod A. Pt with crouched gait pattern with R knee flexed in stance and increasing significantly with fatigue almost resulting in R knee buckling; therefore pulled up chair for pt to sit. Educated pt on energy conservation and self-recognition of fatigue. Transferred sit<>stand at table in dayroom x 2 trials with min A and worked on dynamic standing balance constructing picture with pipes with min/mod A for balance with emphasis on R knee extension, functional use of RUE, problem solving and sequencing, and  multi-tasking. Pt with bilateral knees flexed in stance requiring mod cues for upright posture and bilateral knee extension. Pt suddenly with bilateral knee buckling resulting in controlled descent into WC. Educated pt on importance of recognizing onset of fatigue and sitting to rest. Due to fatigue completed remainder of pipe activity seated in WC with mod cues for sequencing and selecting correct size of pipes. Pt transported back to room in [facility] total A and transferred WC<>recliner stand<>pivot with mod A and same cues mentioned above. Concluded session with pt sitting in recliner eating breakfast, needs within reach, and chair pad alarm on.   Therapy Documentation Precautions:  Precautions Precautions: Fall Precaution Comments: R hemi, mild R inattention Restrictions Weight Bearing Restrictions: No  Therapy/Group: Individual Therapy Martin Majestic PT, DPT   12/31/2020, 7:30 AM

## 2020-12-31 NOTE — Progress Notes (Signed)
Occupational Therapy Session Note  Patient Details  Name: Juan Brewer MRN: [id number] Date of Birth: Feb 25, 1946  Today's Date: 12/31/2020 OT Individual Time: 4098-1191 OT Individual Time Calculation (min): 61 min    Short Term Goals: Week 1:  OT Short Term Goal 1 (Week 1): Pt will demonstrate improved RUE coordination to complete eating independently, opening containers himself. OT Short Term Goal 2 (Week 1): Pt will maintain sitting balance EOB with no lateral lean while donning clothing with no cues. OT Short Term Goal 3 (Week 1): Pt will don underwear and pants with CGA for balance only. OT Short Term Goal 4 (Week 1): Pt will be able to don and tie shoes independently.  Skilled Therapeutic Interventions/Progress Updates:   Met pt lying supine in bed, bed alarm on, pt agreed to OT tx. Treatment session was focused on RUE coordination while maintaining sitting and standing balance when completing activities. Pt was Min A from lying > sitting EOB, required OTS to bring shoulders upright. Once on the EOB, pt urinated on himself then requested to use the bathroom. OTS cued pt to change soiled pants, due to decreased awareness pt declined before agreeing. Pt was brought to ortho gym and instructed on BITS activities to increase standing balance and RUE coordination. Once pt would become fatigue, both of his knees would slightly buckle requiring Mod A to maintain upright, pt was not aware and reported "I probably did it subconsciously". Pt has poor motor planning and often sits on the edge of his w/c when sitting, frequently undershooting w/c during transfers. Pt had a delayed reaction when completing visual scanning activities on the BITS. Pt was not able to maintain upright posture by frequently losing balance backwards when bouncing a ball back and forth. Once brought back to room, pt brushed his teeth while sitting. Pt was Min A for stand pivot to EOB from w/c. Throughout session, pt demonstrated  poor sequencing and required cues to complete tasks. Pt will continue to benefit from further education on safety awareness during transfers. Left pt lying supine in bed, bed alarm on, all needs met.   Therapy Documentation Precautions:  Precautions Precautions: Fall Precaution Comments: R hemi, mild R inattention Restrictions Weight Bearing Restrictions: No   Pain: Pain Assessment Pain Scale: 0-10 Pain Score: 0-No pain   Therapy/Group: Individual Therapy  Sean Malinowski 12/31/2020, 3:09 PM

## 2020-12-31 NOTE — Progress Notes (Signed)
Occupational Therapy Session Note  Patient Details  Name: Juan Brewer MRN: [id number] Date of Birth: 03-15-1946  Today's Date: 12/31/2020 OT Individual Time: 9833-8250 OT Individual Time Calculation (min): 70 min    Short Term Goals: Week 1:  OT Short Term Goal 1 (Week 1): Pt will demonstrate improved RUE coordination to complete eating independently, opening containers himself. OT Short Term Goal 2 (Week 1): Pt will maintain sitting balance EOB with no lateral lean while donning clothing with no cues. OT Short Term Goal 3 (Week 1): Pt will don underwear and pants with CGA for balance only. OT Short Term Goal 4 (Week 1): Pt will be able to don and tie shoes independently.  Skilled Therapeutic Interventions/Progress Updates:    Treatment session with focus on self-care retraining, functional transfers, dynamic standing balance, and RUE NMR.  Pt received supine in bed agreeable to therapy session.  Pt completed bed mobility with min assist and min cues for sequencing and motor planning.  Therapist providing mod cues fading to min for hand placement during sit > stand.  Pt ambulated in to bathroom with RW with min-mod assist as pt with increasing knee flexion during ambulation as he fatigued.  As pt approaching shower bench, pt sat prematurely nearly missing tub bench.  Therapist providing max assist to correct and educated on improved positioning and awareness prior to sitting.  Pt denied issue due to decreased awareness of impairments. Pt completed bathing at sit > stand level with CGA.  Pt completed dressing from EOB with CGA for LB dressing at sit > stand level.  Pt able to don and tie shoes this session.  Engaged in dynamic standing balance with reaching with RUE with focus on trunk control, as pt with progressive lean to R as he fatigues and/or challenge increases.  Pt demonstrating decreased receptiveness of cues for improved safety awareness, problem solving, and lean during standing. Pt  required mod assist for dynamic standing balance due to R knee flexion and R lean, increased pushing when attempting to correct/draw attention with tactile cues.  Pt returned to room and transferred stand pivot back to bed with min assist and cues to ensure sitting on bed due to premature sitting again. Pt remained semi-reclined in bed with all needs in reach.  Pt frequently "joking" and making mean comments to therapist, especially when being challenged or therapist attempting to draw attention to deficits.    Therapy Documentation Precautions:  Precautions Precautions: Fall Precaution Comments: R hemi, mild R inattention Restrictions Weight Bearing Restrictions: No Pain:  Pt with no c/o pain   Therapy/Group: Individual Therapy  Rosalio Loud 12/31/2020, 12:22 PM

## 2020-12-31 NOTE — Progress Notes (Signed)
PROGRESS NOTE   Subjective/Complaints: Up in chair. Waiting for spoon, coffee with breakfast. Moved bowels yesterday  ROS: Patient denies fever, rash, sore throat, blurred vision, nausea, vomiting, diarrhea, cough, shortness of breath or chest pain, joint or back pain, headache, or mood change.    Objective:   No results found. Recent Labs    12/31/20 0636  WBC 6.8  HGB 14.2  HCT 40.8  PLT 83*   Recent Labs    12/31/20 0636  NA 138  K 4.1  CL 107  CO2 26  GLUCOSE 109*  BUN 24*  CREATININE 1.39*  CALCIUM 9.8    Intake/Output Summary (Last 24 hours) at 12/31/2020 0920 Last data filed at 12/31/2020 0530 Gross per 24 hour  Intake 476 ml  Output 450 ml  Net 26 ml        Physical Exam: Vital Signs Blood pressure 119/78, pulse 63, temperature 97.6 F (36.4 C), temperature source Oral, resp. rate 20, height 5\' 11"  (1.803 m), weight 83 kg, SpO2 94 %. Constitutional: No distress . Vital signs reviewed. HEENT: EOMI, oral membranes moist Neck: supple Cardiovascular: RRR without murmur. No JVD    Respiratory/Chest: CTA Bilaterally without wheezes or rales. Normal effort    GI/Abdomen: BS +, non-tender, non-distended Ext: no clubbing, cyanosis, or edema Psych: pleasant and cooperative Musculoskeletal:     Cervical back: Normal range of motion and neck supple.     Comments: Left elbow boggy olecranon bursa still swollen Skin:    General: Skin is warm and dry.  Neurological:     Mental Status: He is alert.     Comments: Alert Sl HOH Motor: LUE/LLE: 5/5 proximal distal RUE: 4 to 4+/5 proximal and distal with mild ataxia RLE: 4+/5 prox to distal      Assessment/Plan: 1. Functional deficits which require 3+ hours per day of interdisciplinary therapy in a comprehensive inpatient rehab setting.  Physiatrist is providing close team supervision and 24 hour management of active medical problems listed  below.  Physiatrist and rehab team continue to assess barriers to discharge/monitor patient progress toward functional and medical goals  Care Tool:  Bathing    Body parts bathed by patient: Right arm,Left arm,Chest,Abdomen,Buttocks,Right upper leg,Left upper leg,Right lower leg,Left lower leg,Front perineal area,Face         Bathing assist Assist Level: Contact Guard/Touching assist     Upper Body Dressing/Undressing Upper body dressing   What is the patient wearing?: Pull over shirt    Upper body assist Assist Level: Supervision/Verbal cueing    Lower Body Dressing/Undressing Lower body dressing      What is the patient wearing?: Underwear/pull up,Pants     Lower body assist Assist for lower body dressing: Contact Guard/Touching assist     Toileting Toileting    Toileting assist Assist for toileting: Moderate Assistance - Patient 50 - 74%     Transfers Chair/bed transfer  Transfers assist     Chair/bed transfer assist level: Minimal Assistance - Patient > 75%     Locomotion Ambulation   Ambulation assist      Assist level: Minimal Assistance - Patient > 75% Assistive device: Walker-rolling Max distance: 37'  Walk 10 feet activity   Assist  Walk 10 feet activity did not occur: Safety/medical concerns (fatigue, decreased balance, R hemi, decreased motor planning/coordination)  Assist level: Minimal Assistance - Patient > 75% Assistive device: Walker-rolling   Walk 50 feet activity   Assist Walk 50 feet with 2 turns activity did not occur: Safety/medical concerns (fatigue, decreased balance, R hemi, decreased motor planning/coordination)  Assist level: Minimal Assistance - Patient > 75% Assistive device: Walker-rolling    Walk 150 feet activity   Assist Walk 150 feet activity did not occur: Safety/medical concerns (fatigue, decreased balance, R hemi, decreased motor planning/coordination)         Walk 10 feet on uneven surface   activity   Assist Walk 10 feet on uneven surfaces activity did not occur: Safety/medical concerns         Wheelchair     Assist Will patient use wheelchair at discharge?: Yes Type of Wheelchair: Manual    Wheelchair assist level: Supervision/Verbal cueing Max wheelchair distance: 65ft    Wheelchair 50 feet with 2 turns activity    Assist        Assist Level: Supervision/Verbal cueing   Wheelchair 150 feet activity     Assist      Assist Level: Maximal Assistance - Patient 25 - 49%   Blood pressure 119/78, pulse 63, temperature 97.6 F (36.4 C), temperature source Oral, resp. rate 20, height 5\' 11"  (1.803 m), weight 83 kg, SpO2 94 %.  Medical Problem List and Plan: 1. 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               -patient may shower             -ELOS/Goals: 14 to 17 days/min a          -Continue CIR therapies including PT, OT   2.  Antithrombotics: -DVT/anticoagulation:  SCDs/TEDs--d/ced Lovenox due to low platelets.              -antiplatelet therapy: ASA daily.  3. Pain Management: Tylenol prn. Continue lidocaine patch for low back pain.  4. Mood: LCSW to follow for evaluation and support.              -antipsychotic agents: N/A  5. Neuropsych: This patient is capable of making decisions on his own behalf. 6. Skin/Wound Care: Routine pressure relief measures.  7. Fluids/Electrolytes/Nutrition: Monitor I/Os.            encourage PO, good appetite  6/6 I personally reviewed the patient's labs today.   8.  HTN: Monitor blood pressures TID--continue tenormin daily.              6/6  controlled 9.  Thrombocytopenia: 6/6 platelets remain in 80-90k range, no bleeding   -continue monitor serially 10.  Hyperglycemia: Hemoglobin A1c 5.5 and likely stress related. 11.  COPD: Stable on Anoro 12.  Dyslipidemia:.              --on Lovaza and Zocor 13.  AKI on CKD?: Serum creatinine 1.5 at admission. Continue to monitor.               -encouraging fluids.   6/6 labs stable 14.  Left elbow cystic lesion: likely olecranon bursitis.  Outpatient f/u with [facility] 15. Anxiety/depresion: Continues on home regimen of Prozac 40 mg, Xanax 0.5 mg qid and Triazolam 0.25 mg/hs for insomnia.  16. Constipation:  Colace with supper. Added prune juice with meals. Mag citrate after therapy on 6/5.   6/6 results yesterday but stools very hard  -change colace to senna-s at bedtime with miralax in the am 17. Tobacco abuse: continue nicotine patch 18. Bradycardia: continue to monitor TID      LOS: 5 days A FACE TO FACE EVALUATION WAS PERFORMED  Ranelle Oyster 12/31/2020, 9:20 AM

## 2021-01-01 NOTE — Progress Notes (Signed)
PROGRESS NOTE   Subjective/Complaints: No complaint this morning Bowel regimen helping Poor awareness of deficits as per therapy, SLP to eval today He is very pleasant  ROS: Patient denies fever, rash, sore throat, blurred vision, nausea, vomiting, diarrhea, cough, shortness of breath or chest pain, joint or back pain, headache, or mood change.    Objective:   No results found. Recent Labs    12/31/20 0636  WBC 6.8  HGB 14.2  HCT 40.8  PLT 83*   Recent Labs    12/31/20 0636  NA 138  K 4.1  CL 107  CO2 26  GLUCOSE 109*  BUN 24*  CREATININE 1.39*  CALCIUM 9.8    Intake/Output Summary (Last 24 hours) at 01/01/2021 1039 Last data filed at 01/01/2021 0836 Gross per 24 hour  Intake 840 ml  Output 400 ml  Net 440 ml        Physical Exam: Vital Signs Blood pressure 110/76, pulse (!) 59, temperature 98.3 F (36.8 C), resp. rate 16, height 5\' 11"  (1.803 m), weight 83 kg, SpO2 94 %. Gen: no distress, normal appearing HEENT: oral mucosa pink and moist, NCAT Cardio: Bradycardic Chest: normal effort, normal rate of breathing Abd: soft, non-distended Ext: no edema Musculoskeletal:     Cervical back: Normal range of motion and neck supple.     Comments: Left elbow boggy olecranon bursa still swollen Skin:    General: Skin is warm and dry.  Neurological:     Mental Status: He is alert.     Comments: Alert Sl HOH Motor: LUE/LLE: 5/5 proximal distal RUE: 4 to 4+/5 proximal and distal with mild ataxia RLE: 4+/5 prox to distal   Assessment/Plan: 1. Functional deficits which require 3+ hours per day of interdisciplinary therapy in a comprehensive inpatient rehab setting.  Physiatrist is providing close team supervision and 24 hour management of active medical problems listed below.  Physiatrist and rehab team continue to assess barriers to discharge/monitor patient progress toward functional and medical  goals  Care Tool:  Bathing    Body parts bathed by patient: Right arm,Left arm,Chest,Abdomen,Buttocks,Right upper leg,Left upper leg,Right lower leg,Left lower leg,Front perineal area,Face         Bathing assist Assist Level: Contact Guard/Touching assist     Upper Body Dressing/Undressing Upper body dressing   What is the patient wearing?: Pull over shirt    Upper body assist Assist Level: Supervision/Verbal cueing    Lower Body Dressing/Undressing Lower body dressing      What is the patient wearing?: Incontinence brief,Pants     Lower body assist Assist for lower body dressing: Minimal Assistance - Patient > 75%     Toileting Toileting    Toileting assist Assist for toileting: Moderate Assistance - Patient 50 - 74%     Transfers Chair/bed transfer  Transfers assist     Chair/bed transfer assist level: Moderate Assistance - Patient 50 - 74%     Locomotion Ambulation   Ambulation assist      Assist level: Moderate Assistance - Patient 50 - 74% Assistive device: Walker-rolling Max distance: 35'   Walk 10 feet activity   Assist  Walk 10 feet activity did  not occur: Safety/medical concerns (fatigue, decreased balance, R hemi, decreased motor planning/coordination)  Assist level: Moderate Assistance - Patient - 50 - 74% Assistive device: Walker-rolling   Walk 50 feet activity   Assist Walk 50 feet with 2 turns activity did not occur: Safety/medical concerns (fatigue, decreased balance, R hemi, decreased motor planning/coordination)  Assist level: Minimal Assistance - Patient > 75% Assistive device: Walker-rolling    Walk 150 feet activity   Assist Walk 150 feet activity did not occur: Safety/medical concerns (fatigue, decreased balance, R hemi, decreased motor planning/coordination)         Walk 10 feet on uneven surface  activity   Assist Walk 10 feet on uneven surfaces activity did not occur: Safety/medical concerns          Wheelchair     Assist Will patient use wheelchair at discharge?: Yes Type of Wheelchair: Manual    Wheelchair assist level: Supervision/Verbal cueing Max wheelchair distance: 79ft    Wheelchair 50 feet with 2 turns activity    Assist        Assist Level: Supervision/Verbal cueing   Wheelchair 150 feet activity     Assist      Assist Level: Maximal Assistance - Patient 25 - 49%   Blood pressure 110/76, pulse (!) 59, temperature 98.3 F (36.8 C), resp. rate 16, height 5\' 11"  (1.803 m), weight 83 kg, SpO2 94 %.  Medical Problem List and Plan: 1. 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               -patient may shower             -ELOS/Goals: 14 to 17 days/min a          -Continue CIR therapies including PT, OT   2.  Impaired mobility: -DVT/anticoagulation:  SCDs/TEDs--d/ced Lovenox due to low platelets. PC 83 on 6/6.              -antiplatelet therapy: Continue ASA daily.  3. Pain Management: Tylenol prn. Continue lidocaine patch for low back pain.  4. Mood: LCSW to follow for evaluation and support.              -antipsychotic agents: N/A  5. Neuropsych: This patient is capable of making decisions on his own behalf. 6. Skin/Wound Care: Routine pressure relief measures.  7. Fluids/Electrolytes/Nutrition: Monitor I/Os.            encourage PO, good appetite  6/6 I personally reviewed the patient's labs 8.  HTN: continue to monitor blood pressures TID--continue tenormin daily.              6/7  controlled 9.  Thrombocytopenia: 6/6 platelets remain in 80-90k range, no bleeding   -continue monitor serially 10.  Hyperglycemia: Hemoglobin A1c 5.5 and likely stress related. 11.  COPD: Stable on Anoro 12.  Dyslipidemia:.             --on Lovaza and Zocor 13.  AKI on CKD?: Serum creatinine 1.5 at admission. Continue to monitor.                -encouraging fluids.   6/6 labs stable 14.  Left elbow cystic lesion: likely olecranon bursitis.  Outpatient f/u with [facility] 15. Anxiety/depresion: Continues on home regimen of Prozac 40 mg, Xanax 0.5 mg qid and Triazolam 0.25 mg/hs for insomnia.   16. Constipation:  Colace with supper.  Added prune juice with meals. Mag citrate after therapy on 6/5.   -continue senna-s at bedtime with miralax in the am 17. Tobacco abuse: continue nicotine patch 18. Bradycardia: continue to monitor TID      LOS: 6 days A FACE TO FACE EVALUATION WAS PERFORMED  Drema Pry Salvator Seppala 01/01/2021, 10:39 AM

## 2021-01-01 NOTE — Patient Care Conference (Signed)
Inpatient RehabilitationTeam Conference and Plan of Care Update Date: 01/01/2021   Time: 1:11 PM    Patient Name: Juan Brewer      Medical Record Number: [id number]  Date of Birth: March 04, 1946 Sex: Male         Room/Bed: 4M02C/4M02C-01 Payor Info: Payor: MEDICARE / Plan: MEDICARE PART A AND B / Product Type: *No Product type* /    Admit Date/Time:  12/26/2020  2:32 PM  Primary Diagnosis:  CVA (cerebral vascular accident) [facility])  Hospital Problems: Principal Problem:   CVA (cerebral vascular accident) [facility])    Expected Discharge Date: Expected Discharge Date: 01/13/21  Team Members Present: Physician leading conference: Dr. Sula Soda Care Coodinator Present: Dossie Der, LCSW;Sakeena Teall Marlyne Beards, RN, BSN, CRRN Nurse Present: Kennyth Arnold, RN PT Present: Raechel Chute, PT OT Present: Rosalio Loud, OT PPS Coordinator present : Edson Snowball, PT     Current Status/Progress Goal Weekly Team Focus  Bowel/Bladder   Patient is continent of B/B. LBM 12/30/20.  Patient will remain continent x2.  Offer toileting q2h qshift and prn.   Swallow/Nutrition/ Hydration             ADL's   Min-mod assist bathing, Min assist UB and LB dressing, CGA sit > stand however requiring up to mod-max assist for dynamic standing balance due to R knee flexion and decreased safety awareness.  Pt with decreased safety awareness and decreased motor planning, and frequently "jokes" to cover up for impairments  Supervision  ADL retraining, transfers, dynamic standing balance, awareness   Mobility   bed mobility min A, transfers with RW mod A, gait 66ft with RW mod A  supervision WC level transfers, CGA sit<>stands, and min A gait and steps, supervision WC mobility  functional mobility/transfers, generalized strengthening, dynamic standing balance/coordination, safety awareness, motor planning, sequencing, and improved endurance.   Communication             Safety/Cognition/ Behavioral Observations             Pain   Patient denies pain at this time.  Patient will have a pain level of <=2.  Assess patient for pain q shift and prn. Administer prn pain medication per order.   Skin   Patient has no skin breakdown.  Patients skin will remain intact and free from infection.  Assess patients skin q shift and prn for s/s of breakdown/infection.     Discharge Planning:  Home with wife who can provide assist but does work so will need to make arrangements for him.   Team Discussion: Bowel movement yesterday, increasing bowel medications. Cyst on left elbow will aspirate post discharge. Incontinent urine, refusing miralax. Chronic 5/10 pain. Discharging home with wife. Patient on target to meet rehab goals: Poor safety awareness, makes jokes to cover. Min assist for bed mobility, min/mod assist for transfers, min assist gait and falls to the right, very poor awareness. Will accuse staff of "pushing him over." Supervision W/C level. Has no awareness for safety, both knees buckle, min/mod assist overall. Fatigues quickly, needs very close supervision. SLP eval tomorrow.  *See Care Plan and progress notes for long and short-term goals.   Revisions to Treatment Plan:  Monitor bowels, and Cr.  Teaching Needs: Family education, medication management, pain management, skin/wound care, transfer training, gait training, balance training, endurance training, safety awareness.  Current Barriers to Discharge: Decreased caregiver support, Medical stability, Home enviroment access/layout, Incontinence, Wound care, Lack of/limited family support, Medication compliance and Behavior  Possible Resolutions to  Barriers: Continue current medications, provide emotional support.     Medical Summary Current Status: left elbow cyst, poor awareness of deficits, constipation, bradycardia, AKI on CKD  Barriers to Discharge: Medical stability  Barriers to Discharge Comments: left elbow cyst, poor awareness of deficits,  constipation, bradycardia, AKI on CKD Possible Resolutions to Barriers/Weekly Focus: outpatient aspiration, continue to monitor elbow cyst, magnesium citrate over the weekend and increase stool softener regimen, monitor Cr   Continued Need for Acute Rehabilitation Level of Care: The patient requires daily medical management by a physician with specialized training in physical medicine and rehabilitation for the following reasons: Direction of a multidisciplinary physical rehabilitation program to maximize functional independence : Yes Medical management of patient stability for increased activity during participation in an intensive rehabilitation regime.: Yes Analysis of laboratory values and/or radiology reports with any subsequent need for medication adjustment and/or medical intervention. : Yes   I attest that I was present, lead the team conference, and concur with the assessment and plan of the team.   Tennis Must 01/01/2021, 6:31 PM

## 2021-01-01 NOTE — Progress Notes (Signed)
Physical Therapy Session Note  Patient Details  Name: Juan Brewer MRN: [id number] Date of Birth: 01-Jul-1946  Today's Date: 01/01/2021 PT Individual Time: 1500-1545 PT Individual Time Calculation (min): 45 min   Short Term Goals: Week 1:  PT Short Term Goal 1 (Week 1): pt will transfer sit<>stand with LRAD and CGA PT Short Term Goal 2 (Week 1): pt will transfer bed<>chair with LRAD and CGA PT Short Term Goal 3 (Week 1): pt will ambulate 77ft with LRAD and max of 1  Skilled Therapeutic Interventions/Progress Updates:    Pt received seated in w/c in room, agreeable to PT session. No complaints of pain. Pt's brief found to have slid down in pants, needs encouragement to allow therapist to assist with fixing brief placement. Sit to stand with min A to RW. Pt able to remain standing with RW and CGA for balance while brief adjusted. Dependent transport via w/c to therapy gym for time and energy conservation. Stand pivot transfer w/c to/from mat table with min A. Pt with poor body awareness during transfer and unsafe RW use pushing RW away from him during transfer, requires cues for safety. Pt very resistant to suggestions about how to utilize equipment in a more safe manner. Standing balance reaching for level yellow through level black clothespins with RUE to L side and placing clothespins on basketball net on R side. Pt requires cues to not utilize LUE to assist with task, min A for balance increasing to mod A with onset of fatigue due to knees started to flex. Pt minimally able to fully extend knees with cueing. Standing alt L/R 4" step taps with RW and mod A for balance, 3 x 10 reps. Pt with frequent LOB posteriorly, requires max cueing for safety and awareness of LOB. Pt states "you didn't tie by shoes so that I why I lost my balance", demonstrates poor insight into balance deficits. Manual w/c propulsion x 100 ft with min A due to difficulty maintaining a straight pathway due to RUE weakness. Pt  requests to return to bed at end of session. Stand pivot transfer w/c to bed with RW and min A. Sit to supine Supervision. Pt left seated in bed with needs in reach, bed alarm in place at end of session.  Therapy Documentation Precautions:  Precautions Precautions: Fall Precaution Comments: R hemi, mild R inattention Restrictions Weight Bearing Restrictions: No   Therapy/Group: Individual Therapy   Peter Congo, PT, DPT, CSRS  01/01/2021, 5:08 PM

## 2021-01-01 NOTE — Progress Notes (Signed)
RT note-patient states that he self administered

## 2021-01-01 NOTE — Progress Notes (Signed)
Occupational Therapy Session Note  Patient Details  Name: Deven Furia MRN: [id number] Date of Birth: 04-02-1946  Today's Date: 01/01/2021 OT Individual Time: 1660-6004 OT Individual Time Calculation (min): 59 min    Short Term Goals: Week 1:  OT Short Term Goal 1 (Week 1): Pt will demonstrate improved RUE coordination to complete eating independently, opening containers himself. OT Short Term Goal 2 (Week 1): Pt will maintain sitting balance EOB with no lateral lean while donning clothing with no cues. OT Short Term Goal 3 (Week 1): Pt will don underwear and pants with CGA for balance only. OT Short Term Goal 4 (Week 1): Pt will be able to don and tie shoes independently.  Skilled Therapeutic Interventions/Progress Updates:   Met pt lying supine in bed, bed alarm on, pt agreed to OT tx. Treatment session was focused on self-care tasks and activities while maintaining sitting balance. Pt was CGA for lying > sitting EOB and primarily used bed rails to bring UB upright. Pt was able to doff UB clothing with supervision, LB clothing with Min A while sitting EOB. Pt laterally leaned to scoot pants off, requiring cues for sequencing. OTS instructed pt to do a squat pivot, pt stand pivoted to w/c to go to bathroom. Pt used grab bars in shower to enter and exit shower. Pt is able to complete stand pivots but in smaller places it is safer to complete squat pivots. Pt was supervision for bathing, pt completed bathing all of body while sitting. Pt requested to brush teeth and was set-up. Pt was supervision for UB dressing.  Pt was Min A for LB dressing, needing assistance with securing incontinence brief. Pt was Min A for applying footwear due to difficulty with donning ted hose. Pt was brought to rehab gym and instructed to use the rebounder to address sitting balance. Pt was able to maintain balance but required cues for upright posture. OTS provided increased time for set-up for transfers and to increase  awareness for safety. Pt followed safe sit > stand transfers but required cues to use 1 hand to push from surfaces. Pt required cues to adjust body in bed. Left pt lying supine in bed, bed alarm on, all needs met.   Therapy Documentation Precautions:  Precautions Precautions: Fall Precaution Comments: R hemi, mild R inattention Restrictions Weight Bearing Restrictions: No   Vital Signs: Therapy Vitals Temp: 98.3 F (36.8 C) Pulse Rate: (!) 59 Resp: 16 BP: 110/76 Patient Position (if appropriate): Lying Oxygen Therapy SpO2: 94 % O2 Device: Room Air Pain: Pain Assessment Pain Scale: 0-10 Pain Score: 0-No pain   Therapy/Group: Individual Therapy  [name] 01/01/2021, 8:47 AM

## 2021-01-01 NOTE — Progress Notes (Signed)
Physical Therapy Session Note  Patient Details  Name: Juan Brewer MRN: [id number] Date of Birth: November 13, 1945  Today's Date: 01/01/2021 PT Individual Time: 1100-1157 PT Individual Time Calculation (min): 57 min   Short Term Goals: Week 1:  PT Short Term Goal 1 (Week 1): pt will transfer sit<>stand with LRAD and CGA PT Short Term Goal 2 (Week 1): pt will transfer bed<>chair with LRAD and CGA PT Short Term Goal 3 (Week 1): pt will ambulate 46ft with LRAD and max of 1  Skilled Therapeutic Interventions/Progress Updates:   Received pt supine in bed asleep, pt woke to verbal cues and agreeable to therapy, and denied any pain during session. Session with emphasis on functional mobility/transfers, generalized strengthening, dynamic standing balance/coordination, gait training, safety awareness, NMR, and improved activity tolerance. Pt required increased time to initiate mobility this morning due to making phone calls prior to getting OOB. Pt transferred supine<>sitting EOB flat bed with supervision and donned shoes sitting EOB with max A for time management purposes. Stand<>pivot bed<>WC with RW and min A with cues for RW safety and to reach back for armrests prior to sitting. Pt transported to dayroom in [facility] total A for time management purposes and ambulated 16ft x 2 trials with RW and min A on trial 1 and mod A on trial 2. Pt demonstrated improvements in upright posture with ability to control bilateral knee flexion in stance during trial 1 but demonstrated 1 R lateral LOB requiring heavy mod/max A to correct during trial 2; with decreased awareness of balance deficits. Worked on dynamic standing balance performing L/R LE toe taps to 3 colored cones based off therapist's cues x10 bilaterally with BUE support on RW and min A. Worked on mini squats 3x10 mini squats with min A for balance with BUE support and cues to widen BOS and for R knee extension in stance. Transitioned to alternating toe taps to 3in step  2x10 with R handheld assist and mod A fading to max A for balance with 1 posterior LOB onto mat; cues to widen BOS and to slow down. Stand<>pivot mat<>WC with RW and min A and transported back to room in [facility] total A. Pt requested to return to bed to use urinal and transferred WC<>bed with RW and mod A and sit<>supine with supervision but cues for midline positioning in bed as pt initially lying crooked. Pt able to use urinal with set up assist. Concluded session with pt supine in bed, needs within reach, and bed alarm on.   Therapy Documentation Precautions:  Precautions Precautions: Fall Precaution Comments: R hemi, mild R inattention Restrictions Weight Bearing Restrictions: No  Therapy/Group: Individual Therapy Martin Majestic PT, DPT   01/01/2021, 7:22 AM

## 2021-01-01 NOTE — Progress Notes (Signed)
Physical Therapy Session Note  Patient Details  Name: Juan Brewer MRN: [id number] Date of Birth: Jun 03, 1946  Today's Date: 01/01/2021 PT Individual Time: 1415-1500 PT Individual Time Calculation (min): 45 min   Short Term Goals: Week 1:  PT Short Term Goal 1 (Week 1): pt will transfer sit<>stand with LRAD and CGA PT Short Term Goal 2 (Week 1): pt will transfer bed<>chair with LRAD and CGA PT Short Term Goal 3 (Week 1): pt will ambulate 64ft with LRAD and max of 1 Week 2:     Skilled Therapeutic Interventions/Progress Updates:    Pain:  Pt reports no pain.  Treatment to tolerance.  Rest breaks and repositioning as needed.  Pt initially supine and agreeable to treatment session.  Supine to sit w/min assist.  stand pivot transfer bed to wc w/min assist.   Pt transported to gym. Gait 145ft including turning at 43ft w/RW, cues to attend to RLE step length, clearance which declines w/fatigue.  Pt also w/R knee wobbles at loading which progressed to buckling w/fatigue, pt unaware of buckling.  Overall cga to mod assist w/buckling.    Standing step up onto 6in step w/2 rails repeatedly leading w/RLE, repeated 2 sets.    Stairs: ascended/descended 4stairs w/2 rails w/min assist, max cues for sequencing and attention to r hemibody.  wc propulsion w/bilat LEs for bipedal strengthening activity x 138ft w/cues to attend to R environment.  Standing w/RW high marching w/cues to focus on R quad activation for stability.  Pt left oob in wc w/alarm belt set and needs in reach    Therapy Documentation Precautions:  Precautions Precautions: Fall Precaution Comments: R hemi, mild R inattention Restrictions Weight Bearing Restrictions: No    Therapy/Group: Individual Therapy  Rada Hay, PT   [name] 01/01/2021, 3:04 PM

## 2021-01-02 DIAGNOSIS — I639 Cerebral infarction, unspecified: Secondary | ICD-10-CM

## 2021-01-02 MED ORDER — SORBITOL 70 % SOLN
30.0000 mL | Freq: Every day | Status: DC | PRN
  Filled 2021-01-02: qty 30

## 2021-01-02 MED ORDER — ATENOLOL 25 MG PO TABS
12.5000 mg | ORAL_TABLET | Freq: Every day | ORAL | Status: DC
  Administered 2021-01-02 – 2021-01-11 (×10): 12.5 mg via ORAL
  Filled 2021-01-02 (×10): qty 1

## 2021-01-02 NOTE — Progress Notes (Signed)
Occupational Therapy Session Note  Patient Details  Name: Juan Brewer MRN: [id number] Date of Birth: 1946/01/18  Today's Date: 01/02/2021 OT Individual Time: 6010-9323 OT Individual Time Calculation (min): 77 min    Short Term Goals: Week 1:  OT Short Term Goal 1 (Week 1): Pt will demonstrate improved RUE coordination to complete eating independently, opening containers himself. OT Short Term Goal 2 (Week 1): Pt will maintain sitting balance EOB with no lateral lean while donning clothing with no cues. OT Short Term Goal 3 (Week 1): Pt will don underwear and pants with CGA for balance only. OT Short Term Goal 4 (Week 1): Pt will be able to don and tie shoes independently.  Skilled Therapeutic Interventions/Progress Updates:   Met pt lying supine in bed slightly reclined, bed alarm on, pt agreed to OT tx. Treatment session was focused on self-care and increasing dynamic sitting balance while completing activities. OTS required to take off ted hose while lying in bed. Pt was Min A for lying > sitting EOB needing assistance with bringing UB upright. Pt squat pivoted from EOB > w/c with Min A. Pt requested to try to use the bathroom and reported "I don't know if I need to go but I want to try." Pt was Min A for toileting. Pt doffed all clothing while sitting on the toilet. Pt squat pivoted from w/c to shower bench holding onto grab bars to lower onto bench. Pt washed hair but neglected to wash any body parts without OTS sequencing cues. Pt was set-up for oral care tasks sitting at sink. Pt donned UB dressing with supervision while sitting in w/c. Pt put on LB dressing with Min A needing assistance to secure incontinence brief while standing with RW. Pt was Min A for putting on footwear due to needing full assistance with putting on ted hose. Pt was brought to main gym to complete dynamic sitting balance activity sitting on K-bench. Pt was able to scoot from gym mat to K-bench with Min A needing  physical cues. Pt was instructed to use RUE to cross midline and bring cards to board placed in front of him to facilitate anterior weight-shift. Pt experienced difficulty with remaining upright due to posterior lean. Once fatigued, pt heavily reclined on OTS while crossing midline.Throughout session, pt required cues to push from surfaces when transferring for safety. Pt required cues to adjust body in bed. Left pt lying supine in bed, bed alarm on, all needs met.   Therapy Documentation Precautions:  Precautions Precautions: Fall Precaution Comments: R hemi, mild R inattention Restrictions Weight Bearing Restrictions: No General:   Vital Signs:   Pain: Pain Assessment Pain Scale: 0-10 Pain Score: 0-No pain ADL: ADL Eating: Set up Grooming: Setup Upper Body Bathing: Supervision/safety Where Assessed-Upper Body Bathing: Shower Lower Body Bathing: Contact guard Where Assessed-Lower Body Bathing: Shower Upper Body Dressing: Supervision/safety Lower Body Dressing: Minimal assistance Where Assessed-Lower Body Dressing: Wheelchair Toileting: Minimal assistance Where Assessed-Toileting: Glass blower/designer: Psychiatric nurse Method: Engineer, water: Grab bars,Raised Counselling psychologist: Environmental education officer Method: Education officer, environmental: Transfer tub bench,Grab bars   Therapy/Group: Individual Therapy  Caliana Spires 01/02/2021, 9:00 AM

## 2021-01-02 NOTE — Progress Notes (Signed)
Patient ID: Juan Brewer, male   DOB: [birth year], 75 y.o.   MRN: [id number]  Met with pt and message left of wife regarding team conference update goals of min assist and target discharge date of 6/19. Pt is aware he will need for assist at discharge. He reports his wife works at [employer] and is in charge of summer camp program which just started this week. Will leave wife another message and work on discharge needs.

## 2021-01-02 NOTE — Evaluation (Signed)
Speech Language Pathology Assessment and Plan  Patient Details  Name: Juan Brewer MRN: [id number] Date of Birth: 1946-04-21  SLP Diagnosis: Cognitive Impairments  Rehab Potential: Good ELOS: 6/19    Today's Date: 01/02/2021 SLP Individual Time: 3212-2482 SLP Individual Time Calculation (min): 86 min   Hospital Problem: Principal Problem:   CVA (cerebral vascular accident) [facility])  Past Medical History:  Past Medical History:  Diagnosis Date  . Hypertension    Past Surgical History:  Past Surgical History:  Procedure Laterality Date  . CHOLECYSTECTOMY      Assessment / Plan / Recommendation  Juan Brewer is a 75 year old male with history of HTN, COPD, syncope w/TIA event 01/22 who was admitted on 12/21/2020 with right facial droop and difficulty walking.  History taken from chart review and patient.  UDS was positive for benzos.  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  MRI report showed left lentiform nucleus/corona radiata and moderate chronic microvascular ischemic disease.  Echocardiogram ejection fraction of 60 to 65%, negative for cardiac pathology.  Patient had been on aspirin every other day with daily Plavix due to question of cystic lesion versus hemarthrosis left elbow which had been recently aspirated.  He was found to be thrombocytopenia at admission with platelet 96.  Dr. Erlinda Hong felt the stroke was due to small vessel disease and recommended decreasing DVT daily low-dose aspirin and close follow-up with PCP after discharge.  Therapy evaluations were done revealing deficits due to right-sided hemiparesis with decreased balance as well as ataxic RLE.  CIR was recommended due to functional decline.  Please see preadmission assessment from earlier today as well. Clinical Impression Patient presents with an overall mild-mod cognitive impairment with deficits in awareness, memory, attention and problem solving. Prior to  formal assessment (via CLQT), patient was denying any difficulties but when testing completed and SLP asked patient how he thought he had performed, he demonstrated awareness and did appear a little surprised at how difficult several of the tasks were. Scores for CLQT were as follows: Attention: Moderate impairment; Memory:mild impairment; Executive functions: moderate impairment. When SLP then explained purpose of ST services while patient in CIR (working on cognitive deficits), he did appear receptive to this. 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 ST services warranted to maximize patient's cognitive-linguistic functioning to improve safety awareness, reasoning, problem solving and memory.  Skilled Therapeutic Interventions          SLE, CLQT  SLP Assessment  Patient will need skilled Speech Lanaguage Pathology Services during CIR admission    Recommendations  Recommendations for Other Services: Neuropsych consult Patient destination: Home Follow up Recommendations: Outpatient SLP Equipment Recommended: None recommended by SLP    SLP Frequency 3 to 5 out of 7 days   SLP Duration  SLP Intensity  SLP Treatment/Interventions 6/19  Minumum of 1-2 x/day, 30 to 90 minutes  Cognitive remediation/compensation;Cueing hierarchy;Functional tasks;Patient/family education;Medication managment    Pain Pain Assessment Pain Scale: 0-10 Pain Score: 0-No pain  Prior Functioning Cognitive/Linguistic Baseline: Within functional limits Type of Home: House  Lives With: Spouse Available Help at Discharge: Family;Available 24 hours/day Vocation: Full time employment (Forensic psychologist)  SLP Evaluation Cognition Overall Cognitive Status: Impaired/Different from baseline Arousal/Alertness: Awake/alert Orientation Level: Oriented X4 Attention: Selective Sustained  Attention: Appears intact Selective Attention: Impaired Selective  Attention Impairment: Verbal complex;Functional basic;Functional complex Memory: Impaired Memory Impairment: Retrieval deficit;Storage deficit Awareness: Impaired Awareness Impairment: Emergent impairment Problem Solving: Impaired Problem Solving Impairment: Verbal complex;Functional complex Executive Function: Reasoning;Self Monitoring Reasoning: Impaired Reasoning Impairment: Verbal complex;Functional basic Self Monitoring: Impaired Self Monitoring Impairment: Functional complex;Functional basic Safety/Judgment: Impaired Comments: decreased insight into deficits  Comprehension Auditory Comprehension Overall Auditory Comprehension: Appears within functional limits for tasks assessed Expression Expression Primary Mode of Expression: Verbal Verbal Expression Overall Verbal Expression: Appears within functional limits for tasks assessed Oral Motor Oral Motor/Sensory Function Overall Oral Motor/Sensory Function: Within functional limits Motor Speech Overall Motor Speech: Appears within functional limits for tasks assessed  Care Tool Care Tool Cognition Expression of Ideas and Wants Expression of Ideas and Wants: Some difficulty - exhibits some difficulty with expressing needs and ideas (e.g, some words or finishing thoughts) or speech is not clear   Understanding Verbal and Non-Verbal Content Understanding Verbal and Non-Verbal Content: Usually understands - understands most conversations, but misses some part/intent of message. Requires cues at times to understand   Memory/Recall Ability *first 3 days only       Short Term Goals: Week 1: SLP Short Term Goal 1 (Week 1): Patient will complete medication management simulation with minA cues for accuracy. SLP Short Term Goal 2 (Week 1): Patient will complete mild complex level money/financial management tasks with minA cues. SLP Short Term Goal 3 (Week 1): Patient  will perform alternating attention functional tasks with modA cues for attention to and attempts at correction of errors. SLP Short Term Goal 4 (Week 1): Patient will recall specific therapeutic interventions on a daily basis with supervisionA. SLP Short Term Goal 5 (Week 1): Patient will demonstrate anticipatory awareness for impact of his deficits on function, with minA cues.  Refer to Care Plan for Long Term Goals  Recommendations for other services: Neuropsych  Discharge Criteria: Patient will be discharged from SLP if patient refuses treatment 3 consecutive times without medical reason, if treatment goals not met, if there is a change in medical status, if patient makes no progress towards goals or if patient is discharged from hospital.  The above assessment, treatment plan, treatment alternatives and goals were discussed and mutually agreed upon: by patient  Sonia Baller, MA, CCC-SLP Speech Therapy

## 2021-01-02 NOTE — Progress Notes (Signed)
Physical Therapy Session Note  Patient Details  Name: Juan Brewer MRN: [id number] Date of Birth: 1945/08/16  Today's Date: 01/02/2021 PT Individual Time: 1300-1410 PT Individual Time Calculation (min): 70 min   Short Term Goals: Week 1:  PT Short Term Goal 1 (Week 1): pt will transfer sit<>stand with LRAD and CGA PT Short Term Goal 2 (Week 1): pt will transfer bed<>chair with LRAD and CGA PT Short Term Goal 3 (Week 1): pt will ambulate 53ft with LRAD and max of 1  Skilled Therapeutic Interventions/Progress Updates:   Received pt supine in bed finishing lunch, pt agreeable to PT treatment, and denied any pain at rest but reported increased LBP with gait training. RN present to administer Lidocaine patch at end of session. Session with emphasis on functional mobility/transfers, generalized strengthening, dynamic standing balance/coordination, gait training, and improved activity tolerance. Pt transferred supine<>sitting EOB with supervision, increased time, and verbal cues for sequencing. Donned shoes sitting EOB with max A for time management purposes. Pt required 3 attempts and heavy mod A to stand from EOB due to R lateral/posterior LOB and inability to straighten knees. Stand<>pivot bed<>WC with RW and min A with mod cues for sequencing and hand placement. Doffed dirty shirt and donned clean one with supervision and transported pt to dayroom in [facility] total A for time management purposes. Donned LiteGait harness in standing with total A and pt stepped on/off Treadmill with mod A. Worked on gait training on LiteGait Treadmill for the following time frames using mirror for visual feedback: -3 minutes and 31 seconds at 0. for 19ft - manual facilitation to increase RLE step length and for knee extension -3 minutes and 24 seconds at 0.61mph for 197ft -no manual facilitation used just tactile and verbal cues for increased R step length and knee extension -2 minutes and 7 seconds at 0.79mph for 50ft Pt  required multiple rest breaks throughout due to fatigue and ultimately requested to stop on treadmill due to fatigue and increased LBP. Pt transferred on/off Nustep without AD and min A and performed BLE strengthening on Nustep at workload 3 for 5 minutes for a total of 92 steps for improved cardiovascular endurance and reciprocal movement training. Pt transported back to room in [facility] total A and requested to return to bed. Stand<>pivot WC<>bed with min A and sit<>supine with supervision. Concluded session with pt supine in bed, needs within reach, and bed alarm on.   Therapy Documentation Precautions:  Precautions Precautions: Fall Precaution Comments: R hemi, mild R inattention Restrictions Weight Bearing Restrictions: No  Therapy/Group: Individual Therapy Martin Majestic PT, DPT   01/02/2021, 7:20 AM

## 2021-01-02 NOTE — Progress Notes (Signed)
PROGRESS NOTE   Subjective/Complaints: Birthday today , no c/o, discuseed labwork  ROS: Patient denies CP, SOB, N/V/D   Objective:   No results found. Recent Labs    12/31/20 0636  WBC 6.8  HGB 14.2  HCT 40.8  PLT 83*   Recent Labs    12/31/20 0636  NA 138  K 4.1  CL 107  CO2 26  GLUCOSE 109*  BUN 24*  CREATININE 1.39*  CALCIUM 9.8    Intake/Output Summary (Last 24 hours) at 01/02/2021 0736 Last data filed at 01/02/2021 0420 Gross per 24 hour  Intake 680 ml  Output 725 ml  Net -45 ml        Physical Exam: Vital Signs Blood pressure 107/70, pulse 63, temperature 97.8 F (36.6 C), temperature source Oral, resp. rate 14, height 5\' 11"  (1.803 m), weight 83 kg, SpO2 90 %.  General: No acute distress Mood and affect are appropriate Heart: Regular rate and rhythm no rubs murmurs or extra sounds Lungs: Clear to auscultation, breathing unlabored, no rales or wheezes Abdomen: Positive bowel sounds, soft nontender to palpation, nondistended Extremities: No clubbing, cyanosis, or edema Skin: No evidence of breakdown, no evidence of rash  Musculoskeletal:     Cervical back: Normal range of motion and neck supple.     Comments: Left elbow boggy olecranon bursa still swollen Skin:    General: Skin is warm and dry.  Neurological:     Mental Status: He is alert.     Comments: Alert Sl HOH Motor: LUE/LLE: 5/5 proximal distal RUE: 4 to 4+/5 proximal and distal with mild ataxia RLE: 4+/5 prox to distal   Assessment/Plan: 1. Functional deficits which require 3+ hours per day of interdisciplinary therapy in a comprehensive inpatient rehab setting.  Physiatrist is providing close team supervision and 24 hour management of active medical problems listed below.  Physiatrist and rehab team continue to assess barriers to discharge/monitor patient progress toward functional and medical goals  Care Tool:  Bathing     Body parts bathed by patient: Right arm,Left arm,Chest,Abdomen,Buttocks,Right upper leg,Left upper leg,Right lower leg,Left lower leg,Front perineal area,Face         Bathing assist Assist Level: Contact Guard/Touching assist     Upper Body Dressing/Undressing Upper body dressing   What is the patient wearing?: Pull over shirt    Upper body assist Assist Level: Supervision/Verbal cueing    Lower Body Dressing/Undressing Lower body dressing      What is the patient wearing?: Incontinence brief,Pants     Lower body assist Assist for lower body dressing: Minimal Assistance - Patient > 75%     Toileting Toileting    Toileting assist Assist for toileting: Moderate Assistance - Patient 50 - 74%     Transfers Chair/bed transfer  Transfers assist     Chair/bed transfer assist level: Minimal Assistance - Patient > 75%     Locomotion Ambulation   Ambulation assist      Assist level: Moderate Assistance - Patient 50 - 74% Assistive device: Walker-rolling Max distance: 88ft   Walk 10 feet activity   Assist  Walk 10 feet activity did not occur: Safety/medical concerns (fatigue, decreased balance,  R hemi, decreased motor planning/coordination)  Assist level: Contact Guard/Touching assist Assistive device: Walker-rolling   Walk 50 feet activity   Assist Walk 50 feet with 2 turns activity did not occur: Safety/medical concerns (fatigue, decreased balance, R hemi, decreased motor planning/coordination)  Assist level: Moderate Assistance - Patient - 50 - 74% Assistive device: Walker-rolling    Walk 150 feet activity   Assist Walk 150 feet activity did not occur: Safety/medical concerns (fatigue, decreased balance, R hemi, decreased motor planning/coordination)         Walk 10 feet on uneven surface  activity   Assist Walk 10 feet on uneven surfaces activity did not occur: Safety/medical concerns         Wheelchair     Assist Will patient  use wheelchair at discharge?: Yes Type of Wheelchair: Manual    Wheelchair assist level: Supervision/Verbal cueing Max wheelchair distance: 45ft    Wheelchair 50 feet with 2 turns activity    Assist        Assist Level: Supervision/Verbal cueing   Wheelchair 150 feet activity     Assist      Assist Level: Maximal Assistance - Patient 25 - 49%   Blood pressure 107/70, pulse 63, temperature 97.8 F (36.6 C), temperature source Oral, resp. rate 14, height 5\' 11"  (1.803 m), weight 83 kg, SpO2 90 %.  Medical Problem List and Plan: 1. 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               -patient may shower             -ELOS/Goals: 14 to 17 days/min a          -Continue CIR therapies including PT, OT   2.  Impaired mobility: -DVT/anticoagulation:  SCDs/TEDs--d/ced Lovenox due to low platelets. PC 83 on 6/6.              -antiplatelet therapy: Continue ASA daily.  3. Pain Management: Tylenol prn. Continue lidocaine patch for low back pain.  4. Mood: LCSW to follow for evaluation and support.              -antipsychotic agents: N/A  5. Neuropsych: This patient is capable of making decisions on his own behalf. 6. Skin/Wound Care: Routine pressure relief measures.  7. Fluids/Electrolytes/Nutrition: Monitor I/Os.            encourage PO, good appetite  6/6 I personally reviewed the patient's labs 8.  HTN: continue to monitor blood pressures TID--continue tenormin daily.            Vitals:   01/01/21 1932 01/02/21 0420  BP: (!) 92/59 107/70  Pulse: 62 63  Resp: 15 14  Temp: 97.8 F (36.6 C) 97.8 F (36.6 C)  SpO2: 96% 90%  soft BP, bradycardic will reduce atenolol to 12.5mg  daily  9.  Thrombocytopenia: 6/6 platelets remain in 80-90k range, no bleeding   -continue monitor serially 10.  Hyperglycemia: Hemoglobin A1c 5.5 and likely  stress related. 11.  COPD: Stable on Anoro 12.  Dyslipidemia:.             --on Lovaza and Zocor 13.  AKI on CKD?: Serum creatinine 1.5 at admission. Continue to monitor.               -encouraging fluids.   6/6 labs stable 14.  Left elbow cystic lesion: likely olecranon bursitis.  Outpatient f/u  with [facility] 15. Anxiety/depresion: Continues on home regimen of Prozac 40 mg, Xanax 0.5 mg qid and Triazolam 0.25 mg/hs for insomnia.   16. Constipation:  Colace with supper. Added prune juice with meals. Mag citrate after therapy on 6/5.   -continue senna-s at bedtime with miralax in the am 17. Tobacco abuse: continue nicotine patch 18. Bradycardia: continue to monitor TID      LOS: 7 days A FACE TO FACE EVALUATION WAS PERFORMED  Erick Colace 01/02/2021, 7:36 AM

## 2021-01-03 NOTE — Progress Notes (Signed)
PROGRESS NOTE   Subjective/Complaints: Denies pain Still feeling constipated. Changed Miralax order to be dissolved in prune juice Vitals stable No issues overnight  ROS: Patient denies CP, SOB, N/V/D, +constipation   Objective:   No results found. No results for input(s): WBC, HGB, HCT, PLT in the last 72 hours.  No results for input(s): NA, K, CL, CO2, GLUCOSE, BUN, CREATININE, CALCIUM in the last 72 hours.   Intake/Output Summary (Last 24 hours) at 01/03/2021 1342 Last data filed at 01/03/2021 1252 Gross per 24 hour  Intake 660 ml  Output 740 ml  Net -80 ml        Physical Exam: Vital Signs Blood pressure 104/66, pulse 66, temperature (!) 97.1 F (36.2 C), resp. rate 16, height 5\' 11"  (1.803 m), weight 83 kg, SpO2 93 %. Gen: no distress, normal appearing HEENT: oral mucosa pink and moist, NCAT Cardio: Reg rate Chest: normal effort, normal rate of breathing Abd: soft, non-distended Ext: no edema Psych: pleasant, normal affect  Musculoskeletal:     Cervical back: Normal range of motion and neck supple.     Comments: Left elbow boggy olecranon bursa still swollen Skin:    General: Skin is warm and dry.  Neurological:     Mental Status: He is alert.     Comments: Alert Sl HOH Motor: LUE/LLE: 5/5 proximal distal RUE: 4 to 4+/5 proximal and distal with mild ataxia RLE: 4+/5 prox to distal   Assessment/Plan: 1. Functional deficits which require 3+ hours per day of interdisciplinary therapy in a comprehensive inpatient rehab setting. Physiatrist is providing close team supervision and 24 hour management of active medical problems listed below. Physiatrist and rehab team continue to assess barriers to discharge/monitor patient progress toward functional and medical goals  Care Tool:  Bathing    Body parts bathed by patient: Right arm, Chest, Abdomen, Front perineal area, Face, Left arm, Right upper leg,  Left upper leg, Right lower leg, Left lower leg   Body parts bathed by helper: Buttocks     Bathing assist Assist Level: Supervision/Verbal cueing     Upper Body Dressing/Undressing Upper body dressing   What is the patient wearing?: Pull over shirt    Upper body assist Assist Level: Supervision/Verbal cueing    Lower Body Dressing/Undressing Lower body dressing      What is the patient wearing?: Incontinence brief, Pants     Lower body assist Assist for lower body dressing: Minimal Assistance - Patient > 75%     Toileting Toileting    Toileting assist Assist for toileting: Maximal Assistance - Patient 25 - 49% (min pericare, total A clothing)     Transfers Chair/bed transfer  Transfers assist     Chair/bed transfer assist level: Minimal Assistance - Patient > 75%     Locomotion Ambulation   Ambulation assist      Assist level: Minimal Assistance - Patient > 75% Assistive device: Walker-rolling Max distance: 28ft   Walk 10 feet activity   Assist  Walk 10 feet activity did not occur: Safety/medical concerns (fatigue, decreased balance, R hemi, decreased motor planning/coordination)  Assist level: Minimal Assistance - Patient > 75% Assistive device: Walker-rolling  Walk 50 feet activity   Assist Walk 50 feet with 2 turns activity did not occur: Safety/medical concerns (fatigue, decreased balance, R hemi, decreased motor planning/coordination)  Assist level: Moderate Assistance - Patient - 50 - 74% Assistive device: Walker-rolling    Walk 150 feet activity   Assist Walk 150 feet activity did not occur: Safety/medical concerns (fatigue, decreased balance, R hemi, decreased motor planning/coordination)         Walk 10 feet on uneven surface  activity   Assist Walk 10 feet on uneven surfaces activity did not occur: Safety/medical concerns         Wheelchair     Assist Will patient use wheelchair at discharge?: Yes Type of  Wheelchair: Manual    Wheelchair assist level: Supervision/Verbal cueing Max wheelchair distance: 80ft    Wheelchair 50 feet with 2 turns activity    Assist        Assist Level: Supervision/Verbal cueing   Wheelchair 150 feet activity     Assist      Assist Level: Maximal Assistance - Patient 25 - 49%   Blood pressure 104/66, pulse 66, temperature (!) 97.1 F (36.2 C), resp. rate 16, height 5\' 11"  (1.803 m), weight 83 kg, SpO2 93 %.  Medical Problem List and Plan: 1. 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               -patient may shower             -ELOS/Goals: 14 to 17 days/min a          -Continue CIR therapies including PT, OT   2.  Impaired mobility: -DVT/anticoagulation:  SCDs/TEDs--d/ced Lovenox due to low platelets. PC 83 on 6/6.              -antiplatelet therapy: Continue ASA daily.  3. Pain Management: Tylenol prn. Continue lidocaine patch for low back pain. Add vitamin D level to Monday' labs 4. Mood: LCSW to follow for evaluation and support.              -antipsychotic agents: N/A  5. Neuropsych: This patient is capable of making decisions on his own behalf. 6. Skin/Wound Care: Routine pressure relief measures.  7. Fluids/Electrolytes/Nutrition: Monitor I/Os.            encourage PO, good appetite  6/6 I personally reviewed the patient's labs 8.  HTN: continue to monitor blood pressures TID--continue tenormin daily.            Vitals:   01/03/21 0806 01/03/21 1313  BP:  104/66  Pulse:  66  Resp:  16  Temp:  (!) 97.1 F (36.2 C)  SpO2: 93% 93%  soft BP, bradycardic will reduce atenolol to 12.5mg  daily, monitor for effects of yesterday's change  9.  Thrombocytopenia: 6/6 platelets remain in 80-90k range, no bleeding   -continue monitor serially 10.  Hyperglycemia: Hemoglobin A1c 5.5 and likely stress  related. 11.  COPD: Stable on Anoro 12.  Dyslipidemia:.             --on Lovaza and Zocor 13.  AKI on CKD?: Serum creatinine 1.5 at admission. Continue to monitor.               -encouraging fluids.   6/6 labs stable 14.  Left elbow cystic lesion: likely olecranon bursitis.  Outpatient f/u with [facility] 15. Anxiety/depresion: Continues on home  regimen of Prozac 40 mg, Xanax 0.5 mg qid and Triazolam 0.25 mg/hs for insomnia.   16. Constipation:  Colace with supper. Added prune juice with meals. Mag citrate after therapy on 6/5.   -continue senna-s at bedtime with miralax in the am 17. Tobacco abuse: continue nicotine patch 18. Bradycardia: continue to monitor TID      LOS: 8 days A FACE TO FACE EVALUATION WAS PERFORMED  Clint Bolder P Pricilla Moehle 01/03/2021, 1:42 PM

## 2021-01-03 NOTE — Progress Notes (Signed)
Occupational Therapy Session Note  Patient Details  Name: Juan Brewer MRN: [id number] Date of Birth: 03-16-1946  Today's Date: 01/03/2021 OT Individual Time: 2767-0110 OT Individual Time Calculation (min): 40 min    Short Term Goals: Week 1:  OT Short Term Goal 1 (Week 1): Pt will demonstrate improved RUE coordination to complete eating independently, opening containers himself. OT Short Term Goal 2 (Week 1): Pt will maintain sitting balance EOB with no lateral lean while donning clothing with no cues. OT Short Term Goal 3 (Week 1): Pt will don underwear and pants with CGA for balance only. OT Short Term Goal 4 (Week 1): Pt will be able to don and tie shoes independently.  Skilled Therapeutic Interventions/Progress Updates:    Pt received seated in recliner with LPN present, denies pain, agreeable to therapy. Session focus on self-care retraining. Squat-pivot to his R with mod A + mod Vcs for technique from recliner > w/c. Partial stand-pivot to toilet with use of front grab bar, total A in standing for LB clothing management. Cont void of b/b. Able to complete seated pericare via lateral leans with min A for thoroughness. Pt impulsive during toileting, standing when instructed to wait for therapist. Squat-pivot to and from TTB with use of front grab bar and min A to pivot hips. Pt opting to rinse off quickly, completing UB/LBB with close S. Doffed/donned shirt close S with min Vcs for hemi-technique. Doffed brief/shorts with min A to remove from ankles 2/2 limited space in shower, able to redon underwear/shorts with CGA for STS. Total A to redon teds. Completed seated oral care with close S. Stand-pivot with use of bed rail back to bed with CGA and returned to supine with close S. Pt benefits best from clear, direct cuing for transfers/func mobility 2/2 poor safety awareness. Pt stating that he could walk back to bed with no AD.  Pt left semi-reclined in bed with bed alarm engaged, call bell in  reach, and all immediate needs met.    Therapy Documentation Precautions:  Precautions Precautions: Fall Precaution Comments: R hemi, mild R inattention Restrictions Weight Bearing Restrictions: No   Pain: denies   ADL: See Care Tool for more details.   Therapy/Group: Individual Therapy  Volanda Napoleon MS, OTR/L   01/03/2021, 6:46 AM

## 2021-01-03 NOTE — Progress Notes (Signed)
Occupational Therapy Session Note  Patient Details  Name: Juan Brewer MRN: [id number] Date of Birth: Apr 06, 1946  Today's Date: 01/03/2021 OT Individual Time: 9150-4136 OT Individual Time Calculation (min): 57 min    Short Term Goals: Week 1:  OT Short Term Goal 1 (Week 1): Pt will demonstrate improved RUE coordination to complete eating independently, opening containers himself. OT Short Term Goal 2 (Week 1): Pt will maintain sitting balance EOB with no lateral lean while donning clothing with no cues. OT Short Term Goal 3 (Week 1): Pt will don underwear and pants with CGA for balance only. OT Short Term Goal 4 (Week 1): Pt will be able to don and tie shoes independently.  Skilled Therapeutic Interventions/Progress Updates:    Pt received in bed and consented to OT tx. Session focused on standing tolerance, balance, FMC and RUE coordination and strength for increase ADL independence and safety. Pt instructed to don socks while sitting EOB, was able to don L sock, but required assist and lost his balance when sitting EOB trying to put on R sock, requiring therapist min A to sit back upright. Once pt was brought down to therapy gym, he seemed to complain the entire session, rolled his eyes every time he was given instructions for new activity. During standing activity, pt instructed in [facility] tasks with large pegs/pegboard and with small tacks and cork board with close SUP, req cuing to reach back for w/c during seated rest breaks. Pt then instructed in BUE strengthening HEP with 3#db, and was unable to count out loud properly and skip numbers when counting his reps. When therapist counted along and corrected him on numbers that he missed, he said "You need to take that memory course they've got here." Pt given paper with numbers jumbled from 1-20, instructed to mark each of them off in order, which he was able to do with 100% accuracy. When instructed in clothespin activity to increase R hand  coordination and strength, pt rolls eyes and makes sarcastic remarks asking "how many repeat customers do you get?" "You have been such a pleasure to work with." Uncertain what caused pt to be so agitated during session. Pt was able to complete clothespin activity with increased time. After tx, pt helped back to room and left u pin w/c with seatbelt alarm on, call light in reach, and all needs met.   Therapy Documentation Precautions:  Precautions Precautions: Fall Precaution Comments: R hemi, mild R inattention Restrictions Weight Bearing Restrictions: No  Vital Signs: Therapy Vitals Temp: (!) 97.1 F (36.2 C) Pulse Rate: 66 Resp: 16 BP: 104/66 Patient Position (if appropriate): Lying Oxygen Therapy SpO2: 93 % O2 Device: Room Air Pain: Pain Assessment Pain Scale: Faces Pain Score: 0-No pain   Therapy/Group: Individual Therapy  Laressa Bolinger 01/03/2021, 5:12 PM

## 2021-01-03 NOTE — Plan of Care (Signed)
  Problem: RH Ambulation Goal: LTG Patient will ambulate in controlled environment (PT) Description: LTG: Patient will ambulate in a controlled environment, # of feet with assistance (PT). Flowsheets (Taken 01/03/2021 0743) LTG: Pt will ambulate in controlled environ  assist needed:: (upgraded due to improved endurance and balance) Contact Guard/Touching assist LTG: Ambulation distance in controlled environment: 38ft with LRAD Goal: LTG Patient will ambulate in home environment (PT) Description: LTG: Patient will ambulate in home environment, # of feet with assistance (PT). Flowsheets (Taken 01/03/2021 0743) LTG: Pt will ambulate in home environ  assist needed:: (upgraded due to improved endurance and balance) Contact Guard/Touching assist Note: upgraded due to improved endurance and balance

## 2021-01-03 NOTE — Progress Notes (Signed)
Physical Therapy Weekly Progress Note  Patient Details  Name: Juan Brewer MRN: [id number] Date of Birth: [birth year]  Beginning of progress report period: December 27, 2020 End of progress report period: January 03, 2021  Today's Date: 01/03/2021 PT Individual Time: 0915-1011 PT Individual Time Calculation (min): 56 min   Patient has met 1 of 3 short term goals. Pt demonstrates slow progress towards long term goals. Pt currently requires supervision for bed mobility (with mod cues for sequencing), min/mod A for sit<>stand and stand<>pivot transfers with RW depending on fatigue, min A to navigate 4 steps with 2 rails (with max cues for sequencing), and min/mod A to ambulate 157f with RW with sudden buckling of bilateral knees and LOB with fatigue (with little awareness). Pt continues to be limited by poor insight into deficits, poor safety awareness, poor motor planning/sequencing, decreased balance/postural control, R hemiparesis, and generalized weakness.   Patient continues to demonstrate the following deficits muscle weakness, decreased cardiorespiratoy endurance, impaired timing and sequencing, abnormal tone, unbalanced muscle activation, decreased coordination, and decreased motor planning, decreased motor planning, decreased attention, decreased awareness, decreased problem solving, and decreased safety awareness, and decreased sitting balance, decreased standing balance, decreased postural control, hemiplegia, and decreased balance strategies and therefore will continue to benefit from skilled PT intervention to increase functional independence with mobility.  Patient progressing toward long term goals..  Continue plan of care.  PT Short Term Goals Week 1:  PT Short Term Goal 1 (Week 1): pt will transfer sit<>stand with LRAD and CGA PT Short Term Goal 1 - Progress (Week 1): Progressing toward goal PT Short Term Goal 2 (Week 1): pt will transfer bed<>chair with LRAD and CGA PT Short Term Goal 2  - Progress (Week 1): Progressing toward goal PT Short Term Goal 3 (Week 1): pt will ambulate 1105fwith LRAD and max of 1 PT Short Term Goal 3 - Progress (Week 1): Met Week 2:  PT Short Term Goal 1 (Week 2): pt will transfer sit<>stand with LRAD and CGA PT Short Term Goal 2 (Week 2): pt will transfer bed<>chair with LRAD and CGA PT Short Term Goal 3 (Week 2): pt will perform simulated car transfer with LRAD and min A  Skilled Therapeutic Interventions/Progress Updates:  Ambulation/gait training;Discharge planning;Functional mobility training;Psychosocial support;Therapeutic Activities;Balance/vestibular training;Disease management/prevention;Neuromuscular re-education;Skin care/wound management;Therapeutic Exercise;Wheelchair propulsion/positioning;Cognitive remediation/compensation;DME/adaptive equipment instruction;Pain management;Splinting/orthotics;UE/LE Strength taining/ROM;Community reintegration;Functional electrical stimulation;Patient/family education;Stair training;UE/LE Coordination activities   Today's Interventions: Received pt supine in bed asleep, upon wakening, pt agreeable to PT treatment, and denied any pain during session. Session with emphasis on functional mobility/transfers, toileting, generalized strengthening, dynamic standing balance/coordination, gait training, NMR, and improved activity tolerance. MD present for morning rounds and donned ted hose in supine with total A. Pt transferred supine<>sitting EOB with HOB elevated and supervision and donned shoes sitting EOB with max A for time management purposes. Pt required 2 attempts and mod A to stand from EOB initially due to posterior lean and difficulty achieving upright posture. Cues for anterior weight shifting and hand placement on bed and on RW. Pt ambulated 1078fo bathroom with RW and min fading to mod A and required heavy mod/max A when turning to sit on commode and max cues for sequencing as pt with bilateral knees flexed;  almost buckling. Pt able to void and doffed dirty brief and donned clean one with max A. Sit<>stand with min A and heavy posterior lean. Stand<>pivot bedside commode<>WC with RW and min A. [facility]  and washed face with supervision. Pt transported to ortho gym in [facility] total A for time management purposes and worked on [place] on [place] at level 3 for 3 minutes forwards and 3 minutes backwards. Pt transferred sit<>stand with RW and CGA and ambulated 21f with RW and min A. However, [facility] to sit too early and abandoning RW requring heavy max A to pivot hips into WC. Educated pt on safety with turning and demonstrated correct technique, however pt unreceptive to education stating "whatever" and deflecting from current deficits when brought to his attention. Pt performed the following seated activities on BITS with emphasis on visual scanning, attention, and functional use of RUE: -maze using RUE with multiple rest breaks. Mild dysmetria and difficulty with fine motor control. -visual scanning single target for 2 minutes with 88% accurracy [facility] and [facility]<>recliner stand<>pivot without AD and min A. Concluded session with pt sitting in recliner, needs within reach, and seatbelt alarm on.   Therapy Documentation Precautions:  Precautions Precautions: Fall Precaution Comments: R hemi, mild R inattention Restrictions Weight Bearing Restrictions: No  Therapy/Group: Individual Therapy AAlfonse AlpersPT, DPT   01/03/2021, 7:27 AM

## 2021-01-03 NOTE — Progress Notes (Signed)
Speech Language Pathology Daily Session Note  Patient Details  Name: Juan Brewer MRN: [id number] Date of Birth: Oct 29, 1945  Today's Date: 01/03/2021 SLP Individual Time: 1300-1345 SLP Individual Time Calculation (min): 45 min  Short Term Goals: Week 1: SLP Short Term Goal 1 (Week 1): Patient will complete medication management simulation with minA cues for accuracy. SLP Short Term Goal 2 (Week 1): Patient will complete mild complex level money/financial management tasks with minA cues. SLP Short Term Goal 3 (Week 1): Patient will perform alternating attention functional tasks with modA cues for attention to and attempts at correction of errors. SLP Short Term Goal 4 (Week 1): Patient will recall specific therapeutic interventions on a daily basis with supervisionA. SLP Short Term Goal 5 (Week 1): Patient will demonstrate anticipatory awareness for impact of his deficits on function, with minA cues.  Skilled Therapeutic Interventions: Skilled SLP intervention focused on cognition. Pt required education on purpose of speech therapy and goals. Pt thought goals were to address his speech and stated "I talk just fine". ALFA counting money subtest completed with Mod A verbal and visual cues. Pt demonstrated decreased awareness of errors and needed visual cues and direction from slp to total the amount of coins and bills accurately. Pt able to recall 3 tasks completed in PT and OT sessions and required mod A to verbalize his physical/cognitive changes since recent hospitalization. Pt left lying in bed with bed alarm set and call button within reach. Cont with therapy per plan of care.         Pain Pain Assessment Pain Scale: Faces Pain Score: 0-No pain  Therapy/Group: Individual Therapy  Carlean Jews Cashay Manganelli 01/03/2021, 3:04 PM

## 2021-01-03 NOTE — Progress Notes (Signed)
Physical Therapy Session Note  Patient Details  Name: Juan Brewer MRN: [id number] Date of Birth: 06/21/1946  Today's Date: 01/03/2021 PT Individual Time: 5732-2025 PT Individual Time Calculation (min): 30 min   Short Term Goals: Week 2:  PT Short Term Goal 1 (Week 2): pt will transfer sit<>stand with LRAD and CGA PT Short Term Goal 2 (Week 2): pt will transfer bed<>chair with LRAD and CGA PT Short Term Goal 3 (Week 2): pt will perform simulated car transfer with LRAD and min A  Skilled Therapeutic Interventions/Progress Updates:    Patient in w/c in room.  Performed sit to stand to RW with S using both hands on walker.  Educated needing to push up from sitting surface.  Ambulated x 120' with RW and min A mod cues to keep walker close, for tall posture and R knee extension in stance.  Patient standing with back to wall and ball at R knee cues to keep ball against wall and performing L step forward and back and side step and for static standing.  Patient ambulated to therapy gym with RW another 59' with CGA and cues.  Adjusted RW to height due to too tall.  Demonstrated for pt keeping walker close and staying tall on R LE.  Patient ambulated 120' with RW and CGA with improved upright posture and proximity to RW.  Patient encouraged to focus on 3 things: tall posture, walker proximity and R knee extension in stance. Ambulated to room with one seated rest x 160' with cues given again when distracted talking to nursing staff in nursing station.  Patient assisted to supine with S and left with call bell in reach and bed alarm active.  Therapy Documentation Precautions:  Precautions Precautions: Fall Precaution Comments: R hemi, mild R inattention Restrictions Weight Bearing Restrictions: No  Pain: Pain Assessment Pain Scale: Faces Pain Score: 0-No pain   Therapy/Group: Individual Therapy  [name] [place], PT 01/03/2021, 4:20 PM

## 2021-01-04 DIAGNOSIS — K5901 Slow transit constipation: Secondary | ICD-10-CM

## 2021-01-04 MED ORDER — SORBITOL 70 % SOLN
60.0000 mL | Freq: Once | Status: AC
  Administered 2021-01-04: 60 mL via ORAL
  Filled 2021-01-04: qty 60

## 2021-01-04 MED ORDER — DOCUSATE SODIUM 100 MG PO CAPS
100.0000 mg | ORAL_CAPSULE | Freq: Every day | ORAL | Status: DC
  Administered 2021-01-04 – 2021-01-12 (×9): 100 mg via ORAL
  Filled 2021-01-04 (×9): qty 1

## 2021-01-04 NOTE — Progress Notes (Signed)
Occupational Therapy Weekly Progress Note  Patient Details  Name: Juan Brewer MRN: [id number] Date of Birth: [birth year]  Beginning of progress report period: December 27, 2020 End of progress report period: January 04, 2021  Today's Date: 01/04/2021 OT Individual Time: 7902-4097 OT Individual Time Calculation (min): 51 min    Patient has met 2 of 4 short term goals.  Patient is making progress towards goals. Patient is able to correct posture when cued while laterally leaning.  Patient requires min assist for donning underwear due to assistance needed to secure incontinence brief. Patient currently requires min assist for sit to stand transfers from all surfaces. Patient fluctuates in assist needed for bed mobility ranging from Min A to [place]. Patient demonstrates decreased safety awareness when transferring and frequently requires cues to ensure safety.   Patient continues to demonstrate the following deficits: impaired timing and sequencing, decreased coordination, and decreased motor planning and decreased sitting balance, decreased standing balance, decreased postural control, and decreased balance strategies and therefore will continue to benefit from skilled OT intervention to enhance overall performance with BADL, iADL, and Reduce care partner burden.  Patient progressing toward long term goals..  Continue plan of care.  OT Short Term Goals Week 1:  OT Short Term Goal 1 (Week 1): Pt will demonstrate improved RUE coordination to complete eating independently, opening containers himself. OT Short Term Goal 1 - Progress (Week 1): Met OT Short Term Goal 2 (Week 1): Pt will maintain sitting balance EOB with no lateral lean while donning clothing with no cues. OT Short Term Goal 2 - Progress (Week 1): Progressing toward goal OT Short Term Goal 3 (Week 1): Pt will don underwear and pants with CGA for balance only. OT Short Term Goal 3 - Progress (Week 1): Progressing toward goal OT Short Term Goal  4 (Week 1): Pt will be able to don and tie shoes independently. OT Short Term Goal 4 - Progress (Week 1): Met  Week 2:  OT Short Term Goal 1 (Week 2): STG = LTG due to LOS  Skilled Therapeutic Interventions/Progress Updates:   Met pt lying supine in bed slightly reclined, pt agreed to session. Treatment session was focused on improving postural control and RUE coordination during activities. Pt was supervision for lying > sitting EOB primarily using bed rails to bring UB upright. Pt did not maintain static balance while sitting EOB, but was able to self-correct posture. Pt was Min A for sit > stand transfer to RW. Pt was able to ambulate short distance but knees frequently buckled. Pt leaned on OTS once fatigued while ambulating. Pt was brought to main gym and engaged in [place] coordination activity. Pt accomplished activity but required cues for using RUE. OTS modeled quadruped to improve postural control. Pt was not able to accomplish position due to poor motor planning and weak RUE. Pt was able to log roll on mat 5 times, OTS provided cues for sequencing. Pt sat on the edge of the mat and tolerated dynamic sitting during wash cloth throwing activity. OTS modeled side leaning to improve postural control, pt was not able to motor plan flexing elbows for side leaning. Pt was brought back to room and ambulated from door to bed using RW with CGA. Pt was Min A for sitting > lying supine, OTS provided assistance with aligning LB. Left pt lying supine in bed, bed alarm on, all needs met.   Therapy Documentation Precautions:  Precautions Precautions: Fall Precaution Comments: R hemi, mild R inattention  Restrictions Weight Bearing Restrictions: No   Vital Signs: Therapy Vitals Temp: 97.6 F (36.4 C) Temp Source: Oral Pulse Rate: 63 BP: 112/80 Oxygen Therapy SpO2: 96 % Pain: Pain Assessment Pain Scale: 0-10 Pain Score: 0-No pain Faces Pain Scale: No hurt ADL: ADL Eating: Set up Grooming:  Setup Upper Body Bathing: Supervision/safety Where Assessed-Upper Body Bathing: Shower Lower Body Bathing: Contact guard Where Assessed-Lower Body Bathing: Shower Upper Body Dressing: Supervision/safety Lower Body Dressing: Minimal assistance Where Assessed-Lower Body Dressing: Wheelchair Toileting: Minimal assistance Where Assessed-Toileting: Glass blower/designer: Psychiatric nurse Method: Engineer, water: Grab bars, Raised toilet seat Social research officer, government: Environmental education officer Method: Education officer, environmental: Radio broadcast assistant, Grab bars     Therapy/Group: Individual Therapy  [name] 01/04/2021, 2:23 PM

## 2021-01-04 NOTE — Progress Notes (Signed)
Physical Therapy Session Note  Patient Details  Name: Juan Brewer MRN: [id number] Date of Birth: 05/12/1946  Today's Date: 01/04/2021 PT Individual Time: 0800-0854 PT Individual Time Calculation (min): 54 min   Short Term Goals: Week 1:  PT Short Term Goal 1 (Week 1): pt will transfer sit<>stand with LRAD and CGA PT Short Term Goal 1 - Progress (Week 1): Progressing toward goal PT Short Term Goal 2 (Week 1): pt will transfer bed<>chair with LRAD and CGA PT Short Term Goal 2 - Progress (Week 1): Progressing toward goal PT Short Term Goal 3 (Week 1): pt will ambulate 78f with LRAD and max of 1 PT Short Term Goal 3 - Progress (Week 1): Met Week 2:  PT Short Term Goal 1 (Week 2): pt will transfer sit<>stand with LRAD and CGA PT Short Term Goal 2 (Week 2): pt will transfer bed<>chair with LRAD and CGA PT Short Term Goal 3 (Week 2): pt will perform simulated car transfer with LRAD and min A  Skilled Therapeutic Interventions/Progress Updates:   Received pt supine in bed, pt agreeable to PT treatment, and denied any pain during session. Assisted pt with opening birthday cards per pt request prior to getting OOB. Session with emphasis on functional mobility/transfers, generalized strengthening, dynamic standing balance/coordination, gait training, and improved activity tolerance. Pt transferred supine<>sitting EOB with supervision and increased time and donned shoes with max A for time management purposes. Stand<>pivot bed<>WC without AD and mod A as pt sitting on armrest and requiring assist to pivot hips completly over into seat. [facility] assist then [facility] for time management purposes. Sit<>stand with RW and CGA and ambulated 219fx 2 trials with RW and min A. Pt demonstrated improvements in upright posture and knee extension but continues to require cues for proximity to RW and sequencing/safety when turning. Pt performed TUG  with RW and min A with cues for proximity to RW and sequencing when turning with average of 39 seconds: -Trial 1: 46 seconds -Trial 2: 36 seconds -Trail 3: 36 seconds  Pt educated on test results and significance indicating high fall risk as well as importance of using RW upon discharge; pt ultimately verbalized understanding and in agreement. Discussed equipment for discharge with recommendation to use RW 24/7. Took measurements to fit pt for 18x18 WC. Pt also reported that HHPT would be more convenient than OPPT; will notify CSW. Educated pt on [place] management including locking/unlocking brakes, removing armrests, and donning/doffing legrests (need to continue practicing as pt requires min/mod A). [facility] and transferred WC<>recliner stand<>pivot without AD and min A. Concluded session with pt sitting in recliner, needs within reach, and seatbelt alarm on. Provided pt with fresh ice water.   Therapy Documentation Precautions:  Precautions Precautions: Fall Precaution Comments: R hemi, mild R inattention Restrictions Weight Bearing Restrictions: No   Therapy/Group: Individual Therapy AnAlfonse AlpersT, DPT   01/04/2021, 7:30 AM

## 2021-01-04 NOTE — Progress Notes (Signed)
PROGRESS NOTE   Subjective/Complaints:  Pt denies pain- LBM at least 3 days ago- doesn't feel like can go, and has  a little " constipation"-  Voiding well-    ROS:  Pt denies SOB, abd pain, CP, N/V, and vision changes   Objective:   No results found. No results for input(s): WBC, HGB, HCT, PLT in the last 72 hours.  No results for input(s): NA, K, CL, CO2, GLUCOSE, BUN, CREATININE, CALCIUM in the last 72 hours.   Intake/Output Summary (Last 24 hours) at 01/04/2021 0925 Last data filed at 01/03/2021 2048 Gross per 24 hour  Intake 600 ml  Output 400 ml  Net 200 ml        Physical Exam: Vital Signs Blood pressure 96/76, pulse 66, temperature 98.1 F (36.7 C), resp. rate 18, height 5\' 11"  (1.803 m), weight 83 kg, SpO2 91 %.    General: awake, alert, appropriate, sitting up in bed; sing song voice; NAD HENT: conjugate gaze; oropharynx moist CV: regular rate; no JVD Pulmonary: CTA B/L; no W/R/R- good air movement GI: soft, NT: but slightly distended; hypoactive BS Psychiatric: appropriate; interactive Neurological: alert; HOH Musculoskeletal:     Cervical back: Normal range of motion and neck supple.     Comments: Left elbow boggy olecranon bursa still swollen Skin:    General: Skin is warm and dry.  Neurological:     Mental Status: He is alert.     Comments: Alert Sl HOH Motor: LUE/LLE: 5/5 proximal distal RUE: 4 to 4+/5 proximal and distal with mild ataxia RLE: 4+/5 prox to distal   Assessment/Plan: 1. Functional deficits which require 3+ hours per day of interdisciplinary therapy in a comprehensive inpatient rehab setting. Physiatrist is providing close team supervision and 24 hour management of active medical problems listed below. Physiatrist and rehab team continue to assess barriers to discharge/monitor patient progress toward functional and medical goals  Care Tool:  Bathing    Body parts  bathed by patient: Right arm, Chest, Abdomen, Front perineal area, Face, Left arm, Right upper leg, Left upper leg, Right lower leg, Left lower leg   Body parts bathed by helper: Buttocks     Bathing assist Assist Level: Supervision/Verbal cueing     Upper Body Dressing/Undressing Upper body dressing   What is the patient wearing?: Pull over shirt    Upper body assist Assist Level: Supervision/Verbal cueing    Lower Body Dressing/Undressing Lower body dressing      What is the patient wearing?: Incontinence brief, Pants     Lower body assist Assist for lower body dressing: Minimal Assistance - Patient > 75%     Toileting Toileting    Toileting assist Assist for toileting: Maximal Assistance - Patient 25 - 49% (min pericare, total A clothing)     Transfers Chair/bed transfer  Transfers assist     Chair/bed transfer assist level: Minimal Assistance - Patient > 75%     Locomotion Ambulation   Ambulation assist      Assist level: Minimal Assistance - Patient > 75% Assistive device: Walker-rolling Max distance: 26ft   Walk 10 feet activity   Assist  Walk 10 feet activity did  not occur: Safety/medical concerns (fatigue, decreased balance, R hemi, decreased motor planning/coordination)  Assist level: Minimal Assistance - Patient > 75% Assistive device: Walker-rolling   Walk 50 feet activity   Assist Walk 50 feet with 2 turns activity did not occur: Safety/medical concerns (fatigue, decreased balance, R hemi, decreased motor planning/coordination)  Assist level: Moderate Assistance - Patient - 50 - 74% Assistive device: Walker-rolling    Walk 150 feet activity   Assist Walk 150 feet activity did not occur: Safety/medical concerns (fatigue, decreased balance, R hemi, decreased motor planning/coordination)         Walk 10 feet on uneven surface  activity   Assist Walk 10 feet on uneven surfaces activity did not occur: Safety/medical  concerns         Wheelchair     Assist Will patient use wheelchair at discharge?: Yes Type of Wheelchair: Manual    Wheelchair assist level: Supervision/Verbal cueing Max wheelchair distance: 30ft    Wheelchair 50 feet with 2 turns activity    Assist        Assist Level: Supervision/Verbal cueing   Wheelchair 150 feet activity     Assist      Assist Level: Maximal Assistance - Patient 25 - 49%   Blood pressure 96/76, pulse 66, temperature 98.1 F (36.7 C), resp. rate 18, height 5\' 11"  (1.803 m), weight 83 kg, SpO2 91 %.  Medical Problem List and Plan: 1. 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               -patient may shower             -ELOS/Goals: 14 to 17 days/min a          con't PT and OT/CIR 2.  Impaired mobility: -DVT/anticoagulation:  SCDs/TEDs--d/ced Lovenox due to low platelets. PC 83 on 6/6.              -antiplatelet therapy: Continue ASA daily.  3. Pain Management: Tylenol prn. Continue lidocaine patch for low back pain. Add vitamin D level to Monday' labs  6/10- denies pain- con't current regimen 4. Mood: LCSW to follow for evaluation and support.              -antipsychotic agents: N/A  5. Neuropsych: This patient is capable of making decisions on his own behalf. 6. Skin/Wound Care: Routine pressure relief measures.  7. Fluids/Electrolytes/Nutrition: Monitor I/Os.            encourage PO, good appetite  6/6 I personally reviewed the patient's labs 8.  HTN: continue to monitor blood pressures TID--continue tenormin daily.            Vitals:   01/03/21 1921 01/04/21 0640  BP: 112/72 96/76  Pulse: 66 66  Resp: 14 18  Temp: 97.8 F (36.6 C) 98.1 F (36.7 C)  SpO2: 94% 91%  soft BP, bradycardic will reduce atenolol to 12.5mg  daily, monitor for effects of yesterday's change  6/10- Pt's pulse is in 60s  and BP is still soft, first dose at lower atenolol is this AM.  9.  Thrombocytopenia: 6/6 platelets remain in 80-90k range, no bleeding   -continue monitor serially 10.  Hyperglycemia: Hemoglobin A1c 5.5 and likely stress related. 11.  COPD: Stable on Anoro 12.  Dyslipidemia:.             --on Lovaza and Zocor 13.  AKI on CKD?:  Serum creatinine 1.5 at admission. Continue to monitor.               -encouraging fluids.   6/6 labs stable 14.  Left elbow cystic lesion: likely olecranon bursitis.  Outpatient f/u with [facility] 15. Anxiety/depresion: Continues on home regimen of Prozac 40 mg, Xanax 0.5 mg qid and Triazolam 0.25 mg/hs for insomnia.   16. Constipation:  Colace with supper. Added prune juice with meals. Mag citrate after therapy on 6/5.   -continue senna-s at bedtime with miralax in the am  6/10- added Colace and ordered Sorbitol 60cc if no BM by 5pm.  17. Tobacco abuse: continue nicotine patch 18. Bradycardia: continue to monitor TID  6/10- better today- 66 in last 24 hours- will monitor      LOS: 9 days A FACE TO FACE EVALUATION WAS PERFORMED  Juan Brewer 01/04/2021, 9:25 AM

## 2021-01-04 NOTE — Progress Notes (Signed)
Physical Therapy Session Note  Patient Details  Name: Juan Brewer MRN: [id number] Date of Birth: Mar 24, 1946  Today's Date: 01/04/2021 PT Individual Time: 1417-1530 PT Individual Time Calculation (min): 73 min   Short Term Goals: Week 2:  PT Short Term Goal 1 (Week 2): pt will transfer sit<>stand with LRAD and CGA PT Short Term Goal 2 (Week 2): pt will transfer bed<>chair with LRAD and CGA PT Short Term Goal 3 (Week 2): pt will perform simulated car transfer with LRAD and min A  Skilled Therapeutic Interventions/Progress Updates:  Patient supine in bed on phone on entrance to room. Patient alert and agreeable to PT session. When asked if pt would like to participate in therapy session outside, he asks if he will be able to smoke outside. When told that the entire campus is non-smoking, pt states, "then there's no need to go outside." Patient denied pain during session.  Therapeutic Activity: Bed Mobility: Patient performed supine <> sit requiring extra time to reach seated position from supine, but easily brings BLE to bed surface on return to supine. Uses bedrail for UE support. At end of session is able to use BUE and BLE to position toward [facility]. VC/ tc required for technique. Transfers: Patient performed STS and SPVT transfers throughout session with CGA and vc required for increasing forward lean, pushing from armrests. Has tendency to discard walker prior to full pivot to seat and vc required for reaching back to find armrests prior to descending to sit.  Gait Training:  Patient ambulated 110' x1 using RW with CGA/ Min A for balance. Demonstrated mild LOB toward R side with crossover stepping/ NBOS during turn to R side. Provided vc/ tc for maintaining close proximity to walker, upright posture, bil knee extension during stance phase.  Neuromuscular Re-ed: NMR facilitated during session with focus on motor control/ muscle facilitation and standing balance. First pt guided in continuous  reciprocation of BUE and BLE using NuStep L3 x , then L4 x45min, then L3 x67min with focus on maintaining RLE alignment from knee to 2nd toe. Good quality of movement until small breakdown with slight hip ER and knee tracking more laterally for final 30-40sec. Pt then guided in seated and standing balance challenge with beanbags thrown to target. Good dynamic sitting balance noted and standing balance performed with dynamic stance using RUE to throw and requiring Min/ Mod A to maintain balance. Pt also guided in static standing balance on low foam wedge using HHA bilaterally with difficulty finding balance with need to lean forward. Improved abilities on Airex with pt able to withstand minimal perturbations from therapist with BUE support on walker, but requiring consistent Min A to maintain balance against minimal perturbations with no UE support. NMR performed for improvements in motor control and coordination, balance, sequencing, judgement, and self confidence/ efficacy in performing all aspects of mobility at highest level of independence.   Patient supine in bed at end of session with brakes locked, bed alarm set, and all needs within reach.     Therapy Documentation Precautions:  Precautions Precautions: Fall Precaution Comments: R hemi, mild R inattention Restrictions Weight Bearing Restrictions: No  Therapy/Group: Individual Therapy  Loel Dubonnet PT, DPT 01/04/2021, 4:25 PM

## 2021-01-04 NOTE — Progress Notes (Signed)
Occupational Therapy Session Note  Patient Details  Name: Juan Brewer MRN: [id number] Date of Birth: 1946/03/25  Today's Date: 01/04/2021 OT Individual Time: 1022-1100 OT Individual Time Calculation (min): 38 min    Short Term Goals: Week 1:  OT Short Term Goal 1 (Week 1): Pt will demonstrate improved RUE coordination to complete eating independently, opening containers himself. OT Short Term Goal 2 (Week 1): Pt will maintain sitting balance EOB with no lateral lean while donning clothing with no cues. OT Short Term Goal 3 (Week 1): Pt will don underwear and pants with CGA for balance only. OT Short Term Goal 4 (Week 1): Pt will be able to don and tie shoes independently.  Skilled Therapeutic Interventions/Progress Updates:    Pt received semi-reclined in bed, denies pain, agreeable to therapy. Session focus on self-care retraining. Came to sitting EOB with distant S + use of bed rail. Stand-pivot throughout session with CGA + RW. Stand-pivot from w/c to toilet with use of R grab bar with CGA, additionally CGA for LB clothing management + min A for thoroughness of pericare. Cont void of bowel. Pt attempting to stand despite previously being instructed to wait for therapist. Squat-pivot to and from TTB from w/c with CGA + heavy Vcs for technique qith use of grab bars. Pt bathed full-body with min A to bathe buttocks thoroughly 2/2 limited space to standing in shower. S to doff/don shirt, CGA to doff/don underwear/shorts. Total A to don/doff teds at bed level. Returned to supine with close S and min A to boost up in bed. Pt with poor safety awareness regarding environment, insisting on getting dressed in tight space despite prompting to first move w/c.  Pt left semi-reclined in bed with bed alarm engaged, call bell in reach, and all immediate needs met.    Therapy Documentation Precautions:  Precautions Precautions: Fall Precaution Comments: R hemi, mild R inattention Restrictions Weight  Bearing Restrictions: No  Pain: denies   ADL: See Care Tool for more details.   Therapy/Group: Individual Therapy  Volanda Napoleon MS, OTR/L   01/04/2021, 6:42 AM

## 2021-01-04 NOTE — Progress Notes (Signed)
Patient ID: Juan Brewer, male   DOB: Nov 27, 1945, 75 y.o.   MRN: [id number]  Spoke with wife yesterday to discuss team conference goals of min-supervision and target discharge date of 6/19. Have scheduled family education for Wed 6/15 from 9-12. Pt has made good progress this week and is hopeful goals will be upgraded. Wife reports their daughter in-law will be with him during the ay while she and son work. Work on discharge needs.

## 2021-01-05 NOTE — Progress Notes (Signed)
Occupational Therapy Session Note  Patient Details  Name: Juan Brewer MRN: [id number] Date of Birth: 03/13/1946  Today's Date: 01/05/2021 OT Individual Time: 0905-1005 OT Individual Time Calculation (min): 60 min    Short Term Goals: Week 1:  OT Short Term Goal 1 (Week 1): Pt will demonstrate improved RUE coordination to complete eating independently, opening containers himself. OT Short Term Goal 1 - Progress (Week 1): Met OT Short Term Goal 2 (Week 1): Pt will maintain sitting balance EOB with no lateral lean while donning clothing with no cues. OT Short Term Goal 2 - Progress (Week 1): Progressing toward goal OT Short Term Goal 3 (Week 1): Pt will don underwear and pants with CGA for balance only. OT Short Term Goal 3 - Progress (Week 1): Progressing toward goal OT Short Term Goal 4 (Week 1): Pt will be able to don and tie shoes independently. OT Short Term Goal 4 - Progress (Week 1): Met  Skilled Therapeutic Interventions/Progress Updates:    OT session focused on functional transfers, safety/body awareness, balance, and R NMR. Pt received supine in bed asking to shower. Completed supine>sit with supervision and stand pivot transfers during ADLs with min A using RW and mod cues for safety awareness. Pt completed shower with min A to wash feet and cues for thoroughness due to decreased attention. OT engaged client in dynamic standing balance task requiring him to reach L and R to retrieve bean bags then toss at target with RUE. Pt required min A for standing balance with LOB to R 2x requiring min-mod to correct. At end of session, pt returned to room and left in w/c with safety belt and all needs in reach.   Therapy Documentation Precautions:  Precautions Precautions: Fall Precaution Comments: R hemi, mild R inattention Restrictions Weight Bearing Restrictions: No General:   Vital Signs: Oxygen Therapy SpO2: 95 % O2 Device: Room Air Pain:   ADL: ADL Eating: Set  up Grooming: Setup Upper Body Bathing: Supervision/safety Where Assessed-Upper Body Bathing: Shower Lower Body Bathing: Contact guard Where Assessed-Lower Body Bathing: Shower Upper Body Dressing: Supervision/safety Lower Body Dressing: Minimal assistance Where Assessed-Lower Body Dressing: Wheelchair Toileting: Minimal assistance Where Assessed-Toileting: Glass blower/designer: Psychiatric nurse Method: Engineer, water: Grab bars, Raised toilet seat Social research officer, government: Environmental education officer Method: Education officer, environmental: Radio broadcast assistant, Systems analyst    Praxis   Exercises:   Other Treatments:     Therapy/Group: Individual Therapy  Duayne Cal 01/05/2021, 9:59 AM
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
## 2021-01-05 NOTE — Progress Notes (Signed)
PROGRESS NOTE   Subjective/Complaints:  Had sorbitol yesterday for lack of Bms- had 1 large BM last night and 1 at noon today.  Otherwise, no issues.    ROS:   Pt denies SOB, abd pain, CP, N/V/C/D, and vision changes  Objective:   No results found. No results for input(s): WBC, HGB, HCT, PLT in the last 72 hours.  No results for input(s): NA, K, CL, CO2, GLUCOSE, BUN, CREATININE, CALCIUM in the last 72 hours.   Intake/Output Summary (Last 24 hours) at 01/05/2021 1419 Last data filed at 01/05/2021 1337 Gross per 24 hour  Intake 504 ml  Output 651 ml  Net -147 ml        Physical Exam: Vital Signs Blood pressure 100/70, pulse 63, temperature 98 F (36.7 C), resp. rate 18, height 5\' 11"  (1.803 m), weight 83 kg, SpO2 95 %.     General: awake, alert, appropriate, NAD HENT: conjugate gaze; oropharynx moist CV: regular rate; no JVD Pulmonary: CTA B/L; no W/R/R- good air movement GI: soft, NT, ND, (+)BS Psychiatric: appropriate Neurological: alert; HOH Musculoskeletal:     Cervical back: Normal range of motion and neck supple.     Comments: Left elbow boggy olecranon bursa still swollen Skin:    General: Skin is warm and dry.  Neurological:     Mental Status: He is alert.     Comments: Alert Sl HOH Motor: LUE/LLE: 5/5 proximal distal RUE: 4 to 4+/5 proximal and distal with mild ataxia RLE: 4+/5 prox to distal   Assessment/Plan: 1. Functional deficits which require 3+ hours per day of interdisciplinary therapy in a comprehensive inpatient rehab setting. Physiatrist is providing close team supervision and 24 hour management of active medical problems listed below. Physiatrist and rehab team continue to assess barriers to discharge/monitor patient progress toward functional and medical goals  Care Tool:  Bathing    Body parts bathed by patient: Right arm, Left arm, Chest, Abdomen, Front perineal area,  Buttocks, Left upper leg, Right upper leg, Face   Body parts bathed by helper: Left lower leg, Right lower leg     Bathing assist Assist Level: Minimal Assistance - Patient > 75%     Upper Body Dressing/Undressing Upper body dressing   What is the patient wearing?: Pull over shirt    Upper body assist Assist Level: Set up assist    Lower Body Dressing/Undressing Lower body dressing      What is the patient wearing?: Underwear/pull up, Pants     Lower body assist Assist for lower body dressing: Minimal Assistance - Patient > 75%     Toileting Toileting    Toileting assist Assist for toileting: Minimal Assistance - Patient > 75%     Transfers Chair/bed transfer  Transfers assist     Chair/bed transfer assist level: Minimal Assistance - Patient > 75%     Locomotion Ambulation   Ambulation assist      Assist level: Minimal Assistance - Patient > 75% Assistive device: Walker-rolling Max distance: 32ft   Walk 10 feet activity   Assist  Walk 10 feet activity did not occur: Safety/medical concerns (fatigue, decreased balance, R hemi, decreased motor planning/coordination)  Assist level: Minimal Assistance - Patient > 75% Assistive device: Walker-rolling   Walk 50 feet activity   Assist Walk 50 feet with 2 turns activity did not occur: Safety/medical concerns (fatigue, decreased balance, R hemi, decreased motor planning/coordination)  Assist level: Moderate Assistance - Patient - 50 - 74% Assistive device: Walker-rolling    Walk 150 feet activity   Assist Walk 150 feet activity did not occur: Safety/medical concerns (fatigue, decreased balance, R hemi, decreased motor planning/coordination)         Walk 10 feet on uneven surface  activity   Assist Walk 10 feet on uneven surfaces activity did not occur: Safety/medical concerns         Wheelchair     Assist Will patient use wheelchair at discharge?: Yes Type of Wheelchair: Manual     Wheelchair assist level: Supervision/Verbal cueing Max wheelchair distance: 77ft    Wheelchair 50 feet with 2 turns activity    Assist        Assist Level: Supervision/Verbal cueing   Wheelchair 150 feet activity     Assist      Assist Level: Maximal Assistance - Patient 25 - 49%   Blood pressure 100/70, pulse 63, temperature 98 F (36.7 C), resp. rate 18, height 5\' 11"  (1.803 m), weight 83 kg, SpO2 95 %.  Medical Problem List and Plan: 1. 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               -patient may shower             -ELOS/Goals: 14 to 17 days/min a          con't PT and OT- CIR 2.  Impaired mobility: -DVT/anticoagulation:  SCDs/TEDs--d/ced Lovenox due to low platelets. PC 83 on 6/6.              -antiplatelet therapy: Continue ASA daily.  3. Pain Management: Tylenol prn. Continue lidocaine patch for low back pain. Add vitamin D level to Monday' labs  6/10- denies pain- con't current regimen 4. Mood: LCSW to follow for evaluation and support.              -antipsychotic agents: N/A  5. Neuropsych: This patient is capable of making decisions on his own behalf. 6. Skin/Wound Care: Routine pressure relief measures.  7. Fluids/Electrolytes/Nutrition: Monitor I/Os.            encourage PO, good appetite  6/6 I personally reviewed the patient's labs 8.  HTN: continue to monitor blood pressures TID--continue tenormin daily.            Vitals:   01/05/21 0532 01/05/21 0908  BP: 100/70   Pulse: 63   Resp: 18   Temp: 98 F (36.7 C)   SpO2: 90% 95%  soft BP, bradycardic will reduce atenolol to 12.5mg  daily, monitor for effects of yesterday's change  6/10- Pt's pulse is in 60s and BP is still soft, first dose at lower atenolol is this AM.   6/11- Pt's pulse in 60s- BP 110s-110s- con't regimen 9.  Thrombocytopenia: 6/6 platelets  remain in 80-90k range, no bleeding   -continue monitor serially 10.  Hyperglycemia: Hemoglobin A1c 5.5 and likely stress related. 11.  COPD: Stable on Anoro 12.  Dyslipidemia:.             --on Lovaza and Zocor 13.  AKI on CKD?: Serum creatinine 1.5 at admission.  Continue to monitor.               -encouraging fluids.   6/6 labs stable 14.  Left elbow cystic lesion: likely olecranon bursitis.  Outpatient f/u with [facility] 15. Anxiety/depresion: Continues on home regimen of Prozac 40 mg, Xanax 0.5 mg qid and Triazolam 0.25 mg/hs for insomnia.   16. Constipation:  Colace with supper. Added prune juice with meals. Mag citrate after therapy on 6/5.   -continue senna-s at bedtime with miralax in the am  6/10- added Colace and ordered Sorbitol 60cc if no BM by 5pm.   6/11- LBM this morning- and 1 yesterday- con't regimen 17. Tobacco abuse: continue nicotine patch 18. Bra6/11- bradycardia better- 60-68 in last 24 hours-con't regimen      LOS: 10 days A FACE TO FACE EVALUATION WAS PERFORMED  Sha Amer 01/05/2021, 2:19 PM

## 2021-01-05 NOTE — Plan of Care (Signed)
  Problem: Consults Goal: RH STROKE PATIENT EDUCATION Description: See Patient Education module for education specifics  Outcome: Progressing   Problem: RH BOWEL ELIMINATION Goal: RH STG MANAGE BOWEL WITH ASSISTANCE Description: STG Manage Bowel with min Assistance. Outcome: Progressing Goal: RH STG MANAGE BOWEL W/MEDICATION W/ASSISTANCE Description: STG Manage Bowel with Medication with min Assistance. Outcome: Progressing   Problem: RH BLADDER ELIMINATION Goal: RH STG MANAGE BLADDER WITH ASSISTANCE Description: STG Manage Bladder With min Assistance Outcome: Progressing Goal: RH STG MANAGE BLADDER WITH MEDICATION WITH ASSISTANCE Description: STG Manage Bladder With Medication With min Assistance. Outcome: Progressing   Problem: RH SKIN INTEGRITY Goal: RH STG MAINTAIN SKIN INTEGRITY WITH ASSISTANCE Description: STG Maintain Skin Integrity With min Assistance. Outcome: Progressing Goal: RH STG ABLE TO PERFORM INCISION/WOUND CARE W/ASSISTANCE Description: STG Able To Perform Skin Care With min Assistance. Outcome: Progressing   Problem: RH PAIN MANAGEMENT Goal: RH STG PAIN MANAGED AT OR BELOW PT'S PAIN GOAL Description: < 3 on a 0-10 pain scale. Outcome: Progressing   Problem: RH KNOWLEDGE DEFICIT Goal: RH STG INCREASE KNOWLEDGE OF HYPERTENSION Description: Patient will be able to demonstrate knowledge of HTN medications, dietary restrictions, BP parameters with educational materials and handouts provided by staff independently at discharge. Outcome: Progressing Goal: RH STG INCREASE KNOWLEDGE OF STROKE PROPHYLAXIS Description: Patient will be able to demonstrate knowledge of medications used to prevent future strokes with educational materials and handouts provided by staff independently at discharge. Outcome: Progressing

## 2021-01-06 NOTE — Progress Notes (Signed)
Speech Language Pathology Daily Session Note  Patient Details  Name: Juan Brewer MRN: [id number] Date of Birth: [birth year]  Today's Date: 01/06/2021 SLP Individual Time: 0930-1015 SLP Individual Time Calculation (min): 45 min  Short Term Goals: Week 1: SLP Short Term Goal 1 (Week 1): Patient will complete medication management simulation with minA cues for accuracy. SLP Short Term Goal 2 (Week 1): Patient will complete mild complex level money/financial management tasks with minA cues. SLP Short Term Goal 3 (Week 1): Patient will perform alternating attention functional tasks with modA cues for attention to and attempts at correction of errors. SLP Short Term Goal 4 (Week 1): Patient will recall specific therapeutic interventions on a daily basis with supervisionA. SLP Short Term Goal 5 (Week 1): Patient will demonstrate anticipatory awareness for impact of his deficits on function, with minA cues.  Skilled Therapeutic Interventions: Skilled SLP intervention focused on cognition. Pt completed calendar organization task with min A verbal cues. He completed accurately and required intermittent cues on relevant information that needed to be added for each event on to calendar.Pt recalled 3 tasks completed in physical therapy and 2 physical limitations. He continues to require education on purpose of speech therapy for cognition and education on current cognitive function. Pt left seated upright in bed with bed alarm set and call button within reach. Cont with therapy per plan of care.      Pain Pain Assessment Pain Scale: Faces Pain Score: 0-No pain  Therapy/Group: Individual Therapy  Amil Amen A Shemicka Cohrs 01/06/2021, 10:09 AM

## 2021-01-06 NOTE — Progress Notes (Signed)
Physical Therapy Session Note  Patient Details  Name: Juan Brewer MRN: [id number] Date of Birth: [birth year]  Today's Date: 01/06/2021 PT Individual Time: 0801-0900 PT Individual Time Calculation (min): 59 min   Short Term Goals: Week 2:  PT Short Term Goal 1 (Week 2): pt will transfer sit<>stand with LRAD and CGA PT Short Term Goal 2 (Week 2): pt will transfer bed<>chair with LRAD and CGA PT Short Term Goal 3 (Week 2): pt will perform simulated car transfer with LRAD and min A  Skilled Therapeutic Interventions/Progress Updates:  Chart reviewed. Pt found supine in bed and agreeable to PT. Session focused on functional mobility and week 2 goals.  Pt participated in SPT from bed to [facility] with CGA + RW and vc/tc for safe WC management. From [facility], pt required set up assistance for upper body dressing and CGA for lower body dressing to change from soiled clothing. Pt practiced standing balance at sink during teeth brushing with CGA + unilateral hand hold on counter surface.  Pt transferred to rehab gym for time management. Pt practiced household mobility participating in 3 x ambulation of 163ft requiring CGA + RW for walking a straight path and MinA + RW for managing 90 deg and 180 deg turns. Pt then practiced LE strengthening completing 2x10 step ups on 6 inch step. Pt initially required B hand rails + CGA, but progressed to completing steps up with L handrail + CGA. Pt then participated in functional mobility practice for community access. Pt completed 2x ambulation along 25ft ramp with MinA + RW. Pt completed therapy with functional transfer practice of squat pivot transfer with no AD and CGA from WC to recliner.  At end of session pt was left seated in recliner with alarm engaged, call bell and all needs in reach.  Therapy Documentation Precautions:  Precautions Precautions: Fall Precaution Comments: R hemi, mild R inattention Restrictions Weight Bearing Restrictions: No  Pain: Pain  Assessment Pain Scale: Faces Pain Score: 0-No pain Faces Pain Scale: No hurt    Therapy/Group: Individual Therapy  Perrin Maltese, PT, DPT 01/06/2021, 12:46 PM

## 2021-01-06 NOTE — Plan of Care (Signed)
  Problem: Consults Goal: RH STROKE PATIENT EDUCATION Description: See Patient Education module for education specifics  Outcome: Progressing   Problem: RH BOWEL ELIMINATION Goal: RH STG MANAGE BOWEL WITH ASSISTANCE Description: STG Manage Bowel with min Assistance. Outcome: Progressing Goal: RH STG MANAGE BOWEL W/MEDICATION W/ASSISTANCE Description: STG Manage Bowel with Medication with min Assistance. Outcome: Progressing   Problem: RH BLADDER ELIMINATION Goal: RH STG MANAGE BLADDER WITH ASSISTANCE Description: STG Manage Bladder With min Assistance Outcome: Progressing Goal: RH STG MANAGE BLADDER WITH MEDICATION WITH ASSISTANCE Description: STG Manage Bladder With Medication With min Assistance. Outcome: Progressing   Problem: RH SKIN INTEGRITY Goal: RH STG MAINTAIN SKIN INTEGRITY WITH ASSISTANCE Description: STG Maintain Skin Integrity With min Assistance. Outcome: Progressing Goal: RH STG ABLE TO PERFORM INCISION/WOUND CARE W/ASSISTANCE Description: STG Able To Perform Skin Care With min Assistance. Outcome: Progressing   Problem: RH PAIN MANAGEMENT Goal: RH STG PAIN MANAGED AT OR BELOW PT'S PAIN GOAL Description: < 3 on a 0-10 pain scale. Outcome: Progressing   Problem: RH KNOWLEDGE DEFICIT Goal: RH STG INCREASE KNOWLEDGE OF HYPERTENSION Description: Patient will be able to demonstrate knowledge of HTN medications, dietary restrictions, BP parameters with educational materials and handouts provided by staff independently at discharge. Outcome: Progressing Goal: RH STG INCREASE KNOWLEDGE OF STROKE PROPHYLAXIS Description: Patient will be able to demonstrate knowledge of medications used to prevent future strokes with educational materials and handouts provided by staff independently at discharge. Outcome: Progressing   

## 2021-01-06 NOTE — Progress Notes (Signed)
Occupational Therapy Session Note  Patient Details  Name: Juan Brewer MRN: [id number] Date of Birth: Jun 02, 1946  Today's Date: 01/06/2021 OT Individual Time: 0768-0881 OT Individual Time Calculation (min): 40 min    Short Term Goals: Week 2:  OT Short Term Goal 1 (Week 2): STG = LTG due to LOS  Skilled Therapeutic Interventions/Progress Updates:    Patient in bed, alert.  He requests shower to start session.   Supine to sitting edge of bed with min A.  Ambulation with RW to/from bed, shower bench, w/c with CG/min A, cues for walker placement and turning.  Able to doff clothing with CGA/CS with cues for safety.  Shower completed with CS while seated.  Dressing completed seated on shower bench with CS in seated position and CGA in stance.  Ambulated to sink to complete oral care requiring CGA  - one significant LOB when turning to walk back to bed surface requiring mod/max A to regain stance.   Ambulation with RW to/from therapy gym with CGA, min cues.  He completed standing dynamic stepping and weight shift activities in stance with CGA and cues for balance strategies.  No LOB with structured task in quiet gym space.  He returned to bed at close of session, bed alarm set and call bell in hand.    Therapy Documentation Precautions:  Precautions Precautions: Fall Precaution Comments: R hemi, mild R inattention Restrictions Weight Bearing Restrictions: No   Therapy/Group: Individual Therapy  Barrie Lyme 01/06/2021, 7:37 AM

## 2021-01-07 LAB — BASIC METABOLIC PANEL
Anion gap: 6 (ref 5–15)
BUN: 21 mg/dL (ref 8–23)
CO2: 28 mmol/L (ref 22–32)
Calcium: 9.9 mg/dL (ref 8.9–10.3)
Chloride: 105 mmol/L (ref 98–111)
Creatinine, Ser: 1.39 mg/dL — ABNORMAL HIGH (ref 0.61–1.24)
GFR, Estimated: 54 mL/min — ABNORMAL LOW (ref >60)
Glucose, Bld: 111 mg/dL — ABNORMAL HIGH (ref 70–99)
Potassium: 3.9 mmol/L (ref 3.5–5.1)
Sodium: 139 mmol/L (ref 135–145)

## 2021-01-07 LAB — CBC
HCT: 41.2 % (ref 39.0–52.0)
Hemoglobin: 14.1 g/dL (ref 13.0–17.0)
MCH: 31.2 pg (ref 26.0–34.0)
MCHC: 34.2 g/dL (ref 30.0–36.0)
MCV: 91.2 fL (ref 80.0–100.0)
Platelets: 107 10*3/uL — ABNORMAL LOW (ref 150–400)
RBC: 4.52 MIL/uL (ref 4.22–5.81)
RDW: 12.4 % (ref 11.5–15.5)
WBC: 5.8 10*3/uL (ref 4.0–10.5)
nRBC: 0 % (ref 0.0–0.2)

## 2021-01-07 LAB — VITAMIN D 25 HYDROXY (VIT D DEFICIENCY, FRACTURES): Vit D, 25-Hydroxy: 49.96 ng/mL (ref 30–100)

## 2021-01-07 MED ORDER — VITAMIN D 25 MCG (1000 UNIT) PO TABS
1000.0000 [IU] | ORAL_TABLET | Freq: Every day | ORAL | Status: DC
  Administered 2021-01-07 – 2021-01-12 (×6): 1000 [IU] via ORAL
  Filled 2021-01-07 (×6): qty 1

## 2021-01-07 NOTE — Plan of Care (Signed)
  Problem: Consults Goal: RH STROKE PATIENT EDUCATION Description: See Patient Education module for education specifics  Outcome: Progressing   Problem: RH BOWEL ELIMINATION Goal: RH STG MANAGE BOWEL WITH ASSISTANCE Description: STG Manage Bowel with min Assistance. Outcome: Progressing Goal: RH STG MANAGE BOWEL W/MEDICATION W/ASSISTANCE Description: STG Manage Bowel with Medication with min Assistance. Outcome: Progressing   Problem: RH BLADDER ELIMINATION Goal: RH STG MANAGE BLADDER WITH ASSISTANCE Description: STG Manage Bladder With min Assistance Outcome: Progressing Goal: RH STG MANAGE BLADDER WITH MEDICATION WITH ASSISTANCE Description: STG Manage Bladder With Medication With min Assistance. Outcome: Progressing   Problem: RH SKIN INTEGRITY Goal: RH STG MAINTAIN SKIN INTEGRITY WITH ASSISTANCE Description: STG Maintain Skin Integrity With min Assistance. Outcome: Progressing Goal: RH STG ABLE TO PERFORM INCISION/WOUND CARE W/ASSISTANCE Description: STG Able To Perform Skin Care With min Assistance. Outcome: Progressing   Problem: RH PAIN MANAGEMENT Goal: RH STG PAIN MANAGED AT OR BELOW PT'S PAIN GOAL Description: < 3 on a 0-10 pain scale. Outcome: Progressing   Problem: RH KNOWLEDGE DEFICIT Goal: RH STG INCREASE KNOWLEDGE OF HYPERTENSION Description: Patient will be able to demonstrate knowledge of HTN medications, dietary restrictions, BP parameters with educational materials and handouts provided by staff independently at discharge. Outcome: Progressing Goal: RH STG INCREASE KNOWLEDGE OF STROKE PROPHYLAXIS Description: Patient will be able to demonstrate knowledge of medications used to prevent future strokes with educational materials and handouts provided by staff independently at discharge. Outcome: Progressing   

## 2021-01-07 NOTE — Progress Notes (Signed)
Speech Language Pathology Daily Session Note  Patient Details  Name: Joandy Burget MRN: [id number] Date of Birth: [birth year]  Today's Date: 01/07/2021 SLP Individual Time: 1130-1155 SLP Individual Time Calculation (min): 25 min  Short Term Goals: Week 1: SLP Short Term Goal 1 (Week 1): Patient will complete medication management simulation with minA cues for accuracy. SLP Short Term Goal 2 (Week 1): Patient will complete mild complex level money/financial management tasks with minA cues. SLP Short Term Goal 3 (Week 1): Patient will perform alternating attention functional tasks with modA cues for attention to and attempts at correction of errors. SLP Short Term Goal 4 (Week 1): Patient will recall specific therapeutic interventions on a daily basis with supervisionA. SLP Short Term Goal 5 (Week 1): Patient will demonstrate anticipatory awareness for impact of his deficits on function, with minA cues.  Skilled Therapeutic Interventions: Skilled SLP intervention focused on cognition. Pt completed pill sorting task with mod a then cues faded to min A once familiar with layout of pill organizer. He was able to recall medications that he previously took at home and times taken. Pt required redirection x1 during task. He demonstrated good awareness of errors in placement of pills x2 with no cueing required. Cont with therapy per plan of care.      Pain Pain Assessment Pain Scale: Faces Pain Score: 0-No pain Faces Pain Scale: No hurt  Therapy/Group: Individual Therapy  Carlean Jews Marzell Allemand 01/07/2021, 12:29 PM

## 2021-01-07 NOTE — Progress Notes (Signed)
PROGRESS NOTE   Subjective/Complaints: No complaints this morning Moving bowels regularly Ambulating well with therapy Cr 1.39, placed nursing order to encourage 6-8 glasses of water per day   ROS:   Pt denies SOB, abd pain, CP, N/V/C/D, and vision changes, constipation  Objective:   No results found. Recent Labs    01/07/21 0625  WBC 5.8  HGB 14.1  HCT 41.2  PLT 107*    Recent Labs    01/07/21 0625  NA 139  K 3.9  CL 105  CO2 28  GLUCOSE 111*  BUN 21  CREATININE 1.39*  CALCIUM 9.9     Intake/Output Summary (Last 24 hours) at 01/07/2021 1529 Last data filed at 01/07/2021 0740 Gross per 24 hour  Intake 458 ml  Output 450 ml  Net 8 ml        Physical Exam: Vital Signs Blood pressure 121/70, pulse 64, temperature 97.7 F (36.5 C), resp. rate 18, height 5\' 11"  (1.803 m), weight 83 kg, SpO2 96 %. Gen: no distress, normal appearing HEENT: oral mucosa pink and moist, NCAT Cardio: Reg rate Chest: normal effort, normal rate of breathing Abd: soft, non-distended Ext: no edema Psych: pleasant, normal affect Musculoskeletal:     Cervical back: Normal range of motion and neck supple.     Comments: Left elbow boggy olecranon bursa still swollen Skin:    General: Skin is warm and dry.  Neurological:     Mental Status: He is alert.     Comments: Alert Sl HOH Motor: LUE/LLE: 5/5 proximal distal RUE: 4 to 4+/5 proximal and distal with mild ataxia RLE: 4+/5 prox to distal   Assessment/Plan: 1. Functional deficits which require 3+ hours per day of interdisciplinary therapy in a comprehensive inpatient rehab setting. Physiatrist is providing close team supervision and 24 hour management of active medical problems listed below. Physiatrist and rehab team continue to assess barriers to discharge/monitor patient progress toward functional and medical goals  Care Tool:  Bathing    Body parts bathed by  patient: Right arm, Left arm, Chest, Abdomen, Front perineal area, Right upper leg, Left upper leg, Right lower leg, Face, Left lower leg, Buttocks   Body parts bathed by helper: Left lower leg, Right lower leg     Bathing assist Assist Level: Supervision/Verbal cueing     Upper Body Dressing/Undressing Upper body dressing   What is the patient wearing?: Pull over shirt    Upper body assist Assist Level: Supervision/Verbal cueing    Lower Body Dressing/Undressing Lower body dressing      What is the patient wearing?: Underwear/pull up, Pants     Lower body assist Assist for lower body dressing: Contact Guard/Touching assist     Toileting Toileting    Toileting assist Assist for toileting: Minimal Assistance - Patient > 75%     Transfers Chair/bed transfer  Transfers assist     Chair/bed transfer assist level: Contact Guard/Touching assist     Locomotion Ambulation   Ambulation assist      Assist level: Minimal Assistance - Patient > 75% Assistive device: Walker-rolling Max distance: 118ft   Walk 10 feet activity   Assist  Walk 10 feet  activity did not occur: Safety/medical concerns (fatigue, decreased balance, R hemi, decreased motor planning/coordination)  Assist level: Contact Guard/Touching assist Assistive device: Walker-rolling   Walk 50 feet activity   Assist Walk 50 feet with 2 turns activity did not occur: Safety/medical concerns (fatigue, decreased balance, R hemi, decreased motor planning/coordination)  Assist level: Contact Guard/Touching assist Assistive device: Walker-rolling    Walk 150 feet activity   Assist Walk 150 feet activity did not occur: Safety/medical concerns (fatigue, decreased balance, R hemi, decreased motor planning/coordination)  Assist level: Contact Guard/Touching assist Assistive device: Walker-rolling    Walk 10 feet on uneven surface  activity   Assist Walk 10 feet on uneven surfaces activity did not  occur: Safety/medical concerns         Wheelchair     Assist Will patient use wheelchair at discharge?: Yes Type of Wheelchair: Manual    Wheelchair assist level: Supervision/Verbal cueing Max wheelchair distance: 68ft    Wheelchair 50 feet with 2 turns activity    Assist        Assist Level: Supervision/Verbal cueing   Wheelchair 150 feet activity     Assist      Assist Level: Maximal Assistance - Patient 25 - 49%   Blood pressure 121/70, pulse 64, temperature 97.7 F (36.5 C), resp. rate 18, height 5\' 11"  (1.803 m), weight 83 kg, SpO2 96 %.  Medical Problem List and Plan: 1. 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               -patient may shower             -ELOS/Goals: 14 to 17 days/min a          Continue PT and OT- CIR 2.  Impaired mobility: -DVT/anticoagulation:  SCDs/TEDs--d/ced Lovenox due to low platelets. PC 83 on 6/6.              -antiplatelet therapy: continue ASA daily.  3. Pain Management: Tylenol prn. Continue lidocaine patch for low back pain. Vitamin D level 49. Start 1,000U daily supplement.  4. Mood: LCSW to follow for evaluation and support.              -antipsychotic agents: N/A  5. Neuropsych: This patient is capable of making decisions on his own behalf. 6. Skin/Wound Care: Routine pressure relief measures.  7. Fluids/Electrolytes/Nutrition: Monitor I/Os.            encourage PO, good appetite  6/13 I personally reviewed the patient's labs 8.  HTN: continue to monitor blood pressures TID--continue tenormin daily.            Vitals:   01/07/21 0848 01/07/21 1330  BP:  121/70  Pulse:  64  Resp:  18  Temp:  97.7 F (36.5 C)  SpO2: 99% 96%  soft BP, bradycardic will reduce atenolol to 12.5mg  daily, monitor for effects of yesterday's change  6/10- Pt's pulse is in 60s and BP is still soft,  first dose at lower atenolol is this AM.   6/11- Pt's pulse in 60s- BP 110s-110s- con't regimen 9.  Thrombocytopenia: 6/6 platelets remain in 80-90k range, no bleeding   -continue monitor serially 10.  Hyperglycemia: Hemoglobin A1c 5.5 and likely stress related. 11.  COPD: Stable on Anoro 12.  Dyslipidemia:.             --on Lovaza and Zocor 13.  AKI on  CKD?: Serum creatinine 1.5 at admission. Continue to monitor.               Placed nursing order to encourage 6-8 glasses of water per day  6/6 labs stable 14.  Left elbow cystic lesion: likely olecranon bursitis.  Outpatient f/[facility] with [facility] 15. Anxiety/depresion: Continues on home regimen of Prozac 40 mg, Xanax 0.5 mg qid and Triazolam 0.25 mg/hs for insomnia.   16. Constipation:  Colace with supper. Added prune juice with meals. Mag citrate after therapy on 6/5.   -continue senna-s at bedtime with miralax in the am  6/10- added Colace and ordered Sorbitol 60cc if no BM by 5pm.   6/11- LBM this morning- and 1 yesterday- con't regimen 17. Tobacco abuse: continue nicotine patch 18. Bra6/11- bradycardia better- 60-68 in last 24 hours-con't regimen      LOS: 12 days A FACE TO FACE EVALUATION WAS PERFORMED  Clint Bolder P Kyrese Gartman 01/07/2021, 3:29 PM

## 2021-01-07 NOTE — Progress Notes (Signed)
Occupational Therapy Session Note  Patient Details  Name: Juan Brewer MRN: [id number] Date of Birth: [birth year]  Today's Date: 01/07/2021 OT Individual Time: 9758-8325 OT Individual Time Calculation (min): 54 min    Short Term Goals: Week 2:  OT Short Term Goal 1 (Week 2): STG = LTG due to LOS  Skilled Therapeutic Interventions/Progress Updates:   Met pt lying supine in bed, bed alarm on, pt agreed to session. Treatment session was focused on ADL retraining and improving RUE coordination while controlling posture. Pt was supervision for lying > sitting EOB. Pt was CGA for sit > stand to RW. Pt ambulated to bathroom using RW, needing safety cues when wheeling over bathroom ledge. Pt was Min A for sit > stand to raised toilet, due to lack of motor planning and undershooting toilet. Pt doffed most clothing while sitting on the toilet, needing to stand to doff LB clothing. Pt ambulated with RW to shower bench and stand pivoted primarily using grab bars to enter bathroom.  Pt was supervision for bathing, but required min cues sequencing LB bathing. Pt used figure-4 method for applying yellow grip socks for safety. Pt ambulated with RW to closet to find clothes, to improve BUE coordination and dynamic standing balance. Pt ambulated back to EOB to complete dressing tasks. OTS provided verbal cues for safety when transferring to sitting. OTS instructed pt to re-do transfer to improve transfer performance and begin a habit of properly transferring. Pt was supervision for UB dressing sitting EOB. Pt was CGA for LB dressing while standing due to lack of safety awareness. Pt applied ted hose with Min A, requiring assistance and cues to properly adjust socks. Pt expressed " You [OTS] do it [donning socks], I don't know why I have to do it." OTS provided education on importance of independence when completing tasks for d/c home. Pt was set-up for completing grooming while standing at sink. Pt was brought to  ortho gym and completed a BITS activity while using RUE. Pt dynamically stood for 5 minutes without a rest break. Pt was brought back to room, left in recliner, safety belt on, all needs met.   Therapy Documentation Precautions:  Precautions Precautions: Fall Precaution Comments: R hemi, mild R inattention Restrictions Weight Bearing Restrictions: No General:   Vital Signs: Oxygen Therapy SpO2: 99 % O2 Device: Room Air Pain: Pain Assessment Pain Scale: 0-10 Pain Score: 0-No pain ADL: ADL Eating: Set up Grooming: Setup Where Assessed-Grooming: Standing at sink Upper Body Bathing: Supervision/safety, Minimal cueing Where Assessed-Upper Body Bathing: Shower Lower Body Bathing: Supervision/safety, Minimal cueing Where Assessed-Lower Body Bathing: Shower Upper Body Dressing: Supervision/safety Where Assessed-Upper Body Dressing: Edge of bed Lower Body Dressing: Contact guard Where Assessed-Lower Body Dressing: Edge of bed Toileting: Minimal assistance Where Assessed-Toileting: Glass blower/designer: Psychiatric nurse Method: Arts development officer: Grab bars, Raised toilet seat Social research officer, government: Curator Method: Radiographer, therapeutic: Radio broadcast assistant, Grab bars    Therapy/Group: Individual Therapy  [name] 01/07/2021, 9:50 AM

## 2021-01-07 NOTE — Progress Notes (Signed)
Physical Therapy Session Note  Patient Details  Name: Juan Brewer MRN: [id number] Date of Birth: Nov 28, 1945  Today's Date: 01/07/2021 PT Individual Time: 1420-1504 PT Individual Time Calculation (min): 44 min   Short Term Goals: Week 2:  PT Short Term Goal 1 (Week 2): pt will transfer sit<>stand with LRAD and CGA PT Short Term Goal 2 (Week 2): pt will transfer bed<>chair with LRAD and CGA PT Short Term Goal 3 (Week 2): pt will perform simulated car transfer with LRAD and min A  Skilled Therapeutic Interventions/Progress Updates:    Patient in supine and reports no pain.  Supine to sit with S.  Seated to don slipper socks with S.  Sit to stand CGA and ambulated to therapy gym x 140' with w/c close if needed and CGA with min cues for attention and safety with walker proximity and stride length.  Patient hitting heels with each step with R foot and not aware.  Standing on R for L step over R for int hip rotator strength.  Sit to supine with S and using orange t-band for L hip IR strength in hooklying with cues for slow return 2 x 10 reps.  Single limb bridge on R with L SLR x 10 mod cues.  Hooklying pelvit tilt and hold while marching x 5 reps mod cues and facilitation.  Supine to sit S.  Ambulated with RW and CGA figure 8 around 2 cones x 6 reps with mod cues for clearing cones on R.  Seated recall of deficits, and pt reporting functional impact of deficits so with mod cues able to state R side weakness and incoordination and with continued cues able to educate about inattention to R side as well as hitting cone x 2 on R with figure 8 task.  Patient ambulated to room with min A when distracted due to veering to L and walker too far out.  Patient sit to supine with S.  Left with call bell in reach and bed alarm active.   Therapy Documentation Precautions:  Precautions Precautions: Fall Precaution Comments: R hemi, mild R inattention Restrictions Weight Bearing Restrictions: No  Pain: Pain  Assessment Pain Score: 0-No pain    Therapy/Group: Individual Therapy  [name] Sheran Lawless, PT 01/07/2021, 8:59 AM

## 2021-01-07 NOTE — Progress Notes (Signed)
Physical Therapy Session Note  Patient Details  Name: Juan Brewer MRN: [id number] Date of Birth: 1946-04-11  Today's Date: 01/07/2021 PT Individual Time: 1001-1057 PT Individual Time Calculation (min): 56 min   Short Term Goals: Week 1:  PT Short Term Goal 1 (Week 1): pt will transfer sit<>stand with LRAD and CGA PT Short Term Goal 1 - Progress (Week 1): Progressing toward goal PT Short Term Goal 2 (Week 1): pt will transfer bed<>chair with LRAD and CGA PT Short Term Goal 2 - Progress (Week 1): Progressing toward goal PT Short Term Goal 3 (Week 1): pt will ambulate 79f with LRAD and max of 1 PT Short Term Goal 3 - Progress (Week 1): Met Week 2:  PT Short Term Goal 1 (Week 2): pt will transfer sit<>stand with LRAD and CGA PT Short Term Goal 2 (Week 2): pt will transfer bed<>chair with LRAD and CGA PT Short Term Goal 3 (Week 2): pt will perform simulated car transfer with LRAD and min A  Skilled Therapeutic Interventions/Progress Updates:   Received pt sitting in recliner, pt agreeable to PT treatment, and denied any pain during session. Session with emphasis on functional mobility/transfers, generalized strengthening, dynamic standing balance/coordination, gait training, NMR, and improved activity tolerance. Pt transferred recliner<>WC stand<>pivot without AD and CGA with cues [facility] halfway on armrest. [facility] and performed simulated car transfer without AD and CGA. [facility] and ambulated 1820fwith RW and CGA/min A. Pt demonstrates improvements in upright posture, bilateral knee extension, and some with RW safety but continues to require cues for proximity to RW and to increase RLE foot clearance. Pt performed BUE/LE strengthening on Nustep at workload 5 for 8 minutes for a total of 351 steps for improved cardiovascular endurance and reciprocal movement training. Pt ambulated additional 20100fwith RW and CGA to mat and worked on dynamic standing balance performing alternating toe taps to 3in step 3x10 bilaterally with mod handheld assist. Pt with 1 instance of R leg buckling resulting in posterior descent onto mat. Transitioned to squats without UE support x12 increasing to squats holding 2.2lb medicine ball in RUE for additional 2x12 with CGA for balance incorporating functional use of RUE. Worked on dynamic standing balance tossing ball in air while standing statically with min A for balance 4x20 reps. [facility] and requested to return to bed. WC<>bed stand<>pivot without AD and CGA and sit<>supine with supervision. Concluded session with pt supine in bed, needs within reach, and bed alarm on.   Therapy Documentation Precautions:  Precautions Precautions: Fall Precaution Comments: R hemi, mild R inattention Restrictions Weight Bearing Restrictions: No  Therapy/Group: Individual Therapy AnnAlfonse Alpers, DPT   01/07/2021, 7:25 AM

## 2021-01-08 NOTE — Progress Notes (Signed)
PROGRESS NOTE   Subjective/Complaints: No complaints Team conference today   ROS:   Pt denies SOB, abd pain, CP, N/V/C/D, and vision changes, constipation  Objective:   No results found. Recent Labs    01/07/21 0625  WBC 5.8  HGB 14.1  HCT 41.2  PLT 107*    Recent Labs    01/07/21 0625  NA 139  K 3.9  CL 105  CO2 28  GLUCOSE 111*  BUN 21  CREATININE 1.39*  CALCIUM 9.9     Intake/Output Summary (Last 24 hours) at 01/08/2021 1113 Last data filed at 01/08/2021 0730 Gross per 24 hour  Intake 640 ml  Output 1300 ml  Net -660 ml        Physical Exam: Vital Signs Blood pressure 115/71, pulse 64, temperature 97.9 F (36.6 C), resp. rate 18, height 5\' 11"  (1.803 m), weight 83 kg, SpO2 (!) 88 %. Gen: no distress, normal appearing HEENT: oral mucosa pink and moist, NCAT Cardio: Reg rate Chest: normal effort, normal rate of breathing Abd: soft, non-distended Ext: no edema Psych: pleasant, normal affect Musculoskeletal:     Cervical back: Normal range of motion and neck supple.     Comments: Left elbow boggy olecranon bursa still swollen Skin:    General: Skin is warm and dry.  Neurological:     Mental Status: He is alert.     Comments: Alert Sl HOH Motor: LUE/LLE: 5/5 proximal distal RUE: 4 to 4+/5 proximal and distal with mild ataxia RLE: 4+/5 prox to distal   Assessment/Plan: 1. Functional deficits which require 3+ hours per day of interdisciplinary therapy in a comprehensive inpatient rehab setting. Physiatrist is providing close team supervision and 24 hour management of active medical problems listed below. Physiatrist and rehab team continue to assess barriers to discharge/monitor patient progress toward functional and medical goals  Care Tool:  Bathing    Body parts bathed by patient: Right arm, Left arm, Chest, Abdomen, Front perineal area, Right upper leg, Left upper leg, Right lower  leg, Face, Left lower leg, Buttocks   Body parts bathed by helper: Left lower leg, Right lower leg     Bathing assist Assist Level: Supervision/Verbal cueing     Upper Body Dressing/Undressing Upper body dressing   What is the patient wearing?: Pull over shirt    Upper body assist Assist Level: Supervision/Verbal cueing    Lower Body Dressing/Undressing Lower body dressing      What is the patient wearing?: Underwear/pull up, Pants     Lower body assist Assist for lower body dressing: Contact Guard/Touching assist     Toileting Toileting    Toileting assist Assist for toileting: Contact Guard/Touching assist     Transfers Chair/bed transfer  Transfers assist     Chair/bed transfer assist level: Contact Guard/Touching assist     Locomotion Ambulation   Ambulation assist      Assist level: Minimal Assistance - Patient > 75% Assistive device: Walker-rolling Max distance: 145ft   Walk 10 feet activity   Assist  Walk 10 feet activity did not occur: Safety/medical concerns (fatigue, decreased balance, R hemi, decreased motor planning/coordination)  Assist level: Contact Guard/Touching assist Assistive  device: Walker-rolling   Walk 50 feet activity   Assist Walk 50 feet with 2 turns activity did not occur: Safety/medical concerns (fatigue, decreased balance, R hemi, decreased motor planning/coordination)  Assist level: Contact Guard/Touching assist Assistive device: Walker-rolling    Walk 150 feet activity   Assist Walk 150 feet activity did not occur: Safety/medical concerns (fatigue, decreased balance, R hemi, decreased motor planning/coordination)  Assist level: Contact Guard/Touching assist Assistive device: Walker-rolling    Walk 10 feet on uneven surface  activity   Assist Walk 10 feet on uneven surfaces activity did not occur: Safety/medical concerns         Wheelchair     Assist Will patient use wheelchair at discharge?:  Yes Type of Wheelchair: Manual    Wheelchair assist level: Supervision/Verbal cueing Max wheelchair distance: 27ft    Wheelchair 50 feet with 2 turns activity    Assist        Assist Level: Supervision/Verbal cueing   Wheelchair 150 feet activity     Assist      Assist Level: Maximal Assistance - Patient 25 - 49%   Blood pressure 115/71, pulse 64, temperature 97.9 F (36.6 C), resp. rate 18, height 5\' 11"  (1.803 m), weight 83 kg, SpO2 (!) 88 %.  Medical Problem List and Plan: 1. 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               -patient may shower             -ELOS/Goals: 14 to 17 days/min a          Continue PT and OT- CIR 2.  Impaired mobility: -DVT/anticoagulation:  SCDs/TEDs--d/ced Lovenox due to low platelets. PC 83 on 6/6.              -antiplatelet therapy: continue ASA daily.  3. Pain Management: Tylenol prn. Continue lidocaine patch for low back pain. Vitamin D level 49. Start 1,000U daily supplement.  4. Mood: LCSW to follow for evaluation and support.              -antipsychotic agents: N/A  5. Neuropsych: This patient is capable of making decisions on his own behalf. 6. Skin/Wound Care: Routine pressure relief measures.  7. Fluids/Electrolytes/Nutrition: Monitor I/Os.            encourage PO, good appetite  6/13 I personally reviewed the patient's labs 8.  HTN: continue to monitor blood pressures TID--continue tenormin daily.            Vitals:   01/07/21 1945 01/08/21 0439  BP: 110/77 115/71  Pulse: 66 64  Resp: 17 18  Temp: 98.1 F (36.7 C) 97.9 F (36.6 C)  SpO2: 93% (!) 88%   9.  Thrombocytopenia: 6/6 platelets remain in 80-90k range, no bleeding   -continue monitor serially 10.  Hyperglycemia: Hemoglobin A1c 5.5 and likely stress related. 11.  COPD: Stable on Anoro 12.  Dyslipidemia:.              --on Lovaza and Zocor 13.  AKI on CKD?: Serum creatinine 1.5 at admission. Continue to monitor.               Placed nursing order to encourage 6-8 glasses of water per day  6/13 labs stable, monitor outpatient 14.  Left elbow cystic lesion: likely olecranon bursitis.  Outpatient f/u with [facility] 15. Anxiety/depresion: Continues on home  regimen of Prozac 40 mg, Xanax 0.5 mg qid and Triazolam 0.25 mg/hs for insomnia.   16. Constipation:  Colace with supper. Added prune juice with meals. Mag citrate after therapy on 6/5.   -continue senna-s at bedtime with miralax in the am  6/10- added Colace and ordered Sorbitol 60cc if no BM by 5pm.   6/11- LBM this morning- and 1 yesterday- con't regimen 17. Tobacco abuse: continue nicotine patch 18. Bra6/14- bradycardia better- 60-68 in last 24 hours-continue regimen 19. Disposition: HFU scheduled with me [id number] 11:40AM HFU       LOS: 13 days A FACE TO FACE EVALUATION WAS PERFORMED  Amrutha Avera P Alamin Mccuiston 01/08/2021, 11:13 AM

## 2021-01-08 NOTE — Plan of Care (Signed)
  Problem: Consults Goal: RH STROKE PATIENT EDUCATION Description: See Patient Education module for education specifics  Outcome: Progressing   Problem: RH BOWEL ELIMINATION Goal: RH STG MANAGE BOWEL WITH ASSISTANCE Description: STG Manage Bowel with min Assistance. Outcome: Progressing Goal: RH STG MANAGE BOWEL W/MEDICATION W/ASSISTANCE Description: STG Manage Bowel with Medication with min Assistance. Outcome: Progressing   Problem: RH BLADDER ELIMINATION Goal: RH STG MANAGE BLADDER WITH ASSISTANCE Description: STG Manage Bladder With min Assistance Outcome: Progressing Goal: RH STG MANAGE BLADDER WITH MEDICATION WITH ASSISTANCE Description: STG Manage Bladder With Medication With min Assistance. Outcome: Progressing   Problem: RH SKIN INTEGRITY Goal: RH STG MAINTAIN SKIN INTEGRITY WITH ASSISTANCE Description: STG Maintain Skin Integrity With min Assistance. Outcome: Progressing Goal: RH STG ABLE TO PERFORM INCISION/WOUND CARE W/ASSISTANCE Description: STG Able To Perform Skin Care With min Assistance. Outcome: Progressing   Problem: RH PAIN MANAGEMENT Goal: RH STG PAIN MANAGED AT OR BELOW PT'S PAIN GOAL Description: < 3 on a 0-10 pain scale. Outcome: Progressing   Problem: RH KNOWLEDGE DEFICIT Goal: RH STG INCREASE KNOWLEDGE OF HYPERTENSION Description: Patient will be able to demonstrate knowledge of HTN medications, dietary restrictions, BP parameters with educational materials and handouts provided by staff independently at discharge. Outcome: Progressing Goal: RH STG INCREASE KNOWLEDGE OF STROKE PROPHYLAXIS Description: Patient will be able to demonstrate knowledge of medications used to prevent future strokes with educational materials and handouts provided by staff independently at discharge. Outcome: Progressing   

## 2021-01-08 NOTE — Progress Notes (Signed)
Physical Therapy Session Note  Patient Details  Name: Juan Brewer MRN: [id number] Date of Birth: 1945-08-13  Today's Date: 01/08/2021 PT Individual Time: 0832-0900 PT Individual Time Calculation (min): 28 min   Short Term Goals: Week 1:  PT Short Term Goal 1 (Week 1): pt will transfer sit<>stand with LRAD and CGA PT Short Term Goal 1 - Progress (Week 1): Progressing toward goal PT Short Term Goal 2 (Week 1): pt will transfer bed<>chair with LRAD and CGA PT Short Term Goal 2 - Progress (Week 1): Progressing toward goal PT Short Term Goal 3 (Week 1): pt will ambulate 2f with LRAD and max of 1 PT Short Term Goal 3 - Progress (Week 1): Met  Skilled Therapeutic Interventions/Progress Updates:    Pt denies pain.  Initially oob in wc.  Sit to stand CGA and ambulated to therapy gym x 1279fwith CGA with min cues for attention and safety with walker proximity and stride length.  Standing trunk roration D!/D2 w/green ball for dynamic balance, dual UE task.   Repeated Sit to stand without UE assist w/overhead ball raise using bilat Ues x 10 for global strengthening, dual UE use.  Gait thru cones w/RW w/poor ability to remain safe distance and orientation within walker while naviating around cones to R side. Wide stepping L and R w/cones for target x 10 each direction. Standing on R for L step over R for int hip rotator strength.  repeated x 12.  Pt left oob in recliner w/chair alarm set and needs in reach.   Therapy Documentation Precautions:  Precautions Precautions: Fall Precaution Comments: R hemi, mild R inattention Restrictions Weight Bearing Restrictions: No     Therapy/Group: Individual Therapy BaCallie FieldingPTSelden/14/2022, 12:40 PM

## 2021-01-08 NOTE — Patient Care Conference (Signed)
Inpatient RehabilitationTeam Conference and Plan of Care Update Date: 01/08/2021   Time: 1:13 PM    Patient Name: Juan Brewer      Medical Record Number: [id number]  Date of Birth: 1945-10-27 Sex: Male         Room/Bed: 4M02C/4M02C-01 Payor Info: Payor: MEDICARE / Plan: MEDICARE PART A AND B / Product Type: *No Product type* /    Admit Date/Time:  12/26/2020  2:32 PM  Primary Diagnosis:  CVA (cerebral vascular accident) [facility])  Hospital Problems: Principal Problem:   CVA (cerebral vascular accident) [facility])    Expected Discharge Date: Expected Discharge Date: 01/13/21  Team Members Present: Physician leading conference: Dr. Sula Soda Care Coodinator Present: Dossie Der, LCSW;Javar Eshbach Marlyne Beards, RN, BSN, CRRN Nurse Present: Kennyth Arnold, RN PT Present: Raechel Chute, PT OT Present: Rosalio Loud, OT SLP Present: Colin Benton, SLP PPS Coordinator present : Edson Snowball, PT     Current Status/Progress Goal Weekly Team Focus  Bowel/Bladder   Patient continent B/B LBM 6/11  Patient will remain continent x2.  keep urinal at bedside for quick access   Swallow/Nutrition/ Hydration             ADL's   Supervision with min cueing for bathing, supervision UB dressing, Min A for LB dressing, CGA sit > stand. Pt experiences decreased safety awareness and motor planning, frequently undershoots furniture when transferring.  Supervision  ADL retraining, dynamic standing balance, safety awareness, postural control   Mobility   bed mobility supervision, transfers without AD min A and with RW CGA/min A, gait 179ft with RW CGA, 4 steps 2 rails min A  supervision WC level transfers, CGA sit<>stands, and min A gait and steps, supervision WC mobility  functional mobility/transfers, generalized strengthening, dynamic sitting/standing balance/coordination, NMR, gait training, and improved endurance to activity.   Communication             Safety/Cognition/ Behavioral Observations  min A  for mildly complex ps  supervision basic, minA complex (reasoning, problem solving, awareness)  simulated functional tasks, error awareness, attention to details   Pain   Denies pain  Patient will have a pain level of <=2.  Assess pain q shift and PRN   Skin   Skin CDI  Patients skin will remain intact and free from infection.  Assess skin q shift and PRN     Discharge Planning:  Wife to be here tomorrow for education tomorrow, to learn husband's care in prepartion for DC. Aware of 24/7 care at DC   Team Discussion: Doing well medically, BP stable, Cr better. Continent B/B, cyst to left elbow to be managed in Outpatient setting.  Patient on target to meet rehab goals: yes, supervision upper body dressing, min assist lower body dressing, contact guard STS. Under estimates furniture and object placement when transfers and moves away from the RW too soon. Supervision for bed mobility, transfers without assistive device at min assist. Contact guard to min assist with RW for gait of 100 ft. Supervision for basic problem solving, min assist for complex problem solving.  *See Care Plan and progress notes for long and short-term goals.   Revisions to Treatment Plan:  Monitor constipation, Cr, bradycardia.  Teaching Needs: Family education, medication management, skin/wound care, transfer training, gait training, balance training, endurance training, safety awareness.  Current Barriers to Discharge: Decreased caregiver support, Medical stability, Home enviroment access/layout, Wound care, Lack of/limited family support, Weight, Medication compliance, and Behavior  Possible Resolutions to Barriers: Continue current medications, provide  emotional support.     Medical Summary Current Status: bradycardia resolved with decrease in atenolol, constipation resolved with increase in bowel regimen, BP stable, continued on home tenormin  Barriers to Discharge: Medical stability  Barriers to Discharge  Comments: bradycardia, constipation, HTN, CKD, low back pain Possible Resolutions to [employer]: continue lower dose atenolol, tenormin, continue bowel regimen, continue to encourage hydration and monirot Cr outpatient, continue lidocaine patch for lower back   Continued Need for Acute Rehabilitation Level of Care: The patient requires daily medical management by a physician with specialized training in physical medicine and rehabilitation for the following reasons: Direction of a multidisciplinary physical rehabilitation program to maximize functional independence : Yes Medical management of patient stability for increased activity during participation in an intensive rehabilitation regime.: Yes Analysis of laboratory values and/or radiology reports with any subsequent need for medication adjustment and/or medical intervention. : Yes   I attest that I was present, lead the team conference, and concur with the assessment and plan of the team.   Tennis Must 01/08/2021, 2:07 PM

## 2021-01-08 NOTE — Progress Notes (Signed)
Occupational Therapy Session Note  Patient Details  Name: Juan Brewer MRN: [id number] Date of Birth: July 26, 1946  Today's Date: 01/08/2021 OT Individual Time: 2703-5009 OT Individual Time Calculation (min): 55 min    Short Term Goals: Week 2:  OT Short Term Goal 1 (Week 2): STG = LTG due to LOS  Skilled Therapeutic Interventions/Progress Updates:    Patient seated in recliner and ready for therapy session.  He denies pain or need for adl tasks at this time.  Sit to stand and ambulation with RW to/from recliner, arm chair and mat table in therapy gym and return to bed with CS/CGA.  Completed standing balance activities with CGA - more difficulty with stepping and dynamic activity.  Standing ergometer x 5 minutes.  Dynamic reaching and moving objects from low to various height surfaces requiring stepping to right and left - LOB to right initially but improved with cues and strategies.  He returned to bed at close of session with CS.  Bed alarm set and call bell in reach.    Therapy Documentation Precautions:  Precautions Precautions: Fall Precaution Comments: R hemi, mild R inattention Restrictions Weight Bearing Restrictions: No   Therapy/Group: Individual Therapy  Barrie Lyme 01/08/2021, 7:31 AM

## 2021-01-08 NOTE — Progress Notes (Signed)
Physical Therapy Session Note  Patient Details  Name: Juan Brewer MRN: [id number] Date of Birth: 04-May-1946  Today's Date: 01/08/2021 PT Individual Time: 1430-1526 PT Individual Time Calculation (min): 56 min   Short Term Goals: Week 1:  PT Short Term Goal 1 (Week 1): pt will transfer sit<>stand with LRAD and CGA PT Short Term Goal 1 - Progress (Week 1): Progressing toward goal PT Short Term Goal 2 (Week 1): pt will transfer bed<>chair with LRAD and CGA PT Short Term Goal 2 - Progress (Week 1): Progressing toward goal PT Short Term Goal 3 (Week 1): pt will ambulate 58f with LRAD and max of 1 PT Short Term Goal 3 - Progress (Week 1): Met Week 2:  PT Short Term Goal 1 (Week 2): pt will transfer sit<>stand with LRAD and CGA PT Short Term Goal 2 (Week 2): pt will transfer bed<>chair with LRAD and CGA PT Short Term Goal 3 (Week 2): pt will perform simulated car transfer with LRAD and min A  Skilled Therapeutic Interventions/Progress Updates:   Received pt supine in bed, pt agreeable to PT treatment, and denied any pain during session. Session with emphasis on functional mobility/transfers, generalized strengthening, dynamic standing balance/coordination, gait training, and improved activity tolerance. Supine<>sitting EOB with supervision and donned shoes with mod A for time management purposes. Bed<>WC stand<>pivot with min A and pt requested to brush teeth. [facility]. [facility]. Pt stepped on/off LiteGait Treadmill with min A and donned LiteGait harness standing with max A. Worked on gait training for the following time frames: -Trial 1: 4 minutes and 30 seconds for 2427fat 0.74m50mwith BUE support -cues to avoid "scuffing feet", for increased step/stride length, and for upright posture and bilateral knee extension. -Trial 2: 5 minutes for [id number] 0.5mph74mcreasing to 0.6mph 45mh BUE support  -challenged pt with stepping over beanbags with mild LOB and unsteadiness Pt [facility] and transferred WC<>recliner stand<>pivot with CGA. Concluded session with pt sitting in recliner, needs within reach, and seatbelt alarm on.   Therapy Documentation Precautions:  Precautions Precautions: Fall Precaution Comments: R hemi, mild R inattention Restrictions Weight Bearing Restrictions: No  Therapy/Group: Individual Therapy Carrianne Hyun MAlfonse AlpersPT   01/08/2021, 7:33 AM

## 2021-01-08 NOTE — Progress Notes (Signed)
Occupational Therapy Session Note  Patient Details  Name: Juan Brewer MRN: [id number] Date of Birth: May 10, 1946  Today's Date: 01/08/2021 OT Individual Time: 4034-7425 OT Individual Time Calculation (min): 56 min    Short Term Goals: Week 2:  OT Short Term Goal 1 (Week 2): STG = LTG due to LOS  Skilled Therapeutic Interventions/Progress Updates:    Treatment session with focus on self-care retraining, dynamic sitting and standing balance, and balance reactions.  Pt received semi-reclined in bed expressing desire to shower.  Pt completed bed mobility with supervision, with heavy reliance on bed rails.  Pt completed sit > stand with initial LOB resulting in quickly sitting back down to bed.  Pt then completed sit > stand to RW with min assist to facilitate anterior weight shift.  Pt ambulated to toilet with RW and min cues for sequencing to ensure proper positioning to sit on BSC as pt frequently attempting to sit prematurely.  Pt demonstrating R lean in standing during hygiene, requiring CGA to min assist for standing balance.  Pt completed bathing in room shower with min cues for thoroughness, pt demonstrating improved thoroughness with bathing when set up with washcloth and soap.  Pt completed dressing seated EOB with posterior lean and CGA for sitting balance due to posterior lean.  CGA when standing to pull pants over hips. Therapist educated pt on use of plastic piece to assist with donning TEDS, pt then able to complete with initial cues.  Pt completed oral care in standing at sink with close supervision.  Pt with LOB when ambulating with RW back to w/c, requiring min assist to correct.  Engaged in dynamic standing balance with focus on reaching outside BOS and crossing midline while reaching for resistive clothespins to challenge weight shifting and balance reactions.  Pt continues to demonstrate R lean in standing, but overall much improved standing balance and tolerance with no knee buckling  this session.  Incorporated memory and sequencing challenge with replicating verbal pattern.  Pt ambulated 100' with RW with min-CGA and then remained upright in w/c with seat belt alarm on and all needs in reach awaiting next therapy session.  Therapy Documentation Precautions:  Precautions Precautions: Fall Precaution Comments: R hemi, mild R inattention Restrictions Weight Bearing Restrictions: No  Pain: Pain Assessment Pain Scale: 0-10 Pain Score: 0-No pain    Therapy/Group: Individual Therapy  Rosalio Loud 01/08/2021, 9:43 AM

## 2021-01-09 DIAGNOSIS — K5900 Constipation, unspecified: Secondary | ICD-10-CM

## 2021-01-09 DIAGNOSIS — N189 Chronic kidney disease, unspecified: Secondary | ICD-10-CM

## 2021-01-09 NOTE — Discharge Instructions (Addendum)
Inpatient Rehab Discharge Instructions  Lyndall Windt Discharge date and time: 01/12/21   Activities/Precautions/ Functional Status: Activity: no lifting, driving, or strenuous exercise for till cleared by MD Diet: cardiac diet Wound Care: none needed   Functional status:  ___ No restrictions     ___ Walk up steps independently _X__ 24/7 supervision/assistance   ___ Walk up steps with assistance ___ Intermittent supervision/assistance  ___ Bathe/dress independently ___ Walk with walker     ___ Bathe/dress with assistance ___ Walk Independently    ___ Shower independently ___ Walk with assistance    _X__ Shower with assistance _X__ No alcohol     ___ Return to work/school ________   Special Instructions:    COMMUNITY REFERRALS UPON DISCHARGE:    Outpatient: PT, OT, SP             Agency:[facility] [phone]              Appointment Date/Time:WILL CONTACT WIFE TO ARRANGE APPOINTMENTS  Medical Equipment/Items Ordered:ROLLING WALKER, 3 IN 1 AND TUB BENCH                                                 Agency/Supplier: [facility]  [phone]    My questions have been answered and I understand these instructions. I will adhere to these goals and the provided educational materials after my discharge from the hospital.  Patient/Caregiver Signature _______________________________ Date __________  Clinician Signature _______________________________________ Date __________  Please bring this form and your medication list with you to all your follow-up doctor's appointments.

## 2021-01-09 NOTE — Plan of Care (Signed)
  Problem: RH Eating Goal: LTG Patient will perform eating w/assist, cues/equip (OT) Description: LTG: Patient will perform eating with assist, with/without cues using equipment (OT) Flowsheets (Taken 01/09/2021 0944) LTG: Pt will perform eating with assistance level of: (downgraded) Set up assist  Note: Downgraded due to RUE weakness and requiring assist to open containers

## 2021-01-09 NOTE — Progress Notes (Signed)
Physical Therapy Session Note  Patient Details  Name: Juan Brewer MRN: [id number] Date of Birth: 1945-09-26  Today's Date: 01/09/2021 PT Individual Time: 1305-1406 PT Individual Time Calculation (min): 61 min   Short Term Goals: Week 2:  PT Short Term Goal 1 (Week 2): pt will transfer sit<>stand with LRAD and CGA PT Short Term Goal 2 (Week 2): pt will transfer bed<>chair with LRAD and CGA PT Short Term Goal 3 (Week 2): pt will perform simulated car transfer with LRAD and min A  Skilled Therapeutic Interventions/Progress Updates:    Pt received supine in bed and agreeable to therapy session. Supine>sitting R EOB, HOB flat and not using bedrail, supervision with increased time/effort. Sitting EOB with supervision donned tennis shoes without assist - while donning R shoe in figure-4 positioning pt noted to have repeated R posterior trunk LOB requiring R UE support to maintain upright. Sit<>stands using RW with CGA for safety during session - requires intermittent cuing to recall not to pull up on RW with both hands. Gait training ~151ft to main therapy gym using RW with CGA progressing towards min assist as pt with progressively shorter, shuffled steps causing anterior lean/LOB (R more impaired than L) - throughout demos slight increased trunk/hip flexion with anteriorly displaced COM (center of mass) over his BOS (base of support), cuing for improvement.  Dynamic gait training in // bars using agility ladder:  - forward walking with BUE support progressed to no UE support targeting increased upright posture and longer step lengths, requires min assist for balance initially without UE support progressing to lighter min assist/CGA - side stepping with B UE support progressed to only R UE support with CGA/min assist for balance - increased difficulty stepping towards R, cuing to maintain upright posture and for R hand attention to slide it along the bar with him   Dynamic standing balance task  using 1kg weighted ball to perform R/L lateral ball taps to lower external surfaces forcing a squat position with R/L weight shift then coming to full upright to place ball through basketball hoop - min assist for balance.  Stair navigation training focusing on normalizing movement for R LE NMR via reciprocal stepping pattern using B HRs - navigated 8 steps with CGA/min assist for balance/safety and cuing for sequencing. Educated pt on focus of this task on R LE NMR and to perform stair navigation at home with his wife how his primary PT, Tobi Bastos, educated him on during family training.  Gait training ~185ft back to room using RW with CGA progressing towards light min assist at end again due to starting a shuffled gait pattern causing anterior trunk lean.  Sit>supine supervision. Pt left supine in bed with needs in reach and bed alarm on.   Therapy Documentation Precautions:  Precautions Precautions: Fall Precaution Comments: R hemi, mild R inattention Restrictions Weight Bearing Restrictions: No   Pain: Reports some "soreness" in his low back due to hx of surgeries - provided seated rest breaks for pain management.  Therapy/Group: Individual Therapy  Ginny Forth , PT, DPT, CSRS 01/09/2021, 12:29 PM

## 2021-01-09 NOTE — Progress Notes (Signed)
Physical Therapy Session Note  Patient Details  Name: Juan Brewer MRN: [id number] Date of Birth: April 05, 1946  Today's Date: 01/09/2021 PT Individual Time: 2563-8937 PT Individual Time Calculation (min): 54 min   Short Term Goals: Week 1:  PT Short Term Goal 1 (Week 1): pt will transfer sit<>stand with LRAD and CGA PT Short Term Goal 1 - Progress (Week 1): Progressing toward goal PT Short Term Goal 2 (Week 1): pt will transfer bed<>chair with LRAD and CGA PT Short Term Goal 2 - Progress (Week 1): Progressing toward goal PT Short Term Goal 3 (Week 1): pt will ambulate 69f with LRAD and max of 1 PT Short Term Goal 3 - Progress (Week 1): Met Week 2:  PT Short Term Goal 1 (Week 2): pt will transfer sit<>stand with LRAD and CGA PT Short Term Goal 2 (Week 2): pt will transfer bed<>chair with LRAD and CGA PT Short Term Goal 3 (Week 2): pt will perform simulated car transfer with LRAD and min A  Skilled Therapeutic Interventions/Progress Updates:   [facility]. Session with emphasis on discharge planning, functional mobility/transfers, generalized strengthening, dynamic standing balance/coordination, ambulation, simulated car transfers, stair/curb navigation, and improved activity tolerance. Donned gait belt and shoes with max A for time management and [facility] for time management purposes. Encouraged use of gait belt with all functional mobility. Pt performed simulated car transfer with RW and CGA provided by pt's wife with cues to stand on pt's R side in case of LOB. [name] with concerns regarding navigating small curb to enter home from garage. Demonstrated technique for navigating a 3in curb with RW using "up with the good, down with the bad" technique. Pt then navigated 3in curb with RW and min A x 2 trials with cues for RW safety and stepping technique. Pt then navigated 4 steps with 2 rails and min  A provided by pt's wife alternating ascending and descending with a step through and step to pattern with cues for "up with the good, down with the bad" technique and for body mechanics/postioning. Pt then ambulated ~1869fwith RW and CGA/min A provided by pt's wife with cues to increase step/stride length, upright posture, and for RW safety. Discussed equipment for D/C with recommendation to use RW 24/7 for safety/stability. Pt expressed wanting a WC for community navigation; CSW notified. [name] reported having transport chair and therapist encouraged use of transport chair as it is much lighter than WC for pt to use when going to his office. [name] demonstrated understanding of WC parts management including donning/doffing legrests and armrests and locking/unlocking brakes. [facility] and requested to return to bed. Stand<>pivot WC<>bed with CGA provided by pt's wife and sit<>supine with supervision. Concluded session with pt supine in bed, needs within reach, and bed alarm on.   Therapy Documentation Precautions:  Precautions Precautions: Fall Precaution Comments: R hemi, mild R inattention Restrictions Weight Bearing Restrictions: No   Therapy/Group: Individual Therapy AnAlfonse AlpersT, DPT   01/09/2021, 7:36 AM

## 2021-01-09 NOTE — Progress Notes (Signed)
Speech Language Pathology Daily Session Note  Patient Details  Name: Juan Brewer MRN: [id number] Date of Birth: 09/24/1945  Today's Date: 01/09/2021 SLP Individual Time: 6389-3734 SLP Individual Time Calculation (min): 45 min  Short Term Goals: Week 1: SLP Short Term Goal 1 (Week 1): Patient will complete medication management simulation with minA cues for accuracy. SLP Short Term Goal 2 (Week 1): Patient will complete mild complex level money/financial management tasks with minA cues. SLP Short Term Goal 3 (Week 1): Patient will perform alternating attention functional tasks with modA cues for attention to and attempts at correction of errors. SLP Short Term Goal 4 (Week 1): Patient will recall specific therapeutic interventions on a daily basis with supervisionA. SLP Short Term Goal 5 (Week 1): Patient will demonstrate anticipatory awareness for impact of his deficits on function, with minA cues.  Skilled Therapeutic Interventions:Skilled ST services focused on education and cognitive skills. [name] was present for education. SLP facilitated mildly complex to complex problem solving, error awareness and recall skills in account balancing tasks. Pt demonstrated x2 errors on mildly complex task with ability to correct and as the task increase pt requiring increase in cuing to reduce impulsivity, check for errors and keep organize during task. Pt required overall mod A verbal cues for error awareness fading to min A verbal cues towards the end of the task and eventually demonstrated better carryover of recall/organization strategies within task when using relatively novel calculator to check for errors. SLP provided education to pt and pt's wife pertaining to impulsivity, willingness to accept acute deficits in order to gain better control over managing errors and increase problem solving skills. [name] agreed to assist with complex task and pt reluctantly agreed, although still appears pt  does not fully grasp impact of acute deficits. Pt was left in room with call bell within reach and bed alarm set. SLP recommends to continue skilled services.      Pain Pain Assessment Pain Scale: 0-10 Pain Score: 0-No pain  Therapy/Group: Individual Therapy  Juan Brewer  [facility] 01/09/2021, 9:59 AM

## 2021-01-09 NOTE — Progress Notes (Signed)
PROGRESS NOTE   Subjective/Complaints: Patient's chart reviewed- No issues reported overnight Vitals signs stable  No complaints He is on commode.    ROS:   Pt denies SOB, abd pain, CP, N/V/C/D, and vision changes, constipation  Objective:   No results found. Recent Labs    01/07/21 0625  WBC 5.8  HGB 14.1  HCT 41.2  PLT 107*    Recent Labs    01/07/21 0625  NA 139  K 3.9  CL 105  CO2 28  GLUCOSE 111*  BUN 21  CREATININE 1.39*  CALCIUM 9.9     Intake/Output Summary (Last 24 hours) at 01/09/2021 0833 Last data filed at 01/09/2021 0728 Gross per 24 hour  Intake 560 ml  Output 525 ml  Net 35 ml        Physical Exam: Vital Signs Blood pressure 115/77, pulse 60, temperature 98.1 F (36.7 C), resp. rate 18, height 5\' 11"  (1.803 m), weight 83 kg, SpO2 90 %. Gen: no distress, normal appearing HEENT: oral mucosa pink and moist, NCAT Cardio: Reg rate Chest: normal effort, normal rate of breathing Abd: soft, non-distended Ext: no edema Psych: pleasant, normal affect Musculoskeletal:     Cervical back: Normal range of motion and neck supple.     Comments: Left elbow boggy olecranon bursa still swollen Skin:    General: Skin is warm and dry.  Neurological:     Mental Status: He is alert.     Comments: Alert Sl HOH Motor: LUE/LLE: 5/5 proximal distal RUE: 4 to 4+/5 proximal and distal with mild ataxia RLE: 4+/5 prox to distal   Assessment/Plan: 1. Functional deficits which require 3+ hours per day of interdisciplinary therapy in a comprehensive inpatient rehab setting. Physiatrist is providing close team supervision and 24 hour management of active medical problems listed below. Physiatrist and rehab team continue to assess barriers to discharge/monitor patient progress toward functional and medical goals  Care Tool:  Bathing    Body parts bathed by patient: Right arm, Left arm, Chest, Abdomen,  Front perineal area, Right upper leg, Left upper leg, Right lower leg, Face, Left lower leg, Buttocks   Body parts bathed by helper: Left lower leg, Right lower leg     Bathing assist Assist Level: Supervision/Verbal cueing     Upper Body Dressing/Undressing Upper body dressing   What is the patient wearing?: Pull over shirt    Upper body assist Assist Level: Supervision/Verbal cueing    Lower Body Dressing/Undressing Lower body dressing      What is the patient wearing?: Underwear/pull up, Pants     Lower body assist Assist for lower body dressing: Contact Guard/Touching assist     Toileting Toileting    Toileting assist Assist for toileting: Contact Guard/Touching assist     Transfers Chair/bed transfer  Transfers assist     Chair/bed transfer assist level: Contact Guard/Touching assist     Locomotion Ambulation   Ambulation assist      Assist level: Minimal Assistance - Patient > 75% Assistive device: Walker-rolling Max distance: 130ft   Walk 10 feet activity   Assist  Walk 10 feet activity did not occur: Safety/medical concerns (fatigue, decreased balance,  R hemi, decreased motor planning/coordination)  Assist level: Contact Guard/Touching assist Assistive device: Walker-rolling   Walk 50 feet activity   Assist Walk 50 feet with 2 turns activity did not occur: Safety/medical concerns (fatigue, decreased balance, R hemi, decreased motor planning/coordination)  Assist level: Contact Guard/Touching assist Assistive device: Walker-rolling    Walk 150 feet activity   Assist Walk 150 feet activity did not occur: Safety/medical concerns (fatigue, decreased balance, R hemi, decreased motor planning/coordination)  Assist level: Contact Guard/Touching assist Assistive device: Walker-rolling    Walk 10 feet on uneven surface  activity   Assist Walk 10 feet on uneven surfaces activity did not occur: Safety/medical concerns          Wheelchair     Assist Will patient use wheelchair at discharge?: Yes Type of Wheelchair: Manual    Wheelchair assist level: Supervision/Verbal cueing Max wheelchair distance: 23ft    Wheelchair 50 feet with 2 turns activity    Assist        Assist Level: Supervision/Verbal cueing   Wheelchair 150 feet activity     Assist      Assist Level: Maximal Assistance - Patient 25 - 49%   Blood pressure 115/77, pulse 60, temperature 98.1 F (36.7 C), resp. rate 18, height 5\' 11"  (1.803 m), weight 83 kg, SpO2 90 %.  Medical Problem List and Plan: 1. 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               -patient may shower             -ELOS/Goals: 14 to 17 days/min a         Continue PT and OT- CIR 2.  Impaired mobility: -DVT/anticoagulation:  SCDs/TEDs--d/ced Lovenox due to low platelets. PC 83 on 6/6.              -antiplatelet therapy: continue ASA daily.  3. Pain Management: Tylenol prn. Continue lidocaine patch for low back pain. Vitamin D level 49. Start 1,000U daily supplement.  4. Mood: LCSW to follow for evaluation and support.              -antipsychotic agents: N/A  5. Neuropsych: This patient is capable of making decisions on his own behalf. 6. Skin/Wound Care: Routine pressure relief measures.  7. Fluids/Electrolytes/Nutrition: Monitor I/Os.            encourage PO, good appetite  6/13 I personally reviewed the patient's labs 8.  HTN: continue to monitor blood pressures TID--continue tenormin daily.            Vitals:   01/09/21 0618 01/09/21 0758  BP: 113/73 115/77  Pulse: 63 60  Resp: 18   Temp: 98.1 F (36.7 C)   SpO2: 90%    9.  Thrombocytopenia: 6/6 platelets remain in 80-90k range, no bleeding   -continue monitor serially 10.  Hyperglycemia: Hemoglobin A1c 5.5 and likely stress related. 11.  COPD: Stable on  Anoro 12.  Dyslipidemia:.             --on Lovaza and Zocor 13.  AKI on CKD?: Serum creatinine 1.5 at admission. Continue to monitor.               Placed nursing order to encourage 6-8 glasses of water per day  6/13 labs stable, monitor outpatient 14.  Left elbow cystic lesion: likely olecranon bursitis.  Outpatient f/[facility] with [facility] 15. Anxiety/depresion: Continues on home regimen of Prozac 40 mg, Xanax 0.5 mg qid and Triazolam 0.25 mg/hs for insomnia.   16. Constipation:  Colace with supper. Added prune juice with meals. Mag citrate after therapy on 6/5.   -continue senna-s at bedtime with miralax in the am. 17. Tobacco abuse: continue nicotine patch 18. Bra6/14- bradycardia better- 60-68 in last 24 hours-continue regimen 19. Disposition: HFU scheduled with me [id number] 11:40AM HFU. Discharge moved up to Saturday. Meds to be sent to [facility] with request to be delivered to room on Friday. Discussed with team.        LOS: 14 days A FACE TO FACE EVALUATION WAS PERFORMED  Juan Brewer 01/09/2021, 8:33 AM

## 2021-01-09 NOTE — Progress Notes (Signed)
Occupational Therapy Session Note  Patient Details  Name: Juan Brewer MRN: [id number] Date of Birth: June 12, 1946  Today's Date: 01/09/2021 OT Individual Time: 8469-6295 OT Individual Time Calculation (min): 42 min    Short Term Goals: Week 2:  OT Short Term Goal 1 (Week 2): STG = LTG due to LOS  Skilled Therapeutic Interventions/Progress Updates:   Met pt lying supine in bed, pt's wife present, pt agreed to session. Treatment session was focused on family education on functional transfers, equipment recommendations and ADL cues. [name] expressed concerns with bathroom transfers and bathroom ambulation. Pt was supervision for bed mobility primarily using bed rails to bring UB upright. Pt was brought to rehab apartment and ambulated using RW to bathroom with CGA from wife. Pt's home bathroom has a shower chair, OT recommended a shower bench due to pt's poor safety awareness. OTS verbalized sit > stand transfer from RW to shower bench, pt demonstrated safe transfer. Pt required cues for bringing legs over shower ledge safely. Pt with decreased safety awareness and impulsivity with shower bench transfer, pt's wife was receptive for cueing.[name] was educated on cues to provide when completing sitting transfers. Pt ambulated with RW to recliner and flopped into seat, OTS encouraged pt to retry transfer safely with good carryover. [name] reported home bathroom is tight, pt and wife ambulated to 3-in-1 commode with surrounding AE being used as a barrier. Pt required safety cues to bring walker in front of him when sitting down, with good carryover. Pt and wife expressed both liking shower bench when transferring into shower. OT adjusted shower bench, requiring pt to enter to the R to simulate home environment similarity. Pt having difficulties with bringing RLE over ledge, but was able to problem-solve and bring both feet into the tub. OT and OTS educated pt's wife on pt's current functional status  for bathing and grooming. Pt was brought back to room, left in w/c, handover to PT.   OT educated pt's wife on body placement during transfers and mobility to increase pt and wife safety. [name] verbalized understanding all education provided.   Therapy Documentation Precautions:  Precautions Precautions: Fall Precaution Comments: R hemi, mild R inattention Restrictions Weight Bearing Restrictions: No   Vital Signs: Therapy Vitals Pulse Rate: 60 BP: 115/77  Pain: Pain Assessment Pain Scale: 0-10 Pain Score: 0  Therapy/Group: Individual Therapy  [name] 01/09/2021, 11:37 AM

## 2021-01-09 NOTE — Progress Notes (Signed)
Patient ID: Juan Brewer, male   DOB: 19-Dec-1945, 75 y.o.   MRN: [id number] Spoke with wife to see how family education went today. She reports he is doing better than she thought he was and can see the progress he has made while here. Discussed discharge changed to 6/18 instead of Sunday 6/19. She will look t see what equipment her Mom has and get back with worker regarding what is needed. Wants OP to be at [facility] due to closer to them. Will work on discharge needs for Sat.

## 2021-01-09 NOTE — Plan of Care (Signed)
  Problem: RH Balance Goal: LTG Patient will maintain dynamic standing balance (PT) Description: LTG:  Patient will maintain dynamic standing balance with assistance during mobility activities (PT) Flowsheets (Taken 01/09/2021 0750) LTG: Pt will maintain dynamic standing balance during mobility activities with:: (upgraded due to improved balance, coordination, and awareness) Supervision/Verbal cueing Note: upgraded due to improved balance, coordination, and awareness   Problem: Sit to Stand Goal: LTG:  Patient will perform sit to stand with assistance level (PT) Description: LTG:  Patient will perform sit to stand with assistance level (PT) Flowsheets (Taken 01/09/2021 0750) LTG: PT will perform sit to stand in preparation for functional mobility with assistance level: (upgraded due to improved balance, coordination, and awareness) Supervision/Verbal cueing Note: upgraded due to improved balance, coordination, and awareness   Problem: RH Ambulation Goal: LTG Patient will ambulate in controlled environment (PT) Description: LTG: Patient will ambulate in a controlled environment, # of feet with assistance (PT). Flowsheets (Taken 01/09/2021 0750) LTG: Pt will ambulate in controlled environ  assist needed:: (upgraded due to improved balance, coordination, and awareness) Supervision/Verbal cueing LTG: Ambulation distance in controlled environment: 131ft with LRAD Note: upgraded due to improved balance, coordination, and awareness Goal: LTG Patient will ambulate in home environment (PT) Description: LTG: Patient will ambulate in home environment, # of feet with assistance (PT). Flowsheets (Taken 01/09/2021 0750) LTG: Pt will ambulate in home environ  assist needed:: (upgraded due to improved balance, coordination, and awareness) Supervision/Verbal cueing LTG: Ambulation distance in home environment: 75ft with LRAD Note: upgraded due to improved balance, coordination, and awareness

## 2021-01-09 NOTE — Discharge Summary (Signed)
Physician Discharge Summary  Patient ID: Juan Brewer MRN: [id number] DOB/AGE: 1946-06-09 75 y.o.  Admit date: 12/26/2020 Discharge date: 01/12/2021  Discharge Diagnoses:  Principal Problem:   CVA (cerebral vascular accident) [facility]) Active Problems:   Essential hypertension   Thrombocytopenia (HCC)   Chronic kidney disease   Constipation   Discharged Condition: good  Significant Diagnostic Studies: N/A   Labs:  Basic Metabolic Panel: BMP Latest Ref Rng & Units 01/07/2021 12/31/2020 12/27/2020  Glucose 70 - 99 mg/dL 151(V) 616(W) 737(T)  BUN 8 - 23 mg/dL 21 06(Y) 69(S)  Creatinine 0.61 - 1.24 mg/dL 8.54(O) 2.70(J) 5.00(X)  Sodium 135 - 145 mmol/L 139 138 136  Potassium 3.5 - 5.1 mmol/L 3.9 4.1 3.6  Chloride 98 - 111 mmol/L 105 107 103  CO2 22 - 32 mmol/L 28 26 25   Calcium 8.9 - 10.3 mg/dL 9.9 9.8 9.8      CBC: CBC Latest Ref Rng & Units 01/07/2021 12/31/2020 12/27/2020  WBC 4.0 - 10.5 K/uL 5.8 6.8 7.1  Hemoglobin 13.0 - 17.0 g/dL 02/26/2021 38.1 82.9  Hematocrit 39.0 - 52.0 % 41.2 40.8 40.4  Platelets 150 - 400 K/uL 107(L) 83(L) 92(L)      CBG: No results for input(s): GLUCAP in the last 168 hours.  Brief HPI:   Juan Brewer is a 75 y.o. male with history of HTN, COPD, syncope with TIA event 01/22 was admitted on 12/16/2020 with right facial droop and difficulty walking.  UDS positive for benzos.  CTA was negative for LVO and showed mild to moderate mixed left carotid bulb atherosclerosis.  MRI brain showed left lentiform nucleus/corona radiata infarct and moderate chronic microvascular ischemic disease.  2D echo showed EF of 60 to 65%.  Patient had been on aspirin every other day with daily Plavix but this was recently discontinued due to cystic lesion versus hemarthrosis left elbow.    He was found to have thrombocytopenia at admission with platelets at 96.  Dr. 12/18/2020 felt that stroke was due to small vessel disease and due to low platelets as well as bleeding risks conservative  management with low dose ASA as well as close follow-up with PCP after discharge recommended.   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  CIR was recommended due to functional decline   Hospital Course: Ilya Neely was admitted to rehab 12/26/2020 for inpatient therapies to consist of PT, ST and OT at least three hours five days a week. Past admission physiatrist, therapy team and rehab RN have worked together to provide customized collaborative inpatient rehab. His blood pressures were monitored on TID basis and has been stable off Cozaar. Atenolol was decreased to 12.5 mg daily due to bradycardia. His respiratory status is stable on Anoro and no symptoms noted with increase inactivity. Nicotine patch was added to help with nicotine withdrawal symptoms.  Impaired fasting glucose noted and felt to be due to stress as Hgb A1c WNL at 5.5.  Acute on chronic renal failure has improved with serial check of lytes showed that renal status is at baseline with SCr in 1.3 range and BUN is WNL.   His po intake has been good and he is continent of B/B. He continues on low dose ASA daily for secondary stroke prevention and  follow up CBC showed H/H is stable with slow improvement in thrombocytopenia. Platelets up to 107 on most recent check.   Left elbow effusion likely  due to bursitis has resolved and no pain or erythema noted.  Low back pain has improved with use of lidocaine patches as at home. Bowel program has been augmented to help  manage constipation. Speech therapy was consulted due to cognitive impairments which were noted during therapy. He has made steady gains and Supervision is recommended with cognitive tasks as well as with mobility. He will continue to receive outpatient PT, OT and ST at [facility] after discharge.    Rehab course: During patient's stay in rehab team conferences  were held to monitor patient's progress, set goals  and discuss barriers to discharge. At admission, patient required mod assist with mobility and min assist with ADL tasks.  Cognitive evaluation was done revealing mild impairment in memory and moderate cognitive impairments affecting attention as well as executive functions.  He has had improvement in activity tolerance, balance, postural control as well as ability to compensate for deficits.  He is able to complete ADL tasks with supervision. He requires contact-guard to min assist with mobility due to tendency of right knee to buckle.  He requires min to mod cues for posture as well as safety.  He requires min supervision to min min assist with cognitive tasks however requires min to mod cues at times for attention and anticipatory awareness.  Family education was completed with wife.  Disposition: Home  Diet: Heart Healthy.   Special Instructions: Continue to monitor platelets with serial CBC checks. Monitor for signs of bleeding. No driving or return to work till cleared by MD.   Allergies as of 01/12/2021   No Known Allergies      Medication List     STOP taking these medications    losartan 100 MG tablet Commonly known as: COZAAR       TAKE these medications    acetaminophen 325 MG tablet Commonly known as: TYLENOL Take 3 tablets (975 mg total) by mouth 2 (two) times daily.   ALPRAZolam 0.5 MG tablet Commonly known as: XANAX Take 0.5 mg by mouth 4 (four) times daily.   Anoro Ellipta 62.5-25 MCG/INH Aepb Generic drug: umeclidinium-vilanterol Inhale 1 puff into the lungs daily.   aspirin 81 MG EC tablet Take 81 mg by mouth daily.   atenolol 25 MG tablet Commonly known as: TENORMIN Take 0.5 tablets (12.5 mg total) by mouth daily. NOTE DECREASE IN DOSE What changed:  how much to take additional instructions   docusate sodium 100 MG capsule Commonly known as: COLACE Take 1 capsule (100 mg total) by mouth daily.   FISH OIL PO Take 1 capsule by mouth daily.    FLUoxetine 40 MG capsule Commonly known as: PROZAC Take 40 mg by mouth daily.   lidocaine 5 % Commonly known as: LIDODERM Place 1 patch onto the skin daily. Apply at lower back a 8 pm and remove at 8 am  daily. You can purchase them over the counter.   nicotine 14 mg/24hr patch Commonly known as: NICODERM CQ - dosed in mg/24 hours Place 1 patch (14 mg total) onto the skin daily.   Senexon-S 8.6-50 MG tablet Generic drug: senna-docusate Take 2 tablets by mouth at bedtime. Notes to patient: For constipation   simvastatin 40 MG tablet Commonly known as: ZOCOR Take 40 mg by mouth at bedtime.   SM Arthricream Rub 10 % cream Generic drug: trolamine salicylate Apply 1 application topically 3 (three) times daily. Notes to patient: Can resume at home   traZODone 50 MG tablet Commonly known as:  DESYREL Take 50 mg by mouth at bedtime. Notes to patient: For insomnia   triazolam 0.25 MG tablet Commonly known as: HALCION Take 0.25 mg by mouth at bedtime.   VITAMIN B12 PO Take 1 tablet by mouth daily.   VITAMIN D3 PO Take 1 tablet by mouth daily.        Follow-up Information     Raulkar, Drema Pry, MD Follow up.   Specialty: Physical Medicine and Rehabilitation Why: 03/26/21 11:40AM Contact information: [address] [place] [address] [phone]         [facility]. Call on 01/14/2021.   Why: for post stroke follow up OR can follow up with neurology in [place] Contact information: [address]     [address] [place] [address] [phone]        [facility]. Call on 01/14/2021.   Why: for post hospital follow up Contact information: [address] [place] [address] [phone]                 Signed: Jacquelynn Cree 01/14/2021, 10:28 AM

## 2021-01-10 ENCOUNTER — Other Ambulatory Visit (HOSPITAL_COMMUNITY): Payer: Self-pay

## 2021-01-10 MED ORDER — NICOTINE 14 MG/24HR TD PT24
14.0000 mg | MEDICATED_PATCH | Freq: Every day | TRANSDERMAL | 0 refills | Status: DC
  Filled 2021-01-10 (×2): qty 28, 28d supply, fill #0

## 2021-01-10 MED ORDER — SENNOSIDES-DOCUSATE SODIUM 8.6-50 MG PO TABS
2.0000 | ORAL_TABLET | Freq: Every day | ORAL | 0 refills | Status: DC
  Filled 2021-01-10 (×2): qty 60, 30d supply, fill #0

## 2021-01-10 MED ORDER — SM ARTHRICREAM RUB 10 % EX CREA
1.0000 "application " | TOPICAL_CREAM | Freq: Three times a day (TID) | CUTANEOUS | 0 refills | Status: DC
  Filled 2021-01-10: qty 85, 28d supply, fill #0
  Filled 2021-01-10: qty 85, 29d supply, fill #0

## 2021-01-10 MED ORDER — LIDOCAINE 5 % EX PTCH: 1.0000 | MEDICATED_PATCH | CUTANEOUS | 0 refills | Status: DC

## 2021-01-10 MED ORDER — LIDOCAINE 5 % EX PTCH
1.0000 | MEDICATED_PATCH | CUTANEOUS | 0 refills | Status: DC
  Filled 2021-01-10 (×2): qty 30, 30d supply, fill #0

## 2021-01-10 MED ORDER — ATENOLOL 25 MG PO TABS: 12.5000 mg | ORAL_TABLET | Freq: Every day | ORAL | Status: DC

## 2021-01-10 MED ORDER — DOCUSATE SODIUM 100 MG PO CAPS
100.0000 mg | ORAL_CAPSULE | Freq: Every day | ORAL | 0 refills | Status: DC
  Filled 2021-01-10 (×2): qty 30, 30d supply, fill #0

## 2021-01-10 NOTE — Progress Notes (Signed)
Speech Language Pathology Weekly Progress and Session Note  Patient Details  Name: Juan Brewer MRN: [id number] Date of Birth: 04/27/1946  Beginning of progress report period: January 03, 2021 End of progress report period: January 10, 2021  Today's Date: 01/10/2021 SLP Individual Time: 1345-1430 SLP Individual Time Calculation (min): 45 min  Short Term Goals: Week 1: SLP Short Term Goal 1 (Week 1): Patient will complete medication management simulation with minA cues for accuracy. SLP Short Term Goal 1 - Progress (Week 1): Met SLP Short Term Goal 2 (Week 1): Patient will complete mild complex level money/financial management tasks with minA cues. SLP Short Term Goal 2 - Progress (Week 1): Met SLP Short Term Goal 3 (Week 1): Patient will perform alternating attention functional tasks with modA cues for attention to and attempts at correction of errors. SLP Short Term Goal 3 - Progress (Week 1): Met SLP Short Term Goal 4 (Week 1): Patient will recall specific therapeutic interventions on a daily basis with supervisionA. SLP Short Term Goal 4 - Progress (Week 1): Met SLP Short Term Goal 5 (Week 1): Patient will demonstrate anticipatory awareness for impact of his deficits on function, with minA cues. SLP Short Term Goal 5 - Progress (Week 1): Progressing toward goal    New Short Term Goals: Week 2: SLP Short Term Goal 1 (Week 2): STG= LTG due to ELOS  Weekly Progress Updates: Pt has met 4/5 STG's this reporting period due to improvement in problem solving, alternating attention , and recall. He continues to require min -mod A for anticipatory awareness and safety. Pt and family education Is ongoing. He will benefit from continued skilled SLP intervention to increase awareness of deficits and awareness of errors with higher level cognitive tasks.      Intensity: Minumum of 1-2 x/day, 30 to 90 minutes Frequency: 3 to 5 out of 7 days Duration/Length of Stay: 6/18 Treatment/Interventions:  Cognitive remediation/compensation;Cueing hierarchy;Functional tasks;Patient/family education;Medication managment   Daily Session  Skilled Therapeutic Interventions: Skilled Slp intervention focused on cognition. Pt sleeping but able to wake up and participate in treatment. He answered mildly complex problem solving questions related to medications and times taken. He was able to show where medications would be placed in pill organizer with min A visual cues. He was given written handout on memory strategies and helpful aids to increase organization and recall of appointments and scheduled events once home. Cont with therapy per plan of care.      General    Pain Pain Assessment Pain Scale: Faces Faces Pain Scale: No hurt  Therapy/Group: Individual Therapy  Darrol Poke Rianne Degraaf 01/10/2021, 3:06 PM

## 2021-01-10 NOTE — Progress Notes (Signed)
Inpatient Rehabilitation Care Coordinator Discharge Note  The overall goal for the admission was met for: DC SAT 6/18  Discharge location: Yes-HOME WIFE, SON AND DAUGHTER [place]  Length of Stay: Yes-17 DAYS  Discharge activity level: Yes-CGA-MIN LEVEL  Home/community participation: Yes  Services provided included: MD, RD, PT, OT, SLP, RN, CM, Pharmacy, and SW  Financial Services: Medicare  Choices offered to/list presented to:pt and wife  Follow-up services arranged: Outpatient: [facility] -PT OT SP WILL CONTACT WIFE TO SET UP APPOINTMENTS, DME: [facility], 3 IN 1 AND TUB BENCH, and Patient/Family has no preference for HH/DME agencies PREF [facility] DUE TO CLOSE TO WHERE THEY LIVE  Comments (or additional information):WIFE WAS IN FOR EDUCATION AND IT WENT WELL, AWARE WILL NEED 24/7 CLOSE SUPERVISION AT DC. PT AT HIGH RISK TO FALL DUE TO LACK OF AWARENESS AND IMPULSIVE  Patient/Family verbalized understanding of follow-up arrangements: Yes  Individual responsible for coordination of the follow-up plan: Juan Brewer-WIFE [phone]  Confirmed correct DME delivered: Elease Hashimoto 01/10/2021    Juan Brewer, [name]

## 2021-01-10 NOTE — Progress Notes (Signed)
Physical Therapy Session Note  Patient Details  Name: Juan Brewer MRN: [id number] Date of Birth: 05-07-1946  Today's Date: 01/10/2021 PT Individual Time: 0930-1000 PT Individual Time Calculation (min): 30 min   Short Term Goals: Week 2:  PT Short Term Goal 1 (Week 2): pt will transfer sit<>stand with LRAD and CGA PT Short Term Goal 2 (Week 2): pt will transfer bed<>chair with LRAD and CGA PT Short Term Goal 3 (Week 2): pt will perform simulated car transfer with LRAD and min A  Skilled Therapeutic Interventions/Progress Updates:    Patient in supine and reports feeling well.  Wants to shower, but discussed showering with OT during next session.  Assisted to don TED knee hi stockings in supine.  Patient S for supine to sit.  Requesting to brush his teeth.  Sit to stand to RW with S, but noting R knee flexion during ambulation to sink with RW and CGA to min A for safety.  Patient standing at sink with CGA to brush teeth and needing cues for correct foot position to prevent R lateral lean/LOB.  Patient ambulated with RW to ortho gym with min A due to R knee buckling and not getting fully extended in stance.  Patient performed lumbar stretching on mat with double knee to chest x 20 sec.  Seated with feet on floor for hamstring stretch 2 x 20 sec each leg.  Sit <> stand without UE support with cues for technique with S/CGA x 5 reps.  Patient standing with ball behind R knee completed 10 reps terminal knee extension w/ 3 sec hold.  Patient ambulated to room with CGA with RW and min to mod cues for posture/proximity to walker with improved R knee control. Left seated in recliner with call bell in reach and handoff to OT.  Therapy Documentation Precautions:  Precautions Precautions: Fall Precaution Comments: R hemi, mild R inattention Restrictions Weight Bearing Restrictions: No  Pain: Pain Assessment Pain Scale: Faces Pain Score: 0-No pain Faces Pain Scale: No hurt    Therapy/Group:  Individual Therapy  [name] Sheran Lawless, PT 01/10/2021, 8:29 AM

## 2021-01-10 NOTE — Progress Notes (Signed)
Physical Therapy Session Note  Patient Details  Name: Juan Brewer MRN: [id number] Date of Birth: 05-03-1946  Today's Date: 01/10/2021 PT Individual Time: 4098-1191 PT Individual Time Calculation (min): 54 min   Short Term Goals: Week 1:  PT Short Term Goal 1 (Week 1): pt will transfer sit<>stand with LRAD and CGA PT Short Term Goal 1 - Progress (Week 1): Progressing toward goal PT Short Term Goal 2 (Week 1): pt will transfer bed<>chair with LRAD and CGA PT Short Term Goal 2 - Progress (Week 1): Progressing toward goal PT Short Term Goal 3 (Week 1): pt will ambulate 43f with LRAD and max of 1 PT Short Term Goal 3 - Progress (Week 1): Met Week 2:  PT Short Term Goal 1 (Week 2): pt will transfer sit<>stand with LRAD and CGA PT Short Term Goal 2 (Week 2): pt will transfer bed<>chair with LRAD and CGA PT Short Term Goal 3 (Week 2): pt will perform simulated car transfer with LRAD and min A  Skilled Therapeutic Interventions/Progress Updates:   Received pt supine in bed, pt agreeable to PT treatment and denied any pain during session. Session with emphasis on functional mobility/transfers, generalized strengthening, dynamic standing balance/coordination, gait training, and improved activity tolerance. Pt performed bed mobility with supervision and use of bedrails x 2 trials throughout session and donned/doffed shoes with supervision and increased time using figure four position. Pt requested to brush teeth and transferred sit<>stand with RW and CGA and ambulated to sink with RW and CGA. Pt stood and brushed teeth with CGA. Pt then ambulated to bathroom with RW and min A (due to threshold and numerous turns) and attempted to urinate while standing but ultimately unsuccessful. [facility] for time management purposes. Pt ambulated 1848fwith RW and close supervision! Required verbal cues to remain close to RW and to decrease cadence for safety. Pt then ambulated  additional 18069fith min handheld assist. Pt demonstrated narrow BOS, decreased stride length, flexed trunk, and increased bilateral knee flexion in stance but no buckling noted. Worked on dynamic standing balance and reaching outside BOS playing connect four x 2 trials with no UE support and CGA fading to mod A at end of activity due to increase in bilateral knee flexion (almost buckling) and weakness in RLE. Pt reached down to grab last piece and with increased difficulty returning to upright position. Pt reported "knowing" that his RLE was getting weak but was "determined" to finish game. Discussed importance of recognizing fatigue onset and discussed various energy conservation strategies to promote safety. Pt [facility] and ambulated additional 8ft29fth min A to bed.  Concluded session with pt supine in bed, needs within reach, and bed alarm on.   Therapy Documentation Precautions:  Precautions Precautions: Fall Precaution Comments: R hemi, mild R inattention Restrictions Weight Bearing Restrictions: No  Therapy/Group: Individual Therapy AnnaAlfonse Alpers DPT   01/10/2021, 7:39 AM

## 2021-01-10 NOTE — Progress Notes (Signed)
PROGRESS NOTE   Subjective/Complaints: Patient's chart reviewed- No issues reported overnight Vitals signs stable  Wife has been updated regarding equipment needs Discussed how to minimize chances of another stroke  ROS:   Pt denies SOB, abd pain, CP, N/V/C/D, and vision changes, constipation  Objective:   No results found. No results for input(s): WBC, HGB, HCT, PLT in the last 72 hours.   No results for input(s): NA, K, CL, CO2, GLUCOSE, BUN, CREATININE, CALCIUM in the last 72 hours.    Intake/Output Summary (Last 24 hours) at 01/10/2021 0823 Last data filed at 01/10/2021 0700 Gross per 24 hour  Intake 900 ml  Output --  Net 900 ml        Physical Exam: Vital Signs Blood pressure 113/80, pulse 63, temperature 98 F (36.7 C), temperature source Oral, resp. rate 17, height 5\' 11"  (1.803 m), weight 82.8 kg, SpO2 92 %. Gen: no distress, normal appearing HEENT: oral mucosa pink and moist, NCAT Cardio: Reg rate Chest: normal effort, normal rate of breathing Abd: soft, non-distended Ext: no edema Psych: pleasant, normal affect Musculoskeletal:     Cervical back: Normal range of motion and neck supple.     Comments: Left elbow boggy olecranon bursa still swollen Skin:    General: Skin is warm and dry.  Neurological:     Mental Status: He is alert.     Comments: Alert Sl HOH Motor: LUE/LLE: 5/5 proximal distal RUE: 4 to 4+/5 proximal and distal with mild ataxia RLE: 4+/5 prox to distal   Assessment/Plan: 1. Functional deficits which require 3+ hours per day of interdisciplinary therapy in a comprehensive inpatient rehab setting. Physiatrist is providing close team supervision and 24 hour management of active medical problems listed below. Physiatrist and rehab team continue to assess barriers to discharge/monitor patient progress toward functional and medical goals  Care Tool:  Bathing    Body parts  bathed by patient: Right arm, Left arm, Chest, Abdomen, Front perineal area, Right upper leg, Left upper leg, Right lower leg, Face, Left lower leg, Buttocks   Body parts bathed by helper: Left lower leg, Right lower leg     Bathing assist Assist Level: Supervision/Verbal cueing     Upper Body Dressing/Undressing Upper body dressing   What is the patient wearing?: Pull over shirt    Upper body assist Assist Level: Supervision/Verbal cueing    Lower Body Dressing/Undressing Lower body dressing      What is the patient wearing?: Underwear/pull up, Pants     Lower body assist Assist for lower body dressing: Contact Guard/Touching assist     Toileting Toileting    Toileting assist Assist for toileting: Contact Guard/Touching assist     Transfers Chair/bed transfer  Transfers assist     Chair/bed transfer assist level: Contact Guard/Touching assist Chair/bed transfer assistive device:   Ambulation assist      Assist level: Minimal Assistance - Patient > 75% Assistive device: Walker-rolling Max distance: 148ft   Walk 10 feet activity   Assist  Walk 10 feet activity did not occur: Safety/medical concerns (fatigue, decreased balance, R hemi, decreased motor planning/coordination)  Assist level: Contact Guard/Touching  assist Assistive device: Walker-rolling   Walk 50 feet activity   Assist Walk 50 feet with 2 turns activity did not occur: Safety/medical concerns (fatigue, decreased balance, R hemi, decreased motor planning/coordination)  Assist level: Contact Guard/Touching assist Assistive device: Walker-rolling    Walk 150 feet activity   Assist Walk 150 feet activity did not occur: Safety/medical concerns (fatigue, decreased balance, R hemi, decreased motor planning/coordination)  Assist level: Minimal Assistance - Patient > 75% Assistive device: Walker-rolling    Walk 10 feet on uneven surface   activity   Assist Walk 10 feet on uneven surfaces activity did not occur: Safety/medical concerns         Wheelchair     Assist Will patient use wheelchair at discharge?: Yes Type of Wheelchair: Manual    Wheelchair assist level: Supervision/Verbal cueing Max wheelchair distance: 6ft    Wheelchair 50 feet with 2 turns activity    Assist        Assist Level: Supervision/Verbal cueing   Wheelchair 150 feet activity     Assist      Assist Level: Maximal Assistance - Patient 25 - 49%   Blood pressure 113/80, pulse 63, temperature 98 F (36.7 C), temperature source Oral, resp. rate 17, height 5\' 11"  (1.803 m), weight 82.8 kg, SpO2 92 %.  Medical Problem List and Plan: 1. 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               -patient may shower             -ELOS/Goals: 14 to 17 days/min a         Continue PT and OT- CIR  Provided education regarding stroke prognosis/strategies to minimize risk of another stroke.  2.  Impaired mobility: -DVT/anticoagulation:  SCDs/TEDs--d/ced Lovenox due to low platelets. PC 83 on 6/6.              -antiplatelet therapy: continue ASA daily.  3. Pain Management: Tylenol prn. Continue lidocaine patch for low back pain. Vitamin D level 49. Continue 1,000U daily supplement.  4. Mood: LCSW to follow for evaluation and support.              -antipsychotic agents: N/A  5. Neuropsych: This patient is capable of making decisions on his own behalf. 6. Skin/Wound Care: Routine pressure relief measures.  7. Fluids/Electrolytes/Nutrition: Monitor I/Os.            encourage PO, good appetite  6/13 I personally reviewed the patient's labs 8.  HTN: continue to monitor blood pressures TID--continue tenormin daily.            Vitals:   01/10/21 0605 01/10/21 0813  BP:  113/80  Pulse:  63  Resp:  17   Temp:    SpO2: 94% 92%   9.  Thrombocytopenia: 6/6 platelets remain in 80-90k range, no bleeding   -continue monitor serially 10.  Hyperglycemia: Hemoglobin A1c 5.5 and likely stress related. 11.  COPD: Stable on Anoro 12.  Dyslipidemia:.             --continue Lovaza and Zocor 13.  AKI on CKD?: Serum creatinine 1.5 at admission. Continue to monitor.               Placed nursing order to encourage 6-8 glasses of water per day  6/13 labs stable, monitor outpatient 14.  Left elbow cystic lesion: likely olecranon bursitis.  Outpatient f/u with [facility] 15. Anxiety/depresion: Continues on home regimen of Prozac 40 mg, Xanax 0.5 mg qid and Triazolam 0.25 mg/hs for insomnia.   16. Constipation:  Colace with supper. Added prune juice with meals. Mag citrate after therapy on 6/5.   -continue senna-s at bedtime with miralax in the am. 17. Tobacco abuse: continue nicotine patch 18. Bradycardia resolved with decrease in atenolol.  19. Disposition: HFU scheduled with me [id number] 11:40AM HFU. Discharge moved up to Saturday. Meds to be sent to [facility] with request to be delivered to room on Friday. Discussed with team.        LOS: 15 days A FACE TO FACE EVALUATION WAS PERFORMED  Drema Pry Jandi Swiger 01/10/2021, 8:23 AM

## 2021-01-10 NOTE — Progress Notes (Signed)
Occupational Therapy Session Note  Patient Details  Name: Juan Brewer MRN: [id number] Date of Birth: October 23, 1945  Today's Date: 01/10/2021 OT Individual Time: 1005-1105 OT Individual Time Calculation (min): 60 min    Short Term Goals:  Week 2:  OT Short Term Goal 1 (Week 2): STG = LTG due to LOS   Skilled Therapeutic Interventions/Progress Updates:    Pt sitting upright in recliner just finishing up with PT and requesting to use the bathroom and then shower. Pt completed all functional mobility throughout session with supervision requiring min intermittent Vcs for safe RW mgt especially when pivoting (to keep placed in front of pt due to him leaving it prematurely), and Vcs to ensure pt approached sitting surface with BLE prior to sitting.    Pt ambulated to bathroom and completed 3 in 1 commode transfer using grab bar.  Toileting completed and pt doffed shorts and underwear in standing at RW despite cues provided to pt safer technique to do while sitting.  Pt needing one cue for problem solving when doffing grip sock and compression dock.  Pt ambulated to walk in shower and completed shower bench transfer using grab bars.  Doffed shirt in sitting with mod I. UB/LB bathing with supervision needing occasional cues to avoid hitting head on shower temperature handle when leaning over.  Pt ambulated using RW to EOB and UB dressing completed with setup, LB dressing completed with supervision.    MMT to LUE noting weakness in left shoulder flexors and abductors as well as external rotators.  Pt instructed through level 2 resistive band exercises including shoulder abd/adduction, ER, and rows BUE.  Pt completed 2 x 15 reps needing occasional multimodal cues for body mechanics.    Call bell in reach, seat belt alarm on at end of session.  Therapy Documentation Precautions:  Precautions Precautions: Fall Precaution Comments: R hemi, mild R inattention Restrictions Weight Bearing Restrictions:  No    Therapy/Group: Individual Therapy  Amie Critchley 01/10/2021, 3:38 PM

## 2021-01-10 NOTE — Progress Notes (Signed)
Pt declining bed alarm stating "it's unamerican" Pt instructed on reason for bed alarm.

## 2021-01-10 NOTE — Progress Notes (Signed)
Patient ID: Juan Brewer, male   DOB: 09/18/1945, 75 y.o.   MRN: [id number]  Spoke with wife to discuss equipment needs. They want the rolling walker, 3 in 1 and tub bench aware tub bench is private pay. Have made referral to [employer] for equipment needs.Pt will use the transport chair his mother in-law has. Pt gong to go to OP therapies will contact wife to set up appointments.

## 2021-01-11 NOTE — Progress Notes (Signed)
Physical Therapy Discharge Summary  Patient Details  Name: Juan Brewer MRN: [id number] Date of Birth: Mar 11, 1946  Today's Date: 01/11/2021 PT Individual Time: 6010-9323 PT Individual Time Calculation (min): 55 min   Patient has met 11 of 11 long term goals due to improved activity tolerance, improved balance, improved postural control, increased strength, functional use of  right lower extremity, improved attention, improved awareness, and improved coordination.  Patient to discharge at an ambulatory level Supervision.  Patient's care partner is independent to provide the necessary physical and cognitive assistance at discharge. [name] attended family education training on 6/15 and verbalized and demonstrated confidence with all tasks to ensure safe discharge home.  All goals met   Recommendation:  Patient will benefit from ongoing skilled PT services in outpatient setting to continue to advance safe functional mobility, address ongoing impairments in transfers, generalized strengthening, dynamic standing balance/coordination, gait training, NMR, endurance, and  to minimize fall risk.  Equipment: RW, 18x18 manual WC  Reasons for discharge: treatment goals met  Patient/family agrees with progress made and goals achieved: Yes  Today's Interventions Received pt supine in bed, pt agreeable to PT treatment, and denied any pain during session. Session with emphasis on discharge planning, functional mobility/transfers, generalized strengthening, dynamic standing balance/coordination, gait training, stair navigation, simulated car transfers, toileting, and improved activity tolerance. Pt's equipment delivered and therapist adjusted height of RW. Pt performed bed mobility with mod I and increased time and donned shoes sitting EOB with supervision. Pt transferred bed<>WC stand<>pivot with RW and supervision and performed WC mobility 19f using BUE and supervision to therapy gym. Pt navigated 12  steps with 2 rails and CGA/min A ascending and descending with a step through pattern. Pt ambulated 763fx 2 trials with RW and close supervision CGA to/from ortho gym and performed ambulatory simulated car transfer with RW and supervision. Pt ambulated 1056fn uneven surfaces (ramp) with RW and CGA and able to stand and pick up small cup from floor with RW and CGA. Pt reported urge to toilet and was [facility] but before pt could make it to bathroom began urinating. Ambulated 8ft5fth RW and CGA to bathroom and removed soiled clothing with max A for time management purposes. Pt able to continue voiding and with small BM. Pt able to perform peri-care standing with CGA but making a mess and needed multiple washcloths to clean himself. Donned clean brief with max A and ambulated 10ft63fh RW and CGA to sink. Pt stood at sink and washed hands with close supervision. Returned to bed and transferred sit<>supine mod I. Provided pt with new shorts to put on. Concluded session with pt supine in bed, needs within reach, and bed alarm on.   PT Discharge Precautions/Restrictions Precautions Precautions: Fall Precaution Comments: R hemi Restrictions Weight Bearing Restrictions: No Cognition Overall Cognitive Status: Within Functional Limits for tasks assessed Arousal/Alertness: Awake/alert Orientation Level: Oriented X4 Memory: Impaired Awareness: Impaired Problem Solving: Impaired Safety/Judgment: Impaired Comments: mild impulsivity and decreased insight into deficits Sensation Sensation Light Touch: Appears Intact Hot/Cold: Appears Intact Proprioception: Appears Intact Coordination Gross Motor Movements are Fluid and Coordinated: No Fine Motor Movements are Fluid and Coordinated: No Coordination and Movement Description: mild uncoordination due to R hemi, decreased balance/postural control, decreased motor planning/sequencing, and decreased awareness; improved since  eval Finger Nose Finger Test: mild dysmetria bilaterally Heel Shin Test: decreased coordination bilaterally Motor  Motor Motor: Hemiplegia;Abnormal postural alignment and control Motor - Skilled Clinical  Observations: mild uncoordination due to R hemi, decreased balance/postural control, decreased motor planning/sequencing, and decreased awareness; improved since eval  Mobility Bed Mobility Bed Mobility: Rolling Right;Rolling Left;Sit to Supine;Supine to Sit Rolling Right: Independent with assistive device Rolling Left: Independent with assistive device Supine to Sit: Independent with assistive device Sit to Supine: Independent with assistive device Transfers Transfers: Sit to Stand;Stand to Sit;Stand Pivot Transfers Sit to Stand: Supervision/Verbal cueing Stand to Sit: Supervision/Verbal cueing Stand Pivot Transfers: Supervision/Verbal cueing Stand Pivot Transfer Details: Verbal cues for technique;Verbal cues for precautions/safety;Verbal cues for safe use of DME/AE Stand Pivot Transfer Details (indicate cue type and reason): verbal cues for AD management when turning and to reach back prior to sitting Transfer (Assistive device): Rolling walker Locomotion  Gait Ambulation: Yes Gait Assistance: Supervision/Verbal cueing Gait Distance (Feet): 150 Feet Assistive device: Rolling walker Gait Assistance Details: Verbal cues for sequencing;Verbal cues for technique;Verbal cues for precautions/safety;Verbal cues for gait pattern;Verbal cues for safe use of DME/AE Gait Assistance Details: verbal cues for proximity to RW, to increase step length and widen BOS, and for safety when turning Gait Gait: Yes Gait Pattern: Impaired Gait Pattern: Decreased stance time - right;Decreased trunk rotation;Decreased stride length;Decreased weight shift to right;Decreased step length - right;Decreased step length - left;Trunk flexed;Poor foot clearance - left;Poor foot clearance - right;Narrow base of  support Gait velocity: decreased Stairs / Additional Locomotion Stairs: Yes Stairs Assistance: Minimal Assistance - Patient > 75% Stair Management Technique: Two rails Number of Stairs: 12 Height of Stairs: 6 Ramp: Contact Guard/touching assist (RW) Curb: Minimal Assistance - Patient >75% (RW) Product manager Mobility: Yes Wheelchair Assistance: Chartered loss adjuster: Both upper extremities Wheelchair Parts Management: Supervision/cueing Distance: 122f  Trunk/Postural Assessment  Cervical Assessment Cervical Assessment: Exceptions to [facility] head) Thoracic Assessment Thoracic Assessment: Exceptions to [facility](mild kyphosis) Lumbar Assessment Lumbar Assessment: Exceptions to [facility](posterior pelvic tilt) Postural Control Postural Control: Deficits on evaluation  Balance Balance Balance Assessed: Yes Static Sitting Balance Static Sitting - Balance Support: Feet supported;Bilateral upper extremity supported Static Sitting - Level of Assistance: 6: Modified independent (Device/Increase time) Dynamic Sitting Balance Dynamic Sitting - Balance Support: Feet supported;No upper extremity supported Dynamic Sitting - Level of Assistance: 6: Modified independent (Device/Increase time) Static Standing Balance Static Standing - Balance Support: Bilateral upper extremity supported (RW) Static Standing - Level of Assistance: 5: Stand by assistance (supervision) Dynamic Standing Balance Dynamic Standing - Balance Support: Bilateral upper extremity supported (RW) Dynamic Standing - Level of Assistance: 5: Stand by assistance (supervision) Extremity Assessment RLE Assessment RLE Assessment: Exceptions to [facility] St[facility] Comments: grossly generalized to 4/5 LLE Assessment LLE Assessment: Exceptions to [facility] Strength Comments: grossly generalized to 4+/5  AAlfonse AlpersPT, DPT  01/11/2021, 7:46 AM

## 2021-01-11 NOTE — Progress Notes (Signed)
Speech Language Pathology Discharge Summary  Patient Details  Name: Juan Brewer MRN: [id number] Date of Birth: 1945/12/25  Today's Date: 01/11/2021 SLP Individual Time: 3241-9914 SLP Individual Time Calculation (min): 40 min   Skilled Therapeutic Interventions:  Patient seen for skilled ST session focused on discussing upcoming discharge and patient's awareness to deficits and needs. 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 Patient is aware and verbalized understanding of need for supervision as well as outpatient therapies.      Patient has met 2 of 4 long term goals.  Patient to discharge at overall Min;Supervision level.  Reasons goals not met: Patient did not achieve supervision level as anticipated at initial evaluation   Clinical Impression/Discharge Summary: Patient demonstrated progress and currently is at supervision to minA level overall, but with min-modA cues needed at times during alternating attention and anticipatory awareness tasks. Patient has demonstrated improvement overall in all areas and is expected to continue to improve with outpatient SLP upon discharge. Patient and family both in agreement that he will require supervision and outpatient therapies in all disciplines.  Care Partner:  Caregiver Able to Provide Assistance: Yes  Type of Caregiver Assistance: Cognitive;Physical  Recommendation:  Outpatient SLP  Rationale for SLP Follow Up: Maximize cognitive function and independence;Reduce caregiver burden   Equipment: N/A   Reasons for discharge: Discharged from hospital   Patient/Family Agrees with Progress Made and Goals Achieved: Yes   Sonia Baller, MA, CCC-SLP Speech Therapy

## 2021-01-11 NOTE — Progress Notes (Signed)
PROGRESS NOTE   Subjective/Complaints: No complaints this morning.  On commode Ready for d/c tomorrow!  ROS:   Pt denies SOB, abd pain, CP, N/V/C/D, and vision changes, constipation  Objective:   No results found. No results for input(s): WBC, HGB, HCT, PLT in the last 72 hours.   No results for input(s): NA, K, CL, CO2, GLUCOSE, BUN, CREATININE, CALCIUM in the last 72 hours.    Intake/Output Summary (Last 24 hours) at 01/11/2021 1027 Last data filed at 01/11/2021 0800 Gross per 24 hour  Intake 400 ml  Output 300 ml  Net 100 ml        Physical Exam: Vital Signs Blood pressure 119/88, pulse (!) 59, temperature 97.6 F (36.4 C), resp. rate 16, height 5\' 11"  (1.803 m), weight 82.8 kg, SpO2 95 %. Gen: no distress, normal appearing HEENT: oral mucosa pink and moist, NCAT Cardio: Bradycardia Chest: normal effort, normal rate of breathing Abd: soft, non-distended Ext: no edema Psych: pleasant, normal affect Musculoskeletal:     Cervical back: Normal range of motion and neck supple.     Comments: Left elbow boggy olecranon bursa still swollen Skin:    General: Skin is warm and dry.  Neurological:     Mental Status: He is alert.     Comments: Alert Sl HOH Motor: LUE/LLE: 5/5 proximal distal RUE: 4 to 4+/5 proximal and distal with mild ataxia RLE: 4+/5 prox to distal   Assessment/Plan: 1. Functional deficits which require 3+ hours per day of interdisciplinary therapy in a comprehensive inpatient rehab setting. Physiatrist is providing close team supervision and 24 hour management of active medical problems listed below. Physiatrist and rehab team continue to assess barriers to discharge/monitor patient progress toward functional and medical goals  Care Tool:  Bathing    Body parts bathed by patient: Right arm, Left arm, Chest, Abdomen, Front perineal area, Right upper leg, Left upper leg, Right lower leg,  Face, Left lower leg, Buttocks   Body parts bathed by helper: Left lower leg, Right lower leg     Bathing assist Assist Level: Supervision/Verbal cueing     Upper Body Dressing/Undressing Upper body dressing   What is the patient wearing?: Pull over shirt    Upper body assist Assist Level: Set up assist    Lower Body Dressing/Undressing Lower body dressing      What is the patient wearing?: Underwear/pull up, Pants     Lower body assist Assist for lower body dressing: Supervision/Verbal cueing     Toileting Toileting    Toileting assist Assist for toileting: Supervision/Verbal cueing     Transfers Chair/bed transfer  Transfers assist     Chair/bed transfer assist level: Contact Guard/Touching assist Chair/bed transfer assistive device:   Ambulation assist      Assist level: Supervision/Verbal cueing Assistive device: Walker-rolling Max distance: 139ft   Walk 10 feet activity   Assist  Walk 10 feet activity did not occur: Safety/medical concerns (fatigue, decreased balance, R hemi, decreased motor planning/coordination)  Assist level: Supervision/Verbal cueing Assistive device: Walker-rolling   Walk 50 feet activity   Assist Walk 50 feet with 2 turns activity did not occur:  Safety/medical concerns (fatigue, decreased balance, R hemi, decreased motor planning/coordination)  Assist level: Supervision/Verbal cueing Assistive device: Walker-rolling    Walk 150 feet activity   Assist Walk 150 feet activity did not occur: Safety/medical concerns (fatigue, decreased balance, R hemi, decreased motor planning/coordination)  Assist level: Supervision/Verbal cueing Assistive device: Walker-rolling    Walk 10 feet on uneven surface  activity   Assist Walk 10 feet on uneven surfaces activity did not occur: Safety/medical concerns         Wheelchair     Assist Will patient use wheelchair at discharge?:  Yes Type of Wheelchair: Manual    Wheelchair assist level: Supervision/Verbal cueing Max wheelchair distance: 53ft    Wheelchair 50 feet with 2 turns activity    Assist        Assist Level: Supervision/Verbal cueing   Wheelchair 150 feet activity     Assist      Assist Level: Maximal Assistance - Patient 25 - 49%   Blood pressure 119/88, pulse (!) 59, temperature 97.6 F (36.4 C), resp. rate 16, height 5\' 11"  (1.803 m), weight 82.8 kg, SpO2 95 %.  Medical Problem List and Plan: 1. 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               -patient may shower             -ELOS/Goals: 14 to 17 days/min a         Continue PT and OT- CIR  Provided education regarding stroke prognosis/strategies to minimize risk of another stroke.  2.  Impaired mobility: -DVT/anticoagulation:  SCDs/TEDs--d/ced Lovenox due to low platelets. PC 83 on 6/6.              -antiplatelet therapy: continue ASA daily.  3. Pain Management: Tylenol prn. Continue idocaine patch for low back pain. Vitamin D level 49. Continue 1,000U daily supplement.  4. Mood: LCSW to follow for evaluation and support.              -antipsychotic agents: N/A  5. Neuropsych: This patient is capable of making decisions on his own behalf. 6. Skin/Wound Care: Routine pressure relief measures.  7. Fluids/Electrolytes/Nutrition: Monitor I/Os.            encourage PO, good appetite  6/13 I personally reviewed the patient's labs 8.  HTN: continue to monitor blood pressures TID--continue tenormin daily.            Vitals:   01/11/21 0820 01/11/21 0824  BP:    Pulse: (!) 59   Resp:    Temp:    SpO2:  95%   9.  Thrombocytopenia: 6/6 platelets remain in 80-90k range, no bleeding   -continue monitor serially 10.  Hyperglycemia: Hemoglobin A1c 5.5 and likely stress related. 11.  COPD: Stable on  Anoro 12.  Dyslipidemia:.             --continue Lovaza and Zocor 13.  AKI on CKD?: Serum creatinine 1.5 at admission. Continue to monitor.               Placed nursing order to encourage 6-8 glasses of water per day  6/13 labs stable, monitor outpatient 14.  Left elbow cystic lesion: likely olecranon bursitis.  Outpatient f/u with [facility] 15. Anxiety/depresion: Continues on home regimen of Prozac 40 mg, Xanax 0.5 mg qid and Triazolam 0.25 mg/hs for insomnia.  16. Constipation:  Colace with supper. Added prune juice with meals. Mag citrate after therapy on 6/5.   -continue senna-s at bedtime with miralax in the am. 17. Tobacco abuse: continue nicotine patch 18. Bradycardia: d/c atenolol.  19. Disposition: HFU scheduled with me [id number] 11:40AM HFU. Discharge moved up to Saturday. Meds to be sent to [facility] with request to be delivered to room on Friday. Discussed with team.        LOS: 16 days A FACE TO FACE EVALUATION WAS PERFORMED  Clint Bolder P Jamileth Putzier 01/11/2021, 10:27 AM

## 2021-01-11 NOTE — Progress Notes (Addendum)
Occupational Therapy Discharge Summary  Patient Details  Name: Munir Victorian MRN: [id number] Date of Birth: August 20, 1945  Today's Date: 01/11/2021 OT Individual Time: 7035-0093 OT Individual Time Calculation (min): 71 min    Patient has met 13 of 13 long term goals due to improved activity tolerance, improved balance, postural control, functional use of  RIGHT upper extremity, improved awareness, and improved coordination.  Patient to discharge at overall Supervision level.  Patient's care partner is independent to provide the necessary physical assistance at discharge.    Reasons goals not met: N/A  Recommendation:  Patient will benefit from ongoing skilled OT services in outpatient setting to continue to advance functional skills in the area of BADL, iADL, Vocation, and Reduce care partner burden.  Equipment: 3-in-1 shower tub bench  Reasons for discharge: treatment goals met  Patient/family agrees with progress made and goals achieved: Yes. [name] educated during family education session and verbalized understanding.   OT Discharge Precautions/Restrictions  Precautions Precautions: Fall Precaution Comments: R hemi Restrictions Weight Bearing Restrictions: No Pain Pain Assessment Pain Scale: 0-10 Pain Score: 0-No pain Faces Pain Scale: No hurt ADL ADL Eating: Set up Grooming: Setup Where Assessed-Grooming: Standing at sink Upper Body Bathing: Supervision/safety, Minimal cueing Where Assessed-Upper Body Bathing: Shower Lower Body Bathing: Supervision/safety, Minimal cueing Where Assessed-Lower Body Bathing: Shower Upper Body Dressing: Supervision/safety Where Assessed-Upper Body Dressing: Edge of bed Lower Body Dressing: Supervision/safety Where Assessed-Lower Body Dressing: Edge of bed Toileting: Supervision/safety Where Assessed-Toileting: Glass blower/designer: Close supervision Toilet Transfer Method: Ambulating, Stand pivot Science writer: Grab  bars, Raised toilet seat Tub/Shower Transfer: Close supervison Clinical cytogeneticist Method: Stand pivot, Ambulating Tub/Shower Equipment: Facilities manager: Close supervision Social research officer, government Method: Stand pivot, Heritage manager: Civil engineer, contracting with back, Grab bars Vision Baseline Vision/History: Wears glasses Wears Glasses: Reading only Patient Visual Report: No change from baseline Perception  Perception: Impaired Inattention/Neglect: Does not attend to right side of body Praxis Praxis: Intact Cognition Overall Cognitive Status: Within Functional Limits for tasks assessed Arousal/Alertness: Awake/alert Orientation Level: Oriented X4 Memory: Impaired Awareness: Impaired Problem Solving: Impaired Safety/Judgment: Impaired Comments: mild impulsivity and decreased insight into deficits Sensation Sensation Light Touch: Appears Intact Hot/Cold: Appears Intact Motor  Motor Motor: Hemiplegia;Abnormal postural alignment and control Motor - Skilled Clinical Observations: mild uncoordination due to R hemi, decreased balance/postural control, decreased motor planning/sequencing, and decreased awareness; improved since eval Mobility  Bed Mobility Bed Mobility: Rolling Right;Rolling Left;Sit to Supine;Supine to Sit Rolling Right: Independent with assistive device Rolling Left: Independent with assistive device Supine to Sit: Independent with assistive device Sit to Supine: Independent with assistive device Transfers Sit to Stand: Supervision/Verbal cueing Stand to Sit: Supervision/Verbal cueing  Trunk/Postural Assessment  Cervical Assessment Cervical Assessment: Exceptions to [facility] Thoracic Assessment Thoracic Assessment: Exceptions to [facility] Lumbar Assessment Lumbar Assessment: Exceptions to [facility] Postural Control Postural Control: Deficits on evaluation Trunk Control: R lateral and posterior lean  Balance Balance Balance Assessed:  Yes Static Sitting Balance Static Sitting - Balance Support: Feet supported;Bilateral upper extremity supported Static Sitting - Level of Assistance: 6: Modified independent (Device/Increase time) Dynamic Sitting Balance Dynamic Sitting - Balance Support: Feet supported;No upper extremity supported Dynamic Sitting - Level of Assistance: 6: Modified independent (Device/Increase time) Static Standing Balance Static Standing - Balance Support: Bilateral upper extremity supported Static Standing - Level of Assistance: 5: Stand by assistance Dynamic Standing Balance Dynamic Standing - Balance Support: Bilateral upper extremity supported Dynamic Standing - Level of Assistance:  5: Stand by assistance Dynamic Standing - Balance Activities: Lateral lean/weight shifting;[place];Reaching across midline Extremity/Trunk Assessment RUE Assessment RUE Assessment: Exceptions to [facility] General Strength Comments: 4+/5 RUE AROM (degrees) Overall AROM Right Upper Extremity: Deficits Right Shoulder Flexion: 100 Degrees LUE Assessment LUE Assessment: Within Functional Limits  Skilled Therapeutic Interventions/Progress Updates:  Met pt lying supine slightly reclined, bed alarm on, pt agreed to session. Treatment session was focused on self-care retraining, assessing UE coordination and functional transfers. Pt was close supervision for bed mobility. Pt was close supervision for sit > stand from EOB to RW. Pt ambulated to bathroom with RW with close supervision. OTS offered toileting, pt declined. Pt doffed all clothing while sitting on shower chair. Pt completed UB and LB bathing with close supervision and min cues for sequencing. Pt expressed frustration with incontinence brief and requested to wear underwear. Pt was set-up assist for UB dressing and close supervision for LB dressing while sitting EOB. Pt was close supervision for donning footwear. Pt was brought to therapy bathroom and completed transfers to and  from shower bench and commode with close supervision. Pt verbalized transfer process while completing transfer to ensure safety and understanding. Pt completed the box and blocks assessment. Pt scored 37 (R hand, dominant hand), 47 (L hand). Pt reported "I got the feel for it" when told the L hand score. Pt completed the 9-hole peg test. Pt scored 39 seconds (R hand) but required cues for sequencing task using R hand, and pt scored 35 (L hand). OTS brought pt to dayroom and played corn hole. Pt was not able to maintain dynamic standing balance throughout activity, OTS instructed pt to sit to ensure safety. Pt with lose of balance with inability to correct, requiring Mod A to safely bring pt to chair, to prevent a fall. Pt demonstrates decreased safety awareness, even joking, about near fall. OT educated on R inattention and RLE instability, which increases pt fall risk. Throughout session, pt required minimal cues for safety during transfers, OTS instructed pt to retry transfers when done unsafely/improperly. Left pt lying in bed, bed alarm on, all needs met.   Valaree Fresquez 01/11/2021, 12:24 PM

## 2021-01-12 DIAGNOSIS — I6300 Cerebral infarction due to thrombosis of unspecified precerebral artery: Secondary | ICD-10-CM

## 2021-01-12 NOTE — Progress Notes (Signed)
PA Pam in with pt to discuss discharge instructions. Pt has no further questions.  Mylo Red, LPN

## 2021-01-12 NOTE — Progress Notes (Addendum)
Discharge instructions discussed with daughter and son. Pt/Family in agreement. Meds returned to family. Family and pt have no further questions. Belongings gathered and returned to pt. Pt left per wheelchair to private vehicle. No complications noted. Mylo Red, LPN

## 2021-01-12 NOTE — Progress Notes (Addendum)
PROGRESS NOTE   Subjective/Complaints:  Feels ok today   ROS:   Pt denies SOB, abd pain, CP, N/V/C/D, and vision changes, constipation  Objective:   No results found. No results for input(s): WBC, HGB, HCT, PLT in the last 72 hours.   No results for input(s): NA, K, CL, CO2, GLUCOSE, BUN, CREATININE, CALCIUM in the last 72 hours.    Intake/Output Summary (Last 24 hours) at 01/12/2021 0720 Last data filed at 01/12/2021 0400 Gross per 24 hour  Intake 710 ml  Output 725 ml  Net -15 ml         Physical Exam: Vital Signs Blood pressure 101/62, pulse 63, temperature 97.7 F (36.5 C), temperature source Oral, resp. rate 16, height 5\' 11"  (1.803 m), weight 82.8 kg, SpO2 93 %.  General: No acute distress Mood and affect are appropriate Heart: Regular rate and rhythm no rubs murmurs or extra sounds Lungs: Clear to auscultation, breathing unlabored, no rales or wheezes Abdomen: Positive bowel sounds, soft nontender to palpation, nondistended Extremities: No clubbing, cyanosis, or edema Skin: No evidence of breakdown, no evidence of rash   Musculoskeletal:     Cervical back: Normal range of motion and neck supple.     Comments: Left elbow boggy olecranon bursa still swollen Skin:    General: Skin is warm and dry.  Neurological:     Mental Status: He is alert.     Comments: Alert Sl HOH Motor: LUE/LLE: 5/5 proximal distal RUE: 4 to 4+/5 proximal and distal with mild ataxia RLE: 4+/5 prox to distal   Assessment/Plan: 1. Functional deficits  Stable for D/C today F/u PCP in 3-4 weeks F/u PM&R 2 weeks See D/C summary See D/C instructions  Care Tool:  Bathing    Body parts bathed by patient: Right arm, Left arm, Chest, Abdomen, Front perineal area, Right upper leg, Left upper leg, Right lower leg, Face, Left lower leg, Buttocks   Body parts bathed by helper: Left lower leg, Right lower leg     Bathing  assist Assist Level: Supervision/Verbal cueing     Upper Body Dressing/Undressing Upper body dressing   What is the patient wearing?: Pull over shirt    Upper body assist Assist Level: Set up assist    Lower Body Dressing/Undressing Lower body dressing      What is the patient wearing?: Underwear/pull up, Pants     Lower body assist Assist for lower body dressing: Supervision/Verbal cueing     Toileting Toileting    Toileting assist Assist for toileting: Supervision/Verbal cueing     Transfers Chair/bed transfer  Transfers assist     Chair/bed transfer assist level: Supervision/Verbal cueing Chair/bed transfer assistive device:   Ambulation assist      Assist level: Supervision/Verbal cueing Assistive device: Walker-rolling Max distance: 113ft   Walk 10 feet activity   Assist  Walk 10 feet activity did not occur: Safety/medical concerns (fatigue, decreased balance, R hemi, decreased motor planning/coordination)  Assist level: Supervision/Verbal cueing Assistive device: Walker-rolling   Walk 50 feet activity   Assist Walk 50 feet with 2 turns activity did not occur: Safety/medical concerns (fatigue, decreased balance, R  hemi, decreased motor planning/coordination)  Assist level: Supervision/Verbal cueing Assistive device: Walker-rolling    Walk 150 feet activity   Assist Walk 150 feet activity did not occur: Safety/medical concerns (fatigue, decreased balance, R hemi, decreased motor planning/coordination)  Assist level: Supervision/Verbal cueing Assistive device: Walker-rolling    Walk 10 feet on uneven surface  activity   Assist Walk 10 feet on uneven surfaces activity did not occur: Safety/medical concerns   Assist level: Contact Guard/Touching assist Assistive device: Photographer Will patient use wheelchair at discharge?: Yes Type of Wheelchair: Manual     Wheelchair assist level: Supervision/Verbal cueing Max wheelchair distance: 171ft    Wheelchair 50 feet with 2 turns activity    Assist        Assist Level: Supervision/Verbal cueing   Wheelchair 150 feet activity     Assist      Assist Level: Supervision/Verbal cueing   Blood pressure 101/62, pulse 63, temperature 97.7 F (36.5 C), temperature source Oral, resp. rate 16, height 5\' 11"  (1.803 m), weight 82.8 kg, SpO2 93 %.  Medical Problem List and Plan: 1. 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               -d/c today          Continue PT and OT- CIR  Provided education regarding stroke prognosis/strategies to minimize risk of another stroke.  2.  Impaired mobility: -DVT/anticoagulation:  SCDs/TEDs--d/ced Lovenox due to low platelets. PC 83 on 6/6.              -antiplatelet therapy: continue ASA daily.  3. Pain Management: Tylenol prn. Continue idocaine patch for low back pain. Vitamin D level 49. Continue 1,000U daily supplement.  4. Mood: LCSW to follow for evaluation and support.              -antipsychotic agents: N/A  5. Neuropsych: This patient is capable of making decisions on his own behalf. 6. Skin/Wound Care: Routine pressure relief measures.  7. Fluids/Electrolytes/Nutrition: Monitor I/Os.            encourage PO, good appetite  6/13 I personally reviewed the patient's labs 8.  HTN: continue to monitor blood pressures TID--continue tenormin daily.            Vitals:   01/11/21 1930 01/12/21 0419  BP: 128/79 101/62  Pulse: 60 63  Resp: (!) 22 16  Temp: 97.6 F (36.4 C) 97.7 F (36.5 C)  SpO2: 91% 93%   9.  Thrombocytopenia: 6/6 platelets remain in 80-90k range, no bleeding   -continue monitor serially 10.  Hyperglycemia: Hemoglobin A1c 5.5 and likely stress related. 11.  COPD: Stable on Anoro 12.   Dyslipidemia:.             --continue Lovaza and Zocor 13.  AKI on CKD?: Serum creatinine 1.5 at admission. Continue to monitor.               Placed nursing order to encourage 6-8 glasses of water per day  6/13 labs stable, monitor outpatient 14.  Left elbow cystic lesion: likely olecranon bursitis.  Outpatient f/u with [facility] 15. Anxiety/depresion: Continues on home regimen of Prozac 40 mg, Xanax 0.5 mg qid and Triazolam 0.25 mg/hs for insomnia.   16. Constipation:  Colace with supper. Added prune juice with meals. Mag citrate after therapy on 6/5.   -  continue senna-s at bedtime with miralax in the am. 17. Tobacco abuse: continue nicotine patch 18. Bradycardia: d/c atenolol.  19. Disposition: HFU scheduled with me [id number] 11:40AM HFU. Discharge moved up to Saturday. Meds to be sent to [facility] with request to be delivered to room on Friday. Discussed with team.        LOS: 17 days A FACE TO FACE EVALUATION WAS PERFORMED  Erick Colace 01/12/2021, 7:20 AM

## 2021-01-14 ENCOUNTER — Telehealth: Payer: Self-pay

## 2021-01-14 NOTE — Telephone Encounter (Signed)
Transition Care Management Unsuccessful Follow-up Telephone Call  Date of discharge and from where:  12/26/2020 [facility] [place]     Attempts:  1st Attempt  Reason for unsuccessful TCM follow-up call:  Left voice message

## 2021-01-22 ENCOUNTER — Telehealth: Payer: Self-pay | Admitting: Emergency Medicine

## 2021-01-22 NOTE — Telephone Encounter (Signed)
Patient has been placed on wait list.  The appointment on 9/19 is the earliest appointment available.

## 2021-01-22 NOTE — Telephone Encounter (Signed)
-----   Message from Micki Riley, MD sent at 01/18/2021  4:30 PM EDT ----- Regarding: RE: Please advise I have copied my nurse to try to schedule this patient earlier if we have a cancellation. ----- Message ----- From: Carlus Pavlov Sent: 01/15/2021   9:45 AM EDT To: Micki Riley, MD Subject: Please advise                                  Hi Dr. Pearlean Brownie,  This patient was referred by Dr. Roda Shutters to f/u with you in 4-6 weeks. I have him scheduled for your next available 04/15/21. Is this okay to keep or should he be worked in sooner?  Thank you

## 2021-03-26 ENCOUNTER — Encounter
Payer: Medicare Other | Attending: Physical Medicine and Rehabilitation | Admitting: Physical Medicine and Rehabilitation

## 2021-04-15 ENCOUNTER — Ambulatory Visit: Payer: Medicare Other | Admitting: Neurology

## 2021-05-11 ENCOUNTER — Other Ambulatory Visit: Payer: Self-pay

## 2021-05-11 ENCOUNTER — Emergency Department (HOSPITAL_BASED_OUTPATIENT_CLINIC_OR_DEPARTMENT_OTHER): Payer: Medicare Other

## 2021-05-11 ENCOUNTER — Encounter (HOSPITAL_BASED_OUTPATIENT_CLINIC_OR_DEPARTMENT_OTHER): Payer: Self-pay | Admitting: *Deleted

## 2021-05-11 ENCOUNTER — Emergency Department (HOSPITAL_BASED_OUTPATIENT_CLINIC_OR_DEPARTMENT_OTHER)
Admission: EM | Admit: 2021-05-11 | Discharge: 2021-05-11 | Disposition: A | Discharge status: Home / Self Care | Payer: Medicare Other | Attending: Emergency Medicine | Admitting: Emergency Medicine

## 2021-05-11 DIAGNOSIS — Z7982 Long term (current) use of aspirin: Secondary | ICD-10-CM | POA: Diagnosis not present

## 2021-05-11 DIAGNOSIS — Z79899 Other long term (current) drug therapy: Secondary | ICD-10-CM | POA: Diagnosis not present

## 2021-05-11 DIAGNOSIS — R531 Weakness: Secondary | ICD-10-CM | POA: Insufficient documentation

## 2021-05-11 DIAGNOSIS — F17210 Nicotine dependence, cigarettes, uncomplicated: Secondary | ICD-10-CM | POA: Insufficient documentation

## 2021-05-11 DIAGNOSIS — I4510 Unspecified right bundle-branch block: Secondary | ICD-10-CM | POA: Insufficient documentation

## 2021-05-11 DIAGNOSIS — N189 Chronic kidney disease, unspecified: Secondary | ICD-10-CM | POA: Insufficient documentation

## 2021-05-11 DIAGNOSIS — R296 Repeated falls: Secondary | ICD-10-CM | POA: Insufficient documentation

## 2021-05-11 DIAGNOSIS — I129 Hypertensive chronic kidney disease with stage 1 through stage 4 chronic kidney disease, or unspecified chronic kidney disease: Secondary | ICD-10-CM | POA: Insufficient documentation

## 2021-05-11 HISTORY — DX: Cerebral infarction, unspecified: I63.9

## 2021-05-11 LAB — CBC WITH DIFFERENTIAL/PLATELET
Abs Immature Granulocytes: 0.04 10*3/uL (ref 0.00–0.07)
Basophils Absolute: 0 10*3/uL (ref 0.0–0.1)
Basophils Relative: 0 %
Eosinophils Absolute: 0.1 10*3/uL (ref 0.0–0.5)
Eosinophils Relative: 1 %
HCT: 38.8 % — ABNORMAL LOW (ref 39.0–52.0)
Hemoglobin: 13.7 g/dL (ref 13.0–17.0)
Immature Granulocytes: 0 %
Lymphocytes Relative: 12 %
Lymphs Abs: 1.2 10*3/uL (ref 0.7–4.0)
MCH: 31.6 pg (ref 26.0–34.0)
MCHC: 35.3 g/dL (ref 30.0–36.0)
MCV: 89.6 fL (ref 80.0–100.0)
Monocytes Absolute: 1.2 10*3/uL — ABNORMAL HIGH (ref 0.1–1.0)
Monocytes Relative: 12 %
Neutro Abs: 7.3 10*3/uL (ref 1.7–7.7)
Neutrophils Relative %: 75 %
Platelets: 137 10*3/uL — ABNORMAL LOW (ref 150–400)
RBC: 4.33 MIL/uL (ref 4.22–5.81)
RDW: 11.9 % (ref 11.5–15.5)
WBC: 9.8 10*3/uL (ref 4.0–10.5)
nRBC: 0 % (ref 0.0–0.2)

## 2021-05-11 LAB — COMPREHENSIVE METABOLIC PANEL
ALT: 32 U/L (ref 0–44)
AST: 27 U/L (ref 15–41)
Albumin: 3.2 g/dL — ABNORMAL LOW (ref 3.5–5.0)
Alkaline Phosphatase: 79 U/L (ref 38–126)
Anion gap: 9 (ref 5–15)
BUN: 24 mg/dL — ABNORMAL HIGH (ref 8–23)
CO2: 24 mmol/L (ref 22–32)
Calcium: 9.4 mg/dL (ref 8.9–10.3)
Chloride: 103 mmol/L (ref 98–111)
Creatinine, Ser: 1.39 mg/dL — ABNORMAL HIGH (ref 0.61–1.24)
GFR, Estimated: 54 mL/min — ABNORMAL LOW (ref >60)
Glucose, Bld: 89 mg/dL (ref 70–99)
Potassium: 3.8 mmol/L (ref 3.5–5.1)
Sodium: 136 mmol/L (ref 135–145)
Total Bilirubin: 0.7 mg/dL (ref 0.3–1.2)
Total Protein: 7 g/dL (ref 6.5–8.1)

## 2021-05-11 NOTE — ED Provider Notes (Signed)
[facility] EMERGENCY DEPARTMENT Provider Note   CSN: [id number] Arrival date & time: 05/11/21  1702     History Chief Complaint  Patient presents with   Weakness    Bryceton Hantz is a 75 y.o. male.  Patient is a 75 year old male with a history of hypertension, stroke, chronic kidney disease who is presenting today with his wife due to generalized weakness and sleeping all the time.  Patient reports that he feels fine and he does not feel like he needs to be here however his wife reports that on Monday he started having URI symptoms with cough and congestion.  He was also displaying some evidence of weakness which he had been doing very well after his stroke and having rehab and was able to walk with a cane.  Because his symptoms worsened with some shortness of breath, nasal congestion and increased urinary frequency he went to his doctor at [facility] and at that time they told him he had a small amount of pneumonia and a mild UTI.  He was started on Levaquin which he has taken Thursday and Friday as well as this morning.  Wife reports Thursday and Friday he has been more weak with multiple falls at home where his legs were just giving out or he was having a hard time comprehending what to do with his legs.  He slept all day yesterday and from 8:00 this morning till 1 in the afternoon.  This morning she went out to get him the paper and when she came back he was laying in the bathroom floor.  He denies passing out remembers falling and denies injuring himself.  He denies hitting his head.  He has not had any chest pain or shortness of breath.  He denies any abdominal pain nausea or vomiting.  No diarrhea.  His wife reports that he has been eating and they have been pushing fluids which he has done well with and he is urinating regularly.  They were just concerned but he was able to get out of bed this afternoon and he was able to ambulate with his walker.  He has had no other  medication changes except for the Levaquin.  The history is provided by the patient.  Weakness     Past Medical History:  Diagnosis Date   Hypertension    Stroke (cerebrum) [facility])     Patient Active Problem List   Diagnosis Date Noted   Chronic kidney disease 01/09/2021   Constipation 01/09/2021   CVA (cerebral vascular accident) (HCC) 12/26/2020   Dyslipidemia    Thrombocytopenia (HCC)    Tobacco abuse    Right hemiparesis (HCC)    Acute CVA (cerebrovascular accident) (HCC) 12/22/2020   TIA (transient ischemic attack) 12/21/2020   Essential hypertension 12/21/2020    Past Surgical History:  Procedure Laterality Date   CHOLECYSTECTOMY         Family History  Problem Relation Age of Onset   Dementia Father    Parkinson's disease Father    Healthy Brother     Social History   Tobacco Use   Smoking status: Every Day    Packs/day: 1.00    Types: Cigarettes   Smokeless tobacco: Never  Substance Use Topics   Drug use: Never    Home Medications Prior to Admission medications   Medication Sig Start Date End Date Taking? Authorizing Provider  acetaminophen (TYLENOL) 325 MG tablet Take 3 tablets (975 mg total) by mouth 2 (two) times  daily. 12/26/20   Pokhrel, Rebekah Chesterfield, MD  ALPRAZolam Prudy Feeler) 0.5 MG tablet Take 0.5 mg by mouth 4 (four) times daily. 10/29/20   [provider]  ANORO ELLIPTA 62.5-25 MCG/INH AEPB Inhale 1 puff into the lungs daily. 10/26/20   [provider]  aspirin 81 MG EC tablet Take 81 mg by mouth daily.    [provider]  atenolol (TENORMIN) 25 MG tablet Take 0.5 tablets (12.5 mg total) by mouth daily. NOTE DECREASE IN DOSE 01/10/21   Love, Evlyn Kanner, PA-C  Cholecalciferol (VITAMIN D3 PO) Take 1 tablet by mouth daily.    [provider]  Cyanocobalamin (VITAMIN B12 PO) Take 1 tablet by mouth daily.    [provider]  docusate sodium (COLACE) 100 MG capsule Take 1 capsule (100 mg total) by mouth daily. 01/11/21    Love, Evlyn Kanner, PA-C  FLUoxetine (PROZAC) 40 MG capsule Take 40 mg by mouth daily.    [provider]  lidocaine (LIDODERM) 5 % Place 1 patch onto the skin daily. Apply at lower back a 8 pm and remove at 8 am  daily. You can purchase them over the counter. 01/10/21   Love, Evlyn Kanner, PA-C  trolamine salicylate (SM ARTHRICREAM RUB) 10 % cream Apply 1 application topically 3 (three) times daily. 01/10/21   Love, Evlyn Kanner, PA-C  nicotine (NICODERM CQ - DOSED IN MG/24 HOURS) 14 mg/24hr patch Place 1 patch (14 mg total) onto the skin daily. 01/11/21   Love, Evlyn Kanner, PA-C  Omega-3 Fatty Acids (FISH OIL PO) Take 1 capsule by mouth daily.    [provider]  senna-docusate (SENOKOT-S) 8.6-50 MG tablet Take 2 tablets by mouth at bedtime. 01/10/21   Love, Evlyn Kanner, PA-C  simvastatin (ZOCOR) 40 MG tablet Take 40 mg by mouth at bedtime. 10/26/20   [provider]  traZODone (DESYREL) 50 MG tablet Take 50 mg by mouth at bedtime. 06/26/20   [provider]  triazolam (HALCION) 0.25 MG tablet Take 0.25 mg by mouth at bedtime. 06/26/20   [provider]    Allergies    Patient has no known allergies.  Review of Systems   Review of Systems  Neurological:  Positive for weakness.  All other systems reviewed and are negative.  Physical Exam Updated Vital Signs BP 115/79 (BP Location: Right Arm)   Pulse (!) 59   Temp 97.6 F (36.4 C) (Oral)   Resp 18   Ht 5\' 11"  (1.803 m)   Wt 79.4 kg   SpO2 93%   BMI 24.41 kg/m   Physical Exam Vitals and nursing note reviewed.  Constitutional:      General: He is not in acute distress.    Appearance: He is well-developed.  HENT:     Head: Normocephalic and atraumatic.     Mouth/Throat:     Mouth: Mucous membranes are dry.  Eyes:     Conjunctiva/sclera: Conjunctivae normal.     Pupils: Pupils are equal, round, and reactive to light.  Cardiovascular:     Rate and Rhythm: Normal rate and regular rhythm.     Pulses:  Normal pulses.     Heart sounds: No murmur heard. Pulmonary:     Effort: Pulmonary effort is normal. No respiratory distress.     Breath sounds: Normal breath sounds. No wheezing or rales.  Abdominal:     General: There is no distension.     Palpations: Abdomen is soft.     Tenderness: There is  no abdominal tenderness. There is no guarding or rebound.  Musculoskeletal:        General: No tenderness. Normal range of motion.     Cervical back: Normal range of motion and neck supple. No tenderness.     Right lower leg: No edema.     Left lower leg: No edema.  Skin:    General: Skin is warm and dry.     Capillary Refill: Capillary refill takes less than 2 seconds.     Findings: No erythema or rash.  Neurological:     General: No focal deficit present.     Mental Status: He is alert and oriented to person, place, and time. Mental status is at baseline.     Sensory: Sensation is intact. No sensory deficit.     Motor: Motor function is intact. No weakness.     Coordination: Coordination is intact. Heel to Shin Test normal.     Comments: No pronator drift  Psychiatric:        Mood and Affect: Mood normal.        Behavior: Behavior normal.    ED Results / Procedures / Treatments   Labs (all labs ordered are listed, but only abnormal results are displayed) Labs Reviewed  CBC WITH DIFFERENTIAL/PLATELET - Abnormal; Notable for the following components:      Result Value   HCT 38.8 (*)    Platelets 137 (*)    Monocytes Absolute 1.2 (*)    All other components within normal limits  COMPREHENSIVE METABOLIC PANEL - Abnormal; Notable for the following components:   BUN 24 (*)    Creatinine, Ser 1.39 (*)    Albumin 3.2 (*)    GFR, Estimated 54 (*)    All other components within normal limits    EKG EKG Interpretation  Date/Time:  Saturday May 11 2021 17:13:40 EDT Ventricular Rate:  59 PR Interval:  201 QRS Duration: 147 QT Interval:  463 QTC Calculation: 459 R  Axis:   -11 Text Interpretation: Sinus rhythm Right bundle branch block No significant change since last tracing Confirmed by Gwyneth Sprout ([id number]) on 05/11/2021 5:58:40 PM  Radiology CT Head Wo Contrast  Result Date: 05/11/2021 CLINICAL DATA:  Neuro deficit, acute, stroke suspected EXAM: CT HEAD WITHOUT CONTRAST TECHNIQUE: Contiguous axial images were obtained from the base of the skull through the vertex without intravenous contrast. COMPARISON:  None. FINDINGS: Brain: There is atrophy and chronic small vessel disease changes. No acute intracranial abnormality. Specifically, no hemorrhage, hydrocephalus, mass lesion, acute infarction, or significant intracranial injury. Vascular: No hyperdense vessel or unexpected calcification. Skull: No acute calvarial abnormality. Sinuses/Orbits: No acute findings Other: None IMPRESSION: Atrophy, chronic microvascular disease. No acute intracranial abnormality. Electronically Signed   By: Charlett Nose M.D.   On: 05/11/2021 18:08    Procedures Procedures   Medications Ordered in ED Medications - No data to display  ED Course  I have reviewed the triage vital signs and the nursing notes.  Pertinent labs & imaging results that were available during my care of the patient were reviewed by me and considered in my medical decision making (see chart for details).    MDM Rules/Calculators/A&P                           Patient is a 75 year old male presenting today with some generalized weakness in association with starting an antibiotic and being told he had a mild pneumonia and UTI.  Patient denies having any issues at this time.  However his wife reports he has had multiple falls and they were concerned that something else may be going on.  He is well-appearing on exam.  Appears to have normal strength in his legs bilaterally without pronator drift normal heel-to-shin and no notable pronator drift.  Patient CBC is within normal limits and CMP with stable  creatinine and BUN.  Head CT is negative for acute findings.  He denies any cough and denies feeling short of breath.  Vital signs are reassuring.  He recently had a chest x-ray at his doctor's office and a urine which they reported he had a mild UTI and pneumonia.  Lower suspicion for PE at this time and he does not have symptoms consistent with CHF.  EKG without acute findings and low suspicion for ACS today.  Concerned that the Levaquin may be causing some of his symptoms of fatigue, generalized weakness.  He is not displaying signs of sepsis at this time.  Will ensure patient is able to ambulate here.  7:08 PM Blood work and imaging is at baseline and no acute issues today.  Patient was able to ambulate here.  No distinct findings consistent with infection at this time.  Recommended holding the Levaquin until they follow-up with her doctor on Monday due to concern that this may be causing some of his symptoms.  MDM   Amount and/or Complexity of Data Reviewed Clinical lab tests: ordered and reviewed Tests in the radiology section of CPT: ordered and reviewed Tests in the medicine section of CPT: ordered and reviewed Independent visualization of images, tracings, or specimens: yes     Final Clinical Impression(s) / ED Diagnoses Final diagnoses:  Weakness    Rx / DC Orders ED Discharge Orders     None        Gwyneth Sprout, MD 05/11/21 1910

## 2021-05-11 NOTE — Discharge Instructions (Signed)
For the time being stop taking the antibiotic till you see your doctor on Monday.  Continue to eat and drink well.  Get rest when you need it.  If you develop confusion, inability to move 1 side of your body, high fever, shortness of breath, pain in your chest or abdomen return to the emergency room.

## 2021-05-11 NOTE — ED Notes (Signed)
Pt ambulated in hallway w/o difficulty 

## 2021-05-11 NOTE — ED Triage Notes (Signed)
Pt dx with UTI and PNA 2 days ago.  Reports that weakness continues.  Pt ambulatory per his norm.  Right sided weakness from prior CVA.  Pt has been taking levaquin x 2 days.

## 2021-05-11 NOTE — ED Notes (Signed)
Patient transported to CT 

## 2021-06-27 ENCOUNTER — Encounter: Payer: Self-pay | Admitting: Neurology

## 2021-06-27 ENCOUNTER — Ambulatory Visit: Payer: Medicare Other | Admitting: Neurology

## 2022-05-05 DIAGNOSIS — M51369 Other intervertebral disc degeneration, lumbar region without mention of lumbar back pain or lower extremity pain: Secondary | ICD-10-CM | POA: Insufficient documentation

## 2022-05-05 DIAGNOSIS — M47816 Spondylosis without myelopathy or radiculopathy, lumbar region: Secondary | ICD-10-CM | POA: Insufficient documentation

## 2022-05-05 DIAGNOSIS — M545 Low back pain, unspecified: Secondary | ICD-10-CM | POA: Insufficient documentation

## 2022-05-20 ENCOUNTER — Encounter: Payer: Self-pay | Admitting: Neurology

## 2022-05-20 ENCOUNTER — Telehealth: Payer: Self-pay | Admitting: Neurology

## 2022-05-20 ENCOUNTER — Ambulatory Visit (INDEPENDENT_AMBULATORY_CARE_PROVIDER_SITE_OTHER): Payer: Medicare Other | Admitting: Neurology

## 2022-05-20 VITALS — BP 113/72 | HR 58 | Ht 71.0 in | Wt 185.4 lb

## 2022-05-20 DIAGNOSIS — R29898 Other symptoms and signs involving the musculoskeletal system: Secondary | ICD-10-CM

## 2022-05-20 DIAGNOSIS — E538 Deficiency of other specified B group vitamins: Secondary | ICD-10-CM

## 2022-05-20 DIAGNOSIS — W19XXXA Unspecified fall, initial encounter: Secondary | ICD-10-CM | POA: Diagnosis not present

## 2022-05-20 DIAGNOSIS — E519 Thiamine deficiency, unspecified: Secondary | ICD-10-CM

## 2022-05-20 DIAGNOSIS — G8929 Other chronic pain: Secondary | ICD-10-CM

## 2022-05-20 DIAGNOSIS — M4712 Other spondylosis with myelopathy, cervical region: Secondary | ICD-10-CM | POA: Diagnosis not present

## 2022-05-20 DIAGNOSIS — M542 Cervicalgia: Secondary | ICD-10-CM | POA: Diagnosis not present

## 2022-05-20 DIAGNOSIS — R2689 Other abnormalities of gait and mobility: Secondary | ICD-10-CM | POA: Diagnosis not present

## 2022-05-20 NOTE — Progress Notes (Signed)
[facility]    Provider:  Dr Lucia GaskinsAhern Requesting Provider: Perley JainBulla, Donald, PA-C Primary Care Provider:  [facility]  CC:  weakness  HPI:  Juan BimlerWilliam Price Brewer is a 76 y.o. male here as requested by Doreen SalvageBulla, Donald, PA-C for hx of TIA, weakness. PMHx chronic kidney disease, stroke, constipation, dyslipidemia, thrombocytopenia, tobacco abuse, right hemiparesis, TIA, hypertension, tobacco abuse, polypharmacy on medications such as trazodone and Xanax that can cause sedation, white matter disease, acute back pain with chronic radicular low back pain, COPD, anxiety, high cholesterol, vitamin D deficiency, panic attacks, neuropathy.  [facility], last seen in August 2023.  Patient was not using his walker, has had therapy at home 2 days, he slipped without a severe injury, he has back pain, stiffness, back pain was slowly improving, the fall resulted from loss of balance, was preceded by unilateral leg weakness on the right side, symptoms including recurrent falls, episodic dizziness, impaired balance and transient leg weakness, but no confusion lightheadedness or vertigo or visual problems.  Symptoms are relieved by use of a walker and use of a cane.  The leg that was weak is also the leg that was involved in the stroke.  Here with his wife, she provides lots of information. He had an MRi of his lumbar spine a week ago and sees him for low back. He is very weak on his right side. He has been going to PT/OT and he is improving 3-4 months could walk but has regressed a little bit but has someone come to the house 2x a week for therapy. No new weakness since the stroke, ongoing since 2022.He is still smoking. The falls are mostly due to the right leg weakness he buckles and goes straight, more weakness and some imbalance. No sensory changes in the feet or numbness or tingling. Also not using his walking aid often. Occ neck pain and stiffness. No radicular arm  symptoms. We discussed all the causes of his likely mactifactorial causes of his walking problems (see A/P)    Reviewed notes, labs and imaging from outside physicians, which showed:  Hgba1c 5.6 2022 TSH normal  MRI brain 12/02/2020: TECHNIQUE: Multiplanar, multiecho pulse sequences of the brain and surrounding structures were obtained without intravenous contrast.   COMPARISON:  Prior CTs from 12/21/2020.   FINDINGS: Brain: Generalized age-related cerebral atrophy. Patchy and confluent T2/FLAIR hyperintensity within the periventricular deep white matter both cerebral hemispheres most consistent with chronic small vessel ischemic disease, moderate in nature.   Approximate 1.5 cm curvilinear focus of restricted diffusion seen involving the posterior left lentiform nucleus/corona radiata (series 5, images 78, 80), consistent with a small acute perforator type infarct. No associated hemorrhage or mass effect. No other evidence for acute or subacute ischemia. Gray-white matter differentiation otherwise maintained. No other areas of encephalomalacia to suggest chronic cortical infarction. No acute intracranial hemorrhage. Single punctate chronic microhemorrhage noted at the parasagittal left occipital region, likely small vessel related.   No mass lesion, midline shift or mass effect. No hydrocephalus or extra-axial fluid collection. Pituitary gland suprasellar region normal. Midline structures intact.   Vascular: Major intracranial vascular flow voids are maintained.   Skull and upper cervical spine: Craniocervical junction within normal limits. Bone marrow signal intensity within normal limits. No scalp soft tissue abnormality.   Sinuses/Orbits: Patient status post bilateral ocular lens replacement. Globes and orbital soft tissues demonstrate no acute finding. Moderate mucosal thickening noted within the ethmoidal air cells and maxillary sinuses. Paranasal sinuses  are  otherwise clear. No significant mastoid effusion. Inner ear structures grossly normal.   Other: None.   IMPRESSION: 1. 1.5 cm acute perforator type infarct involving the posterior left lentiform nucleus/corona radiata. No associated hemorrhage or mass effect. 2. Underlying age-related cerebral atrophy with moderate chronic microvascular ischemic disease.  MRI lubar spine 05/12/2022: IMPRESSION:  1. 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 Degeneration is diffusely  progressed from 2003.  2. L4-5 right subarticular recess impingement. There is better canal  patency towards the left due to prior left laminotomy.  3. Spinal stenosis that is moderate at L5-S1 and mild at L3-4.   Review of Systems: Patient complains of symptoms per HPI as well as the following symptoms back pain. Pertinent negatives and positives per HPI. All others negative.   Social History   Socioeconomic History   Marital status: Legally Separated    Spouse name: Not on file   Number of children: Not on file   Years of education: Not on file   Highest education level: Not on file  Occupational History   Not on file  Tobacco Use   Smoking status: Every Day    Packs/day: 1.00    Types: Cigarettes   Smokeless tobacco: Never  Vaping Use   Vaping Use: Never used  Substance and Sexual Activity   Alcohol use: Never   Drug use: Never   Sexual activity: Not on file  Other Topics Concern   Not on file  Social History Narrative   Not on file   Social Determinants of Health   Financial Resource Strain: Not on file  Food Insecurity: Not on file  Transportation Needs: Not on file  Physical Activity: Not on file  Stress: Not on file  Social Connections: Not on file  Intimate Partner Violence: Not on file    Family History  Problem Relation Age of Onset   Dementia Father    Parkinson's disease Father    Healthy Brother    Stroke Neg Hx     Neuropathy Neg Hx     Past Medical History:  Diagnosis Date   Hypertension    Stroke (cerebrum) [facility])     Patient Active Problem List   Diagnosis Date Noted   Chronic kidney disease 01/09/2021   Constipation 01/09/2021   CVA (cerebral vascular accident) (HCC) 12/26/2020   Dyslipidemia    Thrombocytopenia (HCC)    Tobacco abuse    Right hemiparesis (HCC)    Acute CVA (cerebrovascular accident) (HCC) 12/22/2020   TIA (transient ischemic attack) 12/21/2020   Essential hypertension 12/21/2020    Past Surgical History:  Procedure Laterality Date   CHOLECYSTECTOMY      Current Outpatient Medications  Medication Sig Dispense Refill   acetaminophen (TYLENOL) 325 MG tablet Take 3 tablets (975 mg total) by mouth 2 (two) times daily.     ALPRAZolam (XANAX) 0.5 MG tablet Take 0.5 mg by mouth 4 (four) times daily.     ANORO ELLIPTA 62.5-25 MCG/INH AEPB Inhale 1 puff into the lungs daily.     aspirin 81 MG EC tablet Take 81 mg by mouth daily.     atenolol (TENORMIN) 25 MG tablet Take 0.5 tablets (12.5 mg total) by mouth daily. NOTE DECREASE IN DOSE     Cholecalciferol (VITAMIN D3 PO) Take 1 tablet by mouth daily.     Cyanocobalamin (VITAMIN B12 PO) Take 1 tablet by mouth daily.     docusate sodium (COLACE) 100 MG  capsule Take 1 capsule (100 mg total) by mouth daily. 30 capsule 0   FLUoxetine (PROZAC) 40 MG capsule Take 40 mg by mouth daily.     lidocaine (LIDODERM) 5 % Place 1 patch onto the skin daily. Apply at lower back a 8 pm and remove at 8 am  daily. You can purchase them over the counter. 30 patch 0   nicotine (NICODERM CQ - DOSED IN MG/24 HOURS) 14 mg/24hr patch Place 1 patch (14 mg total) onto the skin daily. 28 patch 0   Omega-3 Fatty Acids (FISH OIL PO) Take 1 capsule by mouth daily.     senna-docusate (SENOKOT-S) 8.6-50 MG tablet Take 2 tablets by mouth at bedtime. 60 tablet 0   simvastatin (ZOCOR) 40 MG tablet Take 40 mg by mouth at bedtime.     traZODone (DESYREL) 50 MG  tablet Take 50 mg by mouth at bedtime.     triazolam (HALCION) 0.25 MG tablet Take 0.25 mg by mouth at bedtime.     trolamine salicylate (SM ARTHRICREAM RUB) 10 % cream Apply 1 application topically 3 (three) times daily. 85 g 0   No current facility-administered medications for this visit.    Allergies as of 05/20/2022   (No Known Allergies)    Vitals: BP 113/72   Pulse (!) 58   Ht 5\' 11"  (1.803 m)   Wt 185 lb 6.4 oz (84.1 kg)   BMI 25.86 kg/m  Last Weight:  Wt Readings from Last 1 Encounters:  05/20/22 185 lb 6.4 oz (84.1 kg)   Last Height:   Ht Readings from Last 1 Encounters:  05/20/22 5\' 11"  (1.803 m)     Physical exam: Exam: Gen: NAD, conversant, well nourised, obese, well groomed                     CV: RRR, no MRG. No Carotid Bruits. No peripheral edema, warm, nontender but distal bruising and rubor along the ankles Eyes: Conjunctivae clear without exudates or hemorrhage  Neuro: Detailed Neurologic Exam  Speech:    Speech is normal; fluent and spontaneous with normal comprehension.  Cognition:    The patient is oriented to person, place, and time;     recent and remote memory intact;     language fluent;     normal attention, concentration,     fund of knowledge Cranial Nerves:    The pupils are equal, round, and reactive to light. Attempted fundoscopy pupils too small to visualize Visual fields are full to finger confrontation. Extraocular movements are intact.decreased pin prick right face . The face is symmetric. The palate elevates in the midline. Hearing intact. Voice is normal. Shoulder shrug is normal. The tongue has normal motion without fasciculations.   Coordination:    Normal finger to nose and heel to shin.   Gait:    Can get up independently, can walk across the room, not spastic gait, slightly careful and slow but not ataxic  Motor Observation:    No asymmetry, no atrophy, and no involuntary movements noted. Tone:    Normal muscle tone.     Posture:    Posture is normal. normal erect    Strength: mild weakness throughout right leg otherwise strong. Strength is V/V in the upper and lower limbs.      Sensation: decreased pin prick right face. But intact pp and vibration and proprioception distally in the feet     Reflex Exam:  DTR's:    Deep tendon reflexes  in the upper and lower extremities are brisk on the right.  Toes:    The toes are downgoing bilaterally.   Clonus:    Clonus is absent.    Assessment/Plan:  76 y.o. male here as requested by Doreen Salvage, PA-C for hx of TIA, weakness. PMHx chronic kidney disease, stroke, constipation, dyslipidemia, thrombocytopenia, tobacco abuse, right hemiparesis, TIA, hypertension, tobacco abuse, polypharmacy on medications such as trazodone and Xanax that can cause sedation, white matter disease, acute back pain with chronic radicular low back pain, COPD, anxiety, high cholesterol, vitamin D deficiency, panic attacks, neuropathy.  I reviewed [facility], last seen in August 2023.  Patient was not using his walker, has had therapy at home 2 days, he slipped without a severe injury, he has back pain, stiffness, back pain was slowly improving, the fall resulted from loss of balance, was preceded by unilateral leg weakness on the right side, symptoms including recurrent falls, episodic dizziness, impaired balance and transient leg weakness, but no confusion lightheadedness or vertigo or visual problems.  Symptoms are relieved by use of a walker and use of a cane.  The leg that was weak is also the leg that was involved in the stroke. Sensory exam distally in the feet normal.  -Patient is very complicated, falls are multifactorial, he does not use his walker regularly, he has had a stroke with hemiparesis, he has chronic low back pain(moderate spinal stenosis see report), he has fallen due to right leg weakness which is the side of his stroke, he also has extensive white matter  changes in the brain that can cause gait apraxia(Cerebral Small Vessel Disease).  At this point medical management and conservative measures likely the treatment  But We will check some labs for him today however I do not think the "neuropathy?"(No sensory changes in the feet or numbness or tingling- Sensory exam distally in the feet normal.).  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  We will order MRI of the cervical spine to ensure no cervical myelopathy or stenosis like in his back which could contributing his imbalance and falls.He had an MRi of his lumbar spine a week ago and sees him for low back(see below)  (IMPRESSION: MRi lumbar spine recently 1. 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 Degeneration is diffusely  progressed from 2003.  2. L4-5 right subarticular recess impingement. There is better canal  patency towards the left due to prior left laminotomy.  3. Spinal stenosis that is moderate at L5-S1 and mild at L3-4. )  Orders Placed This Encounter  Procedures   MR CERVICAL SPINE WO CONTRAST   B12 and Folate Panel   Vitamin B1   Methylmalonic acid, serum    Cc: Bulla, Opal Sidles,  [facility]  Naomie Dean, MD  [facility] [address] [place], [place] [address]  Phone [phone] Fax [phone]  I spent over 60  minutes of face-to-face and non-face-to-face time with patient on the  1. Fall, initial encounter   2. Imbalance   3. Chronic neck pain   4. 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 Decreased range of motion of neck   6. screen for B12 deficiency   7. screen for Vitamin B1 deficiency    diagnosis.  This included previsit chart review, lab review, study review, order entry, electronic health record documentation, patient education on the different diagnostic and therapeutic options, counseling and coordination  of care,  risks and benefits of management, compliance, or risk factor reduction

## 2022-05-20 NOTE — Telephone Encounter (Signed)
medicare/BCBS sup NPR sent to GI [phone] 

## 2022-05-20 NOTE — Patient Instructions (Addendum)
MRI cervical spine Blood work  Cabin crew in the Home, Adult Falls can cause injuries and affect people of all ages. There are many simple things that you can do to make your home safe and to help prevent falls. Ask for help when making these changes, if needed. What actions can I take to prevent falls? General instructions Use good lighting in all rooms. Replace any light bulbs that burn out, turn on lights if it is dark, and use night-lights. Place frequently used items in easy-to-reach places. Lower the shelves around your home if necessary. Set up furniture so that there are clear paths around it. Avoid moving your furniture around. Remove throw rugs and other tripping hazards from the floor. Avoid walking on wet floors. Fix any uneven floor surfaces. Add color or contrast paint or tape to grab bars and handrails in your home. Place contrasting color strips on the first and last steps of staircases. When you use a stepladder, make sure that it is completely opened and that the sides and supports are firmly locked. Have someone hold the ladder while you are using it. Do not climb a closed stepladder. Know where your pets are when moving through your home. What can I do in the bathroom?     Keep the floor dry. Immediately clean up any water that is on the floor. Remove soap buildup in the tub or shower regularly. Use nonskid mats or decals on the floor of the tub or shower. Attach bath mats securely with double-sided, nonslip rug tape. If you need to sit down while you are in the shower, use a plastic, nonslip stool. Install grab bars by the toilet and in the tub and shower. Do not use towel bars as grab bars. What can I do in the bedroom? Make sure that a bedside light is easy to reach. Do not use oversized bedding that reaches the floor. Have a firm chair that has side arms to use for getting dressed. What can I do in the kitchen? Clean up any spills right away. If you need  to reach for something above you, use a sturdy step stool that has a grab bar. Keep electrical cables out of the way. Do not use floor polish or wax that makes floors slippery. If you must use wax, make sure that it is non-skid floor wax. What can I do with my stairs? Do not leave any items on the stairs. Make sure that you have a light switch at the top and the bottom of the stairs. Have them installed if you do not have them. Make sure that there are handrails on both sides of the stairs. Fix handrails that are broken or loose. Make sure that handrails are as long as the staircases. Install non-slip stair treads on all stairs in your home. Avoid having throw rugs at the top or bottom of stairs, or secure the rugs with carpet tape to prevent them from moving. Choose a carpet design that does not hide the edge of steps on the stairs. Check any carpeting to make sure that it is firmly attached to the stairs. Fix any carpet that is loose or worn. What can I do on the outside of my home? Use bright outdoor lighting. Regularly repair the edges of walkways and driveways and fix any cracks. Remove high doorway thresholds. Trim any shrubbery on the main path into your home. Regularly check that handrails are securely fastened and in good repair. Both sides of all steps  should have handrails. Install guardrails along the edges of any raised decks or porches. Clear walkways of debris and clutter, including tools and rocks. Have leaves, snow, and ice cleared regularly. Use sand or salt on walkways during winter months. In the garage, clean up any spills right away, including grease or oil spills. What other actions can I take? Wear closed-toe shoes that fit well and support your feet. Wear shoes that have rubber soles or low heels. Use mobility aids as needed, such as canes, walkers, scooters, and crutches. Review your medicines with your health care provider. Some medicines can cause dizziness or  changes in blood pressure, which increase your risk of falling. Talk with your health care provider about other ways that you can decrease your risk of falls. This may include working with a physical therapist or trainer to improve your strength, balance, and endurance. Where to find more information Centers for Disease Control and Prevention, [employer]: http://www.wolf.info/ National Institute on Aging: http://kim-miller.com/ Contact a health care provider if: You are afraid of falling at home. You feel weak, drowsy, or dizzy at home. You fall at home. Summary There are many simple things that you can do to make your home safe and to help prevent falls. Ways to make your home safe include removing tripping hazards and installing grab bars in the bathroom. Ask for help when making these changes in your home. This information is not intended to replace advice given to you by your health care provider. Make sure you discuss any questions you have with your health care provider. Document Revised: 04/15/2021 Document Reviewed: 02/15/2020 Elsevier Patient Education  [place].

## 2022-05-26 ENCOUNTER — Telehealth: Payer: Self-pay | Admitting: *Deleted

## 2022-05-26 LAB — B12 AND FOLATE PANEL
Folate: 9.7 ng/mL (ref >3.0)
Vitamin B-12: 796 pg/mL (ref 232–1245)

## 2022-05-26 LAB — VITAMIN B1: Thiamine: 145.5 nmol/L (ref 66.5–200.0)

## 2022-05-26 LAB — METHYLMALONIC ACID, SERUM: Methylmalonic Acid: 251 nmol/L (ref 0–378)

## 2022-05-26 NOTE — Telephone Encounter (Signed)
Called pt & LVM asking for call back. When the patient calls back, please let him know his blood work is normal.

## 2022-05-26 NOTE — Telephone Encounter (Signed)
-----   Message from Antonia B Ahern, MD sent at 05/26/2022 10:28 AM EDT ----- Blood work normal thanks 

## 2022-05-27 NOTE — Telephone Encounter (Signed)
Pt called and was informed of the results. Pt verbalized appreciation.

## 2022-05-28 ENCOUNTER — Telehealth: Payer: Self-pay | Admitting: *Deleted

## 2022-05-28 NOTE — Telephone Encounter (Signed)
LMVM for pt that lab work results came back normal.  He is to callback if questions.

## 2022-05-28 NOTE — Telephone Encounter (Signed)
-----   Message from Melvenia Beam, MD sent at 05/26/2022 10:28 AM EDT ----- Blood work normal thanks

## 2022-06-01 ENCOUNTER — Ambulatory Visit
Admission: RE | Admit: 2022-06-01 | Discharge: 2022-06-01 | Disposition: A | Discharge status: Home / Self Care | Payer: Medicare Other | Source: Ambulatory Visit | Attending: Neurology | Admitting: Neurology

## 2022-06-01 DIAGNOSIS — G8929 Other chronic pain: Secondary | ICD-10-CM

## 2022-06-01 DIAGNOSIS — M542 Cervicalgia: Secondary | ICD-10-CM

## 2022-06-01 DIAGNOSIS — W19XXXA Unspecified fall, initial encounter: Secondary | ICD-10-CM

## 2022-06-01 DIAGNOSIS — R29898 Other symptoms and signs involving the musculoskeletal system: Secondary | ICD-10-CM

## 2022-06-01 DIAGNOSIS — R2689 Other abnormalities of gait and mobility: Secondary | ICD-10-CM

## 2022-06-01 DIAGNOSIS — M4712 Other spondylosis with myelopathy, cervical region: Secondary | ICD-10-CM

## 2022-06-02 ENCOUNTER — Other Ambulatory Visit (HOSPITAL_BASED_OUTPATIENT_CLINIC_OR_DEPARTMENT_OTHER): Payer: Self-pay

## 2022-06-03 ENCOUNTER — Telehealth: Payer: Self-pay | Admitting: *Deleted

## 2022-06-03 DIAGNOSIS — M4802 Spinal stenosis, cervical region: Secondary | ICD-10-CM

## 2022-06-03 NOTE — Telephone Encounter (Signed)
Patient returned call. I advised him per Dr Jaynee Eagles he has arthritic changes in his cervical spine, but at 1 level he does have moderate spinal stenosis.  This could be a cause for his falls or may be an incidental finding.  In any case she'd like to send him to neurosurgery for evaluation of moderate spinal stenosis.  If he is amenable to going and talking to a surgeon and getting their opinion then please refer him to [place] [facility] Dr. Arnoldo Morale please.  Unless he is seen another surgeon in the past then please refer them there. He stated he has not seen another surgeon, will go where Dr Jaynee Eagles recommends. I advised a referral will be made and, he should get a call to schedule from their office. He  verbalized understanding, appreciation.

## 2022-06-03 NOTE — Telephone Encounter (Signed)
Called patient on work #, call could not be completed. Called home #, LVM requesting call back for MRI results.

## 2022-06-04 ENCOUNTER — Telehealth: Payer: Self-pay | Admitting: Neurology

## 2022-06-04 DIAGNOSIS — M47812 Spondylosis without myelopathy or radiculopathy, cervical region: Secondary | ICD-10-CM | POA: Insufficient documentation

## 2022-06-04 NOTE — Telephone Encounter (Signed)
Referral sent to Dr. Jenkins at [facility], phone # [phone]. 

## 2022-06-04 NOTE — Telephone Encounter (Signed)
Received call from wife on DPR, discussed MRI results and Dr Trevor Mace recommendations and referral to Dr Lovell Sheehan. She verbalized understanding, appreciation.

## 2022-06-22 DIAGNOSIS — W19XXXA Unspecified fall, initial encounter: Secondary | ICD-10-CM | POA: Insufficient documentation

## 2022-06-25 DIAGNOSIS — R5381 Other malaise: Secondary | ICD-10-CM | POA: Insufficient documentation

## 2022-06-25 DIAGNOSIS — R296 Repeated falls: Secondary | ICD-10-CM | POA: Insufficient documentation

## 2022-06-25 DIAGNOSIS — J189 Pneumonia, unspecified organism: Secondary | ICD-10-CM | POA: Insufficient documentation

## 2022-06-25 DIAGNOSIS — I951 Orthostatic hypotension: Secondary | ICD-10-CM | POA: Insufficient documentation

## 2022-06-25 DIAGNOSIS — J449 Chronic obstructive pulmonary disease, unspecified: Secondary | ICD-10-CM | POA: Insufficient documentation

## 2022-06-25 DIAGNOSIS — Z8673 Personal history of transient ischemic attack (TIA), and cerebral infarction without residual deficits: Secondary | ICD-10-CM | POA: Insufficient documentation

## 2022-07-11 DIAGNOSIS — M5416 Radiculopathy, lumbar region: Secondary | ICD-10-CM | POA: Insufficient documentation

## 2022-11-18 ENCOUNTER — Other Ambulatory Visit: Payer: Self-pay | Admitting: Neurosurgery

## 2022-11-26 ENCOUNTER — Other Ambulatory Visit: Payer: Self-pay | Admitting: Neurosurgery

## 2022-12-16 NOTE — Pre-Procedure Instructions (Signed)
Surgical Instructions    Your procedure is scheduled on Dec 25, 2022.  Report to [facility] Main Entrance "A" at 10:00 A.M., then check in with the Admitting office.  Call this number if you have problems the morning of surgery:  [phone]  If you have any questions prior to your surgery date call [phone]: Open Monday-Friday 8am-4pm If you experience any cold or flu symptoms such as cough, fever, chills, shortness of breath, etc. between now and your scheduled surgery, please notify us at the above number.     Remember:  Do not eat after midnight the night before your surgery  You may drink clear liquids until 9:00 AM the morning of your surgery.   Clear liquids allowed are: Water, Non-Citrus Juices (without pulp), Carbonated Beverages, Clear Tea, Black Coffee Only (NO MILK, CREAM OR POWDERED CREAMER of any kind), and Gatorade.     Take these medicines the morning of surgery with A SIP OF WATER:  ALPRAZolam Prudy Feeler)   Fluticasone-Umeclidin-Vilant (TRELEGY ELLIPTA) inhaler   Follow your surgeon's instructions on when to stop clopidogrel (PLAVIX).  If no instructions were given by your surgeon then you will need to call the office to get those instructions.     As of today, STOP taking any Aspirin (unless otherwise instructed by your surgeon) Aleve, Naproxen, Ibuprofen, Motrin, Advil, Goody's, BC's, all herbal medications, fish oil, and all vitamins.                     Do NOT Smoke (Tobacco/Vaping) for 24 hours prior to your procedure.  If you use a CPAP at night, you may bring your mask/headgear for your overnight stay.   Contacts, glasses, piercing's, hearing aid's, dentures or partials may not be worn into surgery, please bring cases for these belongings.    For patients admitted to the hospital, discharge time will be determined by your treatment team.   Patients discharged the day of surgery will not be allowed to drive home, and someone needs to stay with them for 24  hours.  SURGICAL WAITING ROOM VISITATION Patients having surgery or a procedure may have no more than 2 support people in the waiting area - these visitors may rotate.   Children under the age of 76 must have an adult with them who is not the patient. If the patient needs to stay at the hospital during part of their recovery, the visitor guidelines for inpatient rooms apply. Pre-op nurse will coordinate an appropriate time for 1 support person to accompany patient in pre-op.  This support person may not rotate.   Please refer to the [facility] website for the visitor guidelines for Inpatients (after your surgery is over and you are in a regular room).    Special instructions:   [facility]- Preparing For Surgery  Before surgery, you can play an important role. Because skin is not sterile, your skin needs to be as free of germs as possible. You can reduce the number of germs on your skin by washing with CHG (chlorahexidine gluconate) Soap before surgery.  CHG is an antiseptic cleaner which kills germs and bonds with the skin to continue killing germs even after washing.    Oral Hygiene is also important to reduce your risk of infection.  Remember - BRUSH YOUR TEETH THE MORNING OF SURGERY WITH YOUR REGULAR TOOTHPASTE  Please do not use if you have an allergy to CHG or antibacterial soaps. If your skin becomes reddened/irritated stop using the CHG.  Do not shave (including legs and underarms) for at least 48 hours prior to first CHG shower. It is OK to shave your face.  Please follow these instructions carefully.   Shower the NIGHT BEFORE SURGERY and the MORNING OF SURGERY  If you chose to wash your hair, wash your hair first as usual with your normal shampoo.  After you shampoo, rinse your hair and body thoroughly to remove the shampoo.  Use CHG Soap as you would any other liquid soap. You can apply CHG directly to the skin and wash gently with a scrungie or a clean washcloth.   Apply  the CHG Soap to your body ONLY FROM THE NECK DOWN.  Do not use on open wounds or open sores. Avoid contact with your eyes, ears, mouth and genitals (private parts). Wash Face and genitals (private parts)  with your normal soap.   Wash thoroughly, paying special attention to the area where your surgery will be performed.  Thoroughly rinse your body with warm water from the neck down.  DO NOT shower/wash with your normal soap after using and rinsing off the CHG Soap.  Pat yourself dry with a CLEAN TOWEL.  Wear CLEAN PAJAMAS to bed the night before surgery  Place CLEAN SHEETS on your bed the night before your surgery  DO NOT SLEEP WITH PETS.   Day of Surgery: Take a shower with CHG soap. Do not wear jewelry or makeup Do not wear lotions, powders, perfumes/colognes, or deodorant. Do not shave 48 hours prior to surgery.  Men may shave face and neck. Do not bring valuables to the hospital.  [facility] is not responsible for any belongings or valuables. Do not wear nail polish, gel polish, artificial nails, or any other type of covering on natural nails (fingers and toes) If you have artificial nails or gel coating that need to be removed by a nail salon, please have this removed prior to surgery. Artificial nails or gel coating may interfere with anesthesia's ability to adequately monitor your vital signs.  Wear Clean/Comfortable clothing the morning of surgery Remember to brush your teeth WITH YOUR REGULAR TOOTHPASTE.   Please read over the following fact sheets that you were given.    If you received a COVID test during your pre-op visit  it is requested that you wear a mask when out in public, stay away from anyone that may not be feeling well and notify your surgeon if you develop symptoms. If you have been in contact with anyone that has tested positive in the last 10 days please notify you surgeon.

## 2022-12-17 ENCOUNTER — Other Ambulatory Visit: Payer: Self-pay

## 2022-12-17 ENCOUNTER — Encounter (HOSPITAL_COMMUNITY)
Admission: RE | Admit: 2022-12-17 | Discharge: 2022-12-17 | Disposition: A | Discharge status: Home / Self Care | Payer: Medicare Other | Source: Ambulatory Visit | Attending: Neurosurgery | Admitting: Neurosurgery

## 2022-12-17 ENCOUNTER — Encounter (HOSPITAL_COMMUNITY): Payer: Self-pay

## 2022-12-17 VITALS — BP 151/85 | HR 83 | Temp 97.4°F | Resp 18 | Ht 71.0 in | Wt 179.3 lb

## 2022-12-17 DIAGNOSIS — J449 Chronic obstructive pulmonary disease, unspecified: Secondary | ICD-10-CM | POA: Diagnosis not present

## 2022-12-17 DIAGNOSIS — F17210 Nicotine dependence, cigarettes, uncomplicated: Secondary | ICD-10-CM | POA: Insufficient documentation

## 2022-12-17 DIAGNOSIS — Z79899 Other long term (current) drug therapy: Secondary | ICD-10-CM | POA: Diagnosis not present

## 2022-12-17 DIAGNOSIS — I129 Hypertensive chronic kidney disease with stage 1 through stage 4 chronic kidney disease, or unspecified chronic kidney disease: Secondary | ICD-10-CM | POA: Insufficient documentation

## 2022-12-17 DIAGNOSIS — D696 Thrombocytopenia, unspecified: Secondary | ICD-10-CM | POA: Insufficient documentation

## 2022-12-17 DIAGNOSIS — R9082 White matter disease, unspecified: Secondary | ICD-10-CM | POA: Diagnosis not present

## 2022-12-17 DIAGNOSIS — I69351 Hemiplegia and hemiparesis following cerebral infarction affecting right dominant side: Secondary | ICD-10-CM | POA: Diagnosis not present

## 2022-12-17 DIAGNOSIS — I10 Essential (primary) hypertension: Secondary | ICD-10-CM

## 2022-12-17 DIAGNOSIS — Z01812 Encounter for preprocedural laboratory examination: Secondary | ICD-10-CM | POA: Insufficient documentation

## 2022-12-17 DIAGNOSIS — Z7902 Long term (current) use of antithrombotics/antiplatelets: Secondary | ICD-10-CM | POA: Diagnosis not present

## 2022-12-17 DIAGNOSIS — N183 Chronic kidney disease, stage 3 unspecified: Secondary | ICD-10-CM | POA: Diagnosis not present

## 2022-12-17 DIAGNOSIS — Z01818 Encounter for other preprocedural examination: Secondary | ICD-10-CM

## 2022-12-17 HISTORY — DX: Unspecified osteoarthritis, unspecified site: M19.90

## 2022-12-17 LAB — TYPE AND SCREEN
ABO/RH(D): A NEG
Antibody Screen: NEGATIVE

## 2022-12-17 LAB — CBC
HCT: 42.1 % (ref 39.0–52.0)
Hemoglobin: 14.6 g/dL (ref 13.0–17.0)
MCH: 31.9 pg (ref 26.0–34.0)
MCHC: 34.7 g/dL (ref 30.0–36.0)
MCV: 91.9 fL (ref 80.0–100.0)
Platelets: 102 10*3/uL — ABNORMAL LOW (ref 150–400)
RBC: 4.58 MIL/uL (ref 4.22–5.81)
RDW: 13.2 % (ref 11.5–15.5)
WBC: 13.6 10*3/uL — ABNORMAL HIGH (ref 4.0–10.5)
nRBC: 0 % (ref 0.0–0.2)

## 2022-12-17 LAB — BASIC METABOLIC PANEL
Anion gap: 10 (ref 5–15)
BUN: 23 mg/dL (ref 8–23)
CO2: 24 mmol/L (ref 22–32)
Calcium: 10.6 mg/dL — ABNORMAL HIGH (ref 8.9–10.3)
Chloride: 105 mmol/L (ref 98–111)
Creatinine, Ser: 1.28 mg/dL — ABNORMAL HIGH (ref 0.61–1.24)
GFR, Estimated: 59 mL/min — ABNORMAL LOW (ref >60)
Glucose, Bld: 168 mg/dL — ABNORMAL HIGH (ref 70–99)
Potassium: 3.8 mmol/L (ref 3.5–5.1)
Sodium: 139 mmol/L (ref 135–145)

## 2022-12-17 NOTE — Progress Notes (Signed)
PCP - Zollie Pee, PA-C [facility] Medical) Cardiologist - denies  PPM/ICD - denies   Chest x-ray - 06/22/22 EKG - 06/25/22- tracing requested  Stress Test - denies ECHO - 12/22/20 Cardiac Cath - denies  Sleep Study - denies   DM- denies  Blood Thinner Instructions: Hold Plavix 5 days prior to surgery. Last dose 5/25 (per instructions from surgeon) Aspirin Instructions: n/a  ERAS Protcol - yes, no drink PRE-SURGERY Ensure or G2-   COVID TEST- n/a   Anesthesia review: yes, records requested  Patient denies shortness of breath, fever, cough and chest pain at PAT appointment   All instructions explained to the patient, with a verbal understanding of the material. Patient agrees to go over the instructions while at home for a better understanding.  The opportunity to ask questions was provided.

## 2022-12-18 NOTE — Progress Notes (Signed)
Anesthesia Chart Review:  77 year old male with pertinent history including CVA with residual right hemiparesis, HTN, COPD, white matter disease, CKD 3, tobacco abuse, thrombocytopenia.  Patient had CVA June 2022 which was felt secondary to small vessel disease.  Continues to follow with neurologist Dr. Lucia Gaskins.  Last seen 05/20/2022 for discussion of increasing falls.  Etiology was felt to be multifactorial.  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  He was referred to neurosurgeon Dr. Lovell Sheehan for further evaluation.  Patient reports last dose Plavix 12/20/2022.  Current smoker with associated COPD, maintained on Trelegy Ellipta.  Went to a concert with history of CKD, 2, otherwise unremarkable.  EKG 06/25/2022 (Care Everywhere, tracing requested): Sinus rhythm.  Rate 68.  Right bundle branch block.  MR C-spine 06/01/2022: IMPRESSION: This MRI of the cervical spine without contrast shows the following: The spinal cord appears normal. At C4-C5, there is mild spinal stenosis and mild bilateral foraminal narrowing but no nerve root compression. At C5-C6, there is mild spinal stenosis and left greater than right foraminal narrowing but no nerve root compression. At C6-C7, there is moderate spinal stenosis (AP diameter 6.5 mm) due to a left paramedian disc protrusion and other degenerative change including advanced ligamenta flava hypertrophy.  There is moderate left foraminal narrowing but no definite nerve root compression. At C2-C3, C3-C4 and C7-T1, there are degenerative changes but no spinal stenosis or nerve root compression  TTE 12/22/2020:  1. Left ventricular ejection fraction, by estimation, is 60 to 65%. The  left ventricle has normal function. The left ventricle has no regional  wall motion abnormalities. Left ventricular diastolic parameters are  indeterminate.   2. Right ventricular systolic function is normal. The right ventricular  size is normal.    3. The mitral valve is normal in structure. No evidence of mitral valve  regurgitation. No evidence of mitral stenosis.   4. The aortic valve is normal in structure. Aortic valve regurgitation is  not visualized. No aortic stenosis is present.   5. The inferior vena cava is normal in size with greater than 50%  respiratory variability, suggesting right atrial pressure of 3 mmHg.   Conclusion(s)/Recommendation(s): No intracardiac source of embolism  detected on this transthoracic study. A transesophageal echocardiogram is  recommended to exclude cardiac source of embolism if clinically indicated.  Antionette Poles, PA-C [facility]/Anesthesiology Phone [phone] 12/18/2022 2:01 PM

## 2022-12-18 NOTE — Anesthesia Preprocedure Evaluation (Addendum)
Anesthesia Evaluation  Patient identified by MRN, date of birth, ID band Patient awake    Reviewed: Allergy & Precautions, NPO status , Patient's Chart, lab work & pertinent test results  Airway Mallampati: III  TM Distance: >3 FB Neck ROM: Full    Dental  (+) Dental Advisory Given   Pulmonary Current Smoker and Patient abstained from smoking.   breath sounds clear to auscultation       Cardiovascular hypertension, Pt. on medications  Rhythm:Regular  '22 ECHO: EF 60- 65%.  1.The LV has normal function, no regional wall motion abnormalities.    2. Right ventricular systolic function is normal. The right ventricular size is normal.   3. The mitral valve is normal in structure. No evidence of MR. No evidence of mitral stenosis.   4. The aortic valve is normal in structure. AI is not visualized. No AS is present.     Neuro/Psych Communicating hydrocephalus CVA (R hemiparesis), Residual Symptoms    GI/Hepatic   Endo/Other    Renal/GU Renal InsufficiencyRenal diseaseLab Results      Component                Value               Date                      CREATININE               1.28 (H)            12/17/2022            Lab Results      Component                Value               Date                      K                        3.8                 12/17/2022                Musculoskeletal  (+) Arthritis ,    Abdominal   Peds  Hematology   Anesthesia Other Findings   Reproductive/Obstetrics                             Anesthesia Physical Anesthesia Plan  ASA: 3  Anesthesia Plan: General   Post-op Pain Management: Tylenol PO (pre-op)*   Induction: Intravenous  PONV Risk Score and Plan: 1 and Ondansetron and Dexamethasone  Airway Management Planned: Oral ETT  Additional Equipment: None  Intra-op Plan:   Post-operative Plan: Extubation in OR  Informed Consent: I have reviewed  the patients History and Physical, chart, labs and discussed the procedure including the risks, benefits and alternatives for the proposed anesthesia with the patient or authorized representative who has indicated his/her understanding and acceptance.     Dental advisory given  Plan Discussed with: CRNA  Anesthesia Plan Comments: (PAT note by Antionette Poles, PA-C: 77 year old male with pertinent history including CVA with residual right hemiparesis, HTN, COPD, white matter disease, CKD 3, tobacco abuse, thrombocytopenia.  Patient had CVA June 2022 which was felt secondary to small vessel disease.  Continues to  follow with neurologist Dr. Lucia Gaskins.  Last seen 05/20/2022 for discussion of increasing falls.  Etiology was felt to be multifactorial.  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  He was referred to neurosurgeon Dr. Lovell Sheehan for further evaluation.  Patient reports last dose Plavix 12/20/2022.  Current smoker with associated COPD, maintained on Trelegy Ellipta.  Went to a concert with history of CKD, 2, otherwise unremarkable.  EKG 06/25/2022 (Care Everywhere, tracing requested): Sinus rhythm.  Rate 68.  Right bundle branch block.  MR C-spine 06/01/2022: IMPRESSION: This MRI of the cervical spine without contrast shows the following: 1. The spinal cord appears normal. 2. At C4-C5, there is mild spinal stenosis and mild bilateral foraminal narrowing but no nerve root compression. 3. At C5-C6, there is mild spinal stenosis and left greater than right foraminal narrowing but no nerve root compression. 4. At C6-C7, there is moderate spinal stenosis (AP diameter 6.5 mm) due to a left paramedian disc protrusion and other degenerative change including advanced ligamenta flava hypertrophy.  There is moderate left foraminal narrowing but no definite nerve root compression. 5. At C2-C3, C3-C4 and C7-T1, there are degenerative changes but no spinal stenosis or nerve root  compression  TTE 12/22/2020: 1. Left ventricular ejection fraction, by estimation, is 60 to 65%. The  left ventricle has normal function. The left ventricle has no regional  wall motion abnormalities. Left ventricular diastolic parameters are  indeterminate.  2. Right ventricular systolic function is normal. The right ventricular  size is normal.  3. The mitral valve is normal in structure. No evidence of mitral valve  regurgitation. No evidence of mitral stenosis.  4. The aortic valve is normal in structure. Aortic valve regurgitation is  not visualized. No aortic stenosis is present.  5. The inferior vena cava is normal in size with greater than 50%  respiratory variability, suggesting right atrial pressure of 3 mmHg.   Conclusion(s)/Recommendation(s): No intracardiac source of embolism  detected on this transthoracic study. A transesophageal echocardiogram is  recommended to exclude cardiac source of embolism if clinically indicated.  [name]   )        Anesthesia Quick Evaluation

## 2022-12-25 ENCOUNTER — Inpatient Hospital Stay (HOSPITAL_COMMUNITY): Payer: Medicare Other | Admitting: Physician Assistant

## 2022-12-25 ENCOUNTER — Other Ambulatory Visit: Payer: Self-pay

## 2022-12-25 ENCOUNTER — Inpatient Hospital Stay (HOSPITAL_COMMUNITY)
Admission: RE | Admit: 2022-12-25 | Discharge: 2022-12-26 | DRG: 032 | Disposition: A | Discharge status: Home / Self Care | Payer: Medicare Other | Attending: Neurosurgery | Admitting: Neurosurgery

## 2022-12-25 ENCOUNTER — Encounter (HOSPITAL_COMMUNITY): Payer: Self-pay | Admitting: Neurosurgery

## 2022-12-25 ENCOUNTER — Inpatient Hospital Stay (HOSPITAL_COMMUNITY): Payer: Medicare Other

## 2022-12-25 ENCOUNTER — Inpatient Hospital Stay (HOSPITAL_COMMUNITY)
Admission: RE | Disposition: A | Discharge status: Home / Self Care | Payer: Self-pay | Source: Home / Self Care | Attending: Neurosurgery

## 2022-12-25 DIAGNOSIS — I69351 Hemiplegia and hemiparesis following cerebral infarction affecting right dominant side: Secondary | ICD-10-CM | POA: Diagnosis not present

## 2022-12-25 DIAGNOSIS — I10 Essential (primary) hypertension: Secondary | ICD-10-CM

## 2022-12-25 DIAGNOSIS — G319 Degenerative disease of nervous system, unspecified: Secondary | ICD-10-CM | POA: Diagnosis present

## 2022-12-25 DIAGNOSIS — F17210 Nicotine dependence, cigarettes, uncomplicated: Secondary | ICD-10-CM | POA: Diagnosis not present

## 2022-12-25 DIAGNOSIS — G911 Obstructive hydrocephalus: Secondary | ICD-10-CM | POA: Diagnosis not present

## 2022-12-25 DIAGNOSIS — Z820 Family history of epilepsy and other diseases of the nervous system: Secondary | ICD-10-CM | POA: Diagnosis not present

## 2022-12-25 DIAGNOSIS — Z7902 Long term (current) use of antithrombotics/antiplatelets: Secondary | ICD-10-CM | POA: Diagnosis not present

## 2022-12-25 DIAGNOSIS — Z79899 Other long term (current) drug therapy: Secondary | ICD-10-CM

## 2022-12-25 DIAGNOSIS — G912 (Idiopathic) normal pressure hydrocephalus: Principal | ICD-10-CM | POA: Diagnosis present

## 2022-12-25 DIAGNOSIS — Z9049 Acquired absence of other specified parts of digestive tract: Secondary | ICD-10-CM | POA: Diagnosis not present

## 2022-12-25 DIAGNOSIS — N289 Disorder of kidney and ureter, unspecified: Secondary | ICD-10-CM

## 2022-12-25 HISTORY — PX: VENTRICULOPERITONEAL SHUNT: SHX204

## 2022-12-25 LAB — ABO/RH: ABO/RH(D): A NEG

## 2022-12-25 SURGERY — SHUNT INSERTION VENTRICULAR-PERITONEAL
Anesthesia: General | Site: Head | Laterality: Right

## 2022-12-25 MED ORDER — LIDOCAINE 2% (20 MG/ML) 5 ML SYRINGE
INTRAMUSCULAR | Status: DC | PRN
  Administered 2022-12-25: 40 mg via INTRAVENOUS

## 2022-12-25 MED ORDER — SUGAMMADEX SODIUM 200 MG/2ML IV SOLN
INTRAVENOUS | Status: DC | PRN
  Administered 2022-12-25: 200 mg via INTRAVENOUS

## 2022-12-25 MED ORDER — PROMETHAZINE HCL 25 MG/ML IJ SOLN: 6.2500 mg | INTRAMUSCULAR | Status: DC | PRN

## 2022-12-25 MED ORDER — CHLORHEXIDINE GLUCONATE CLOTH 2 % EX PADS: 6.0000 | MEDICATED_PAD | Freq: Once | CUTANEOUS | Status: DC

## 2022-12-25 MED ORDER — ALPRAZOLAM 0.5 MG PO TABS
0.5000 mg | ORAL_TABLET | Freq: Four times a day (QID) | ORAL | Status: DC
  Administered 2022-12-25: 0.5 mg via ORAL
  Filled 2022-12-25: qty 1

## 2022-12-25 MED ORDER — ESMOLOL HCL 100 MG/10ML IV SOLN
INTRAVENOUS | Status: AC
  Filled 2022-12-25: qty 10

## 2022-12-25 MED ORDER — ORAL CARE MOUTH RINSE: 15.0000 mL | Freq: Once | OROMUCOSAL | Status: AC

## 2022-12-25 MED ORDER — SODIUM CHLORIDE 0.9 % IV SOLN: INTRAVENOUS | Status: DC

## 2022-12-25 MED ORDER — THROMBIN 5000 UNITS EX SOLR
CUTANEOUS | Status: AC
  Filled 2022-12-25: qty 5000

## 2022-12-25 MED ORDER — EPHEDRINE 5 MG/ML INJ
INTRAVENOUS | Status: AC
  Filled 2022-12-25: qty 5

## 2022-12-25 MED ORDER — FENTANYL CITRATE (PF) 250 MCG/5ML IJ SOLN
INTRAMUSCULAR | Status: AC
  Filled 2022-12-25: qty 5

## 2022-12-25 MED ORDER — ALPRAZOLAM 0.5 MG PO TABS
0.5000 mg | ORAL_TABLET | Freq: Three times a day (TID) | ORAL | Status: DC
  Administered 2022-12-26 (×2): 0.5 mg via ORAL
  Filled 2022-12-25 (×2): qty 1

## 2022-12-25 MED ORDER — LABETALOL HCL 5 MG/ML IV SOLN
INTRAVENOUS | Status: AC
  Filled 2022-12-25: qty 4

## 2022-12-25 MED ORDER — HYDROMORPHONE HCL 1 MG/ML IJ SOLN
INTRAMUSCULAR | Status: AC
  Filled 2022-12-25: qty 1

## 2022-12-25 MED ORDER — LABETALOL HCL 5 MG/ML IV SOLN
10.0000 mg | INTRAVENOUS | Status: DC | PRN
  Administered 2022-12-25: 10 mg via INTRAVENOUS
  Filled 2022-12-25: qty 4

## 2022-12-25 MED ORDER — ACETAMINOPHEN 325 MG PO TABS
650.0000 mg | ORAL_TABLET | ORAL | Status: DC | PRN
  Administered 2022-12-26: 650 mg via ORAL
  Filled 2022-12-25: qty 2

## 2022-12-25 MED ORDER — ONDANSETRON HCL 4 MG/2ML IJ SOLN: 4.0000 mg | INTRAMUSCULAR | Status: DC | PRN

## 2022-12-25 MED ORDER — CHLORHEXIDINE GLUCONATE 0.12 % MT SOLN
15.0000 mL | Freq: Once | OROMUCOSAL | Status: AC
  Administered 2022-12-25: 15 mL via OROMUCOSAL
  Filled 2022-12-25: qty 15

## 2022-12-25 MED ORDER — OXYCODONE HCL 5 MG PO TABS: 5.0000 mg | ORAL_TABLET | Freq: Once | ORAL | Status: DC | PRN

## 2022-12-25 MED ORDER — FLUTICASONE FUROATE-VILANTEROL 100-25 MCG/ACT IN AEPB
1.0000 | INHALATION_SPRAY | Freq: Every day | RESPIRATORY_TRACT | Status: DC
  Administered 2022-12-26: 1 via RESPIRATORY_TRACT
  Filled 2022-12-25: qty 28

## 2022-12-25 MED ORDER — DEXAMETHASONE SODIUM PHOSPHATE 10 MG/ML IJ SOLN
INTRAMUSCULAR | Status: AC
  Filled 2022-12-25: qty 1

## 2022-12-25 MED ORDER — ACETAMINOPHEN 500 MG PO TABS
1000.0000 mg | ORAL_TABLET | Freq: Once | ORAL | Status: AC
  Administered 2022-12-25: 1000 mg via ORAL
  Filled 2022-12-25: qty 2

## 2022-12-25 MED ORDER — PANTOPRAZOLE SODIUM 40 MG IV SOLR
40.0000 mg | Freq: Every day | INTRAVENOUS | Status: DC
  Administered 2022-12-25: 40 mg via INTRAVENOUS
  Filled 2022-12-25: qty 10

## 2022-12-25 MED ORDER — HYDROCODONE-ACETAMINOPHEN 5-325 MG PO TABS
1.0000 | ORAL_TABLET | ORAL | Status: DC | PRN
  Administered 2022-12-25: 1 via ORAL
  Filled 2022-12-25: qty 1

## 2022-12-25 MED ORDER — THROMBIN (RECOMBINANT) 5000 UNITS EX SOLR: CUTANEOUS | Status: DC | PRN

## 2022-12-25 MED ORDER — CEFAZOLIN SODIUM-DEXTROSE 2-4 GM/100ML-% IV SOLN
2.0000 g | Freq: Three times a day (TID) | INTRAVENOUS | Status: AC
  Administered 2022-12-25 – 2022-12-26 (×2): 2 g via INTRAVENOUS
  Filled 2022-12-25 (×2): qty 100

## 2022-12-25 MED ORDER — ACETAMINOPHEN 650 MG RE SUPP: 650.0000 mg | RECTAL | Status: DC | PRN

## 2022-12-25 MED ORDER — PROPOFOL 10 MG/ML IV BOLUS
INTRAVENOUS | Status: DC | PRN
  Administered 2022-12-25: 90 mg via INTRAVENOUS
  Administered 2022-12-25: 30 mg via INTRAVENOUS
  Administered 2022-12-25 (×2): 40 mg via INTRAVENOUS

## 2022-12-25 MED ORDER — EPHEDRINE SULFATE-NACL 50-0.9 MG/10ML-% IV SOSY
PREFILLED_SYRINGE | INTRAVENOUS | Status: DC | PRN
  Administered 2022-12-25: 10 mg via INTRAVENOUS
  Administered 2022-12-25 (×2): 7.5 mg via INTRAVENOUS

## 2022-12-25 MED ORDER — ROCURONIUM BROMIDE 10 MG/ML (PF) SYRINGE
PREFILLED_SYRINGE | INTRAVENOUS | Status: DC | PRN
  Administered 2022-12-25: 10 mg via INTRAVENOUS
  Administered 2022-12-25: 60 mg via INTRAVENOUS

## 2022-12-25 MED ORDER — PROMETHAZINE HCL 12.5 MG PO TABS: 12.5000 mg | ORAL_TABLET | ORAL | Status: DC | PRN

## 2022-12-25 MED ORDER — BUPIVACAINE-EPINEPHRINE 0.5% -1:200000 IJ SOLN
INTRAMUSCULAR | Status: DC | PRN
  Administered 2022-12-25: 10 mL

## 2022-12-25 MED ORDER — HYDROMORPHONE HCL 1 MG/ML IJ SOLN
0.2500 mg | INTRAMUSCULAR | Status: DC | PRN
  Administered 2022-12-25: 0.25 mg via INTRAVENOUS
  Administered 2022-12-25: 0.5 mg via INTRAVENOUS
  Administered 2022-12-25: 0.25 mg via INTRAVENOUS

## 2022-12-25 MED ORDER — LABETALOL HCL 5 MG/ML IV SOLN
INTRAVENOUS | Status: DC | PRN
  Administered 2022-12-25: 5 mg via INTRAVENOUS
  Administered 2022-12-25 (×2): 7.5 mg via INTRAVENOUS

## 2022-12-25 MED ORDER — ONDANSETRON HCL 4 MG/2ML IJ SOLN
INTRAMUSCULAR | Status: DC | PRN
  Administered 2022-12-25: 4 mg via INTRAVENOUS

## 2022-12-25 MED ORDER — ONDANSETRON HCL 4 MG/2ML IJ SOLN
INTRAMUSCULAR | Status: AC
  Filled 2022-12-25: qty 2

## 2022-12-25 MED ORDER — ROCURONIUM BROMIDE 10 MG/ML (PF) SYRINGE
PREFILLED_SYRINGE | INTRAVENOUS | Status: AC
  Filled 2022-12-25: qty 10

## 2022-12-25 MED ORDER — MIDAZOLAM HCL 2 MG/2ML IJ SOLN: 0.5000 mg | Freq: Once | INTRAMUSCULAR | Status: DC | PRN

## 2022-12-25 MED ORDER — LOSARTAN POTASSIUM 50 MG PO TABS
100.0000 mg | ORAL_TABLET | Freq: Every day | ORAL | Status: DC
  Administered 2022-12-25 – 2022-12-26 (×2): 100 mg via ORAL
  Filled 2022-12-25 (×3): qty 2

## 2022-12-25 MED ORDER — FLUTICASONE FUROATE-VILANTEROL 100-25 MCG/ACT IN AEPB: 1.0000 | INHALATION_SPRAY | Freq: Every day | RESPIRATORY_TRACT | Status: DC

## 2022-12-25 MED ORDER — 0.9 % SODIUM CHLORIDE (POUR BTL) OPTIME
TOPICAL | Status: DC | PRN
  Administered 2022-12-25: 1000 mL

## 2022-12-25 MED ORDER — LACTATED RINGERS IV SOLN: INTRAVENOUS | Status: DC

## 2022-12-25 MED ORDER — LIDOCAINE 2% (20 MG/ML) 5 ML SYRINGE
INTRAMUSCULAR | Status: AC
  Filled 2022-12-25: qty 5

## 2022-12-25 MED ORDER — FENTANYL CITRATE (PF) 250 MCG/5ML IJ SOLN
INTRAMUSCULAR | Status: DC | PRN
  Administered 2022-12-25: 50 ug via INTRAVENOUS
  Administered 2022-12-25: 100 ug via INTRAVENOUS

## 2022-12-25 MED ORDER — POTASSIUM CHLORIDE IN NACL 20-0.9 MEQ/L-% IV SOLN
INTRAVENOUS | Status: DC
  Filled 2022-12-25: qty 1000

## 2022-12-25 MED ORDER — UMECLIDINIUM BROMIDE 62.5 MCG/ACT IN AEPB
1.0000 | INHALATION_SPRAY | Freq: Every day | RESPIRATORY_TRACT | Status: DC
  Administered 2022-12-26: 1 via RESPIRATORY_TRACT
  Filled 2022-12-25: qty 7

## 2022-12-25 MED ORDER — IBUPROFEN 200 MG PO TABS
200.0000 mg | ORAL_TABLET | Freq: Three times a day (TID) | ORAL | Status: DC | PRN
  Administered 2022-12-26: 400 mg via ORAL
  Filled 2022-12-25: qty 2

## 2022-12-25 MED ORDER — BUPIVACAINE-EPINEPHRINE (PF) 0.5% -1:200000 IJ SOLN
INTRAMUSCULAR | Status: AC
  Filled 2022-12-25: qty 30

## 2022-12-25 MED ORDER — PHENYLEPHRINE 80 MCG/ML (10ML) SYRINGE FOR IV PUSH (FOR BLOOD PRESSURE SUPPORT)
PREFILLED_SYRINGE | INTRAVENOUS | Status: AC
  Filled 2022-12-25: qty 10

## 2022-12-25 MED ORDER — SIMVASTATIN 20 MG PO TABS
40.0000 mg | ORAL_TABLET | Freq: Every day | ORAL | Status: DC
  Administered 2022-12-25: 40 mg via ORAL
  Filled 2022-12-25: qty 2

## 2022-12-25 MED ORDER — MORPHINE SULFATE (PF) 2 MG/ML IV SOLN
2.0000 mg | INTRAVENOUS | Status: DC | PRN
  Administered 2022-12-25 – 2022-12-26 (×2): 2 mg via INTRAVENOUS
  Filled 2022-12-25 (×2): qty 1

## 2022-12-25 MED ORDER — OXYCODONE HCL 5 MG/5ML PO SOLN: 5.0000 mg | Freq: Once | ORAL | Status: DC | PRN

## 2022-12-25 MED ORDER — PROPOFOL 10 MG/ML IV BOLUS
INTRAVENOUS | Status: AC
  Filled 2022-12-25: qty 20

## 2022-12-25 MED ORDER — CEFAZOLIN SODIUM-DEXTROSE 2-4 GM/100ML-% IV SOLN
2.0000 g | INTRAVENOUS | Status: AC
  Administered 2022-12-25: 2 g via INTRAVENOUS
  Filled 2022-12-25: qty 100

## 2022-12-25 MED ORDER — ONDANSETRON HCL 4 MG PO TABS: 4.0000 mg | ORAL_TABLET | ORAL | Status: DC | PRN

## 2022-12-25 MED ORDER — DOCUSATE SODIUM 100 MG PO CAPS
100.0000 mg | ORAL_CAPSULE | Freq: Two times a day (BID) | ORAL | Status: DC
  Administered 2022-12-25 – 2022-12-26 (×2): 100 mg via ORAL
  Filled 2022-12-25 (×2): qty 1

## 2022-12-25 MED ORDER — DEXAMETHASONE SODIUM PHOSPHATE 10 MG/ML IJ SOLN
INTRAMUSCULAR | Status: DC | PRN
  Administered 2022-12-25: 10 mg via INTRAVENOUS

## 2022-12-25 MED ORDER — UMECLIDINIUM BROMIDE 62.5 MCG/ACT IN AEPB: 1.0000 | INHALATION_SPRAY | Freq: Every day | RESPIRATORY_TRACT | Status: DC

## 2022-12-25 SURGICAL SUPPLY — 55 items
APL SKNCLS STERI-STRIP NONHPOA (GAUZE/BANDAGES/DRESSINGS) ×1
BAG COUNTER SPONGE SURGICOUNT (BAG) ×1 IMPLANT
BAG SPNG CNTER NS LX DISP (BAG) ×1
BENZOIN TINCTURE PRP APPL 2/3 (GAUZE/BANDAGES/DRESSINGS) ×1 IMPLANT
BLADE CLIPPER SURG (BLADE) ×2 IMPLANT
BOOT SUTURE VASCULAR YLW (MISCELLANEOUS) ×1
BUR PRECISION FLUTE 6.0 (BURR) ×1 IMPLANT
CANISTER SUCT 3000ML PPV (MISCELLANEOUS) ×1 IMPLANT
CLAMP SUTURE YELLOW 5 PAIRS (MISCELLANEOUS) ×1 IMPLANT
CLIP RANEY DISP (INSTRUMENTS) IMPLANT
DRAPE INCISE IOBAN 85X60 (DRAPES) ×1 IMPLANT
DRAPE ORTHO SPLIT 77X108 STRL (DRAPES) ×2
DRAPE SURG 17X23 STRL (DRAPES) ×6 IMPLANT
DRAPE SURG ORHT 6 SPLT 77X108 (DRAPES) ×2 IMPLANT
DRSG OPSITE POSTOP 4X6 (GAUZE/BANDAGES/DRESSINGS) IMPLANT
ELECT REM PT RETURN 9FT ADLT (ELECTROSURGICAL) ×1
ELECTRODE REM PT RTRN 9FT ADLT (ELECTROSURGICAL) ×1 IMPLANT
GAUZE 4X4 16PLY ~~LOC~~+RFID DBL (SPONGE) IMPLANT
GLOVE BIO SURGEON STRL SZ8 (GLOVE) ×1 IMPLANT
GLOVE BIO SURGEON STRL SZ8.5 (GLOVE) ×1 IMPLANT
GLOVE EXAM NITRILE XL STR (GLOVE) IMPLANT
GOWN STRL REUS W/ TWL LRG LVL3 (GOWN DISPOSABLE) IMPLANT
GOWN STRL REUS W/ TWL XL LVL3 (GOWN DISPOSABLE) IMPLANT
GOWN STRL REUS W/TWL LRG LVL3 (GOWN DISPOSABLE)
GOWN STRL REUS W/TWL XL LVL3 (GOWN DISPOSABLE)
KIT BASIN OR (CUSTOM PROCEDURE TRAY) ×1 IMPLANT
KIT TURNOVER KIT B (KITS) ×1 IMPLANT
NEEDLE HYPO 22GX1.5 SAFETY (NEEDLE) ×1 IMPLANT
NS IRRIG 1000ML POUR BTL (IV SOLUTION) ×1 IMPLANT
PACK LAMINECTOMY NEURO (CUSTOM PROCEDURE TRAY) ×1 IMPLANT
PAD ARMBOARD 7.5X6 YLW CONV (MISCELLANEOUS) ×3 IMPLANT
SHEATH PERITONEAL INTRO 46 (SHEATH) IMPLANT
SHEATH PERITONEAL INTRO 61 (SHEATH) IMPLANT
SOL ELECTROSURG ANTI STICK (MISCELLANEOUS) ×1
SOLUTION ELECTROSURG ANTI STCK (MISCELLANEOUS) ×1 IMPLANT
SPONGE SURGIFOAM ABS GEL SZ50 (HEMOSTASIS) ×1 IMPLANT
SPONGE T-LAP 4X18 ~~LOC~~+RFID (SPONGE) IMPLANT
STAPLER SKIN PROX WIDE 3.9 (STAPLE) ×1 IMPLANT
STRIP CLOSURE SKIN 1/2X4 (GAUZE/BANDAGES/DRESSINGS) ×1 IMPLANT
SUT ETHILON 3 0 PS 1 (SUTURE) IMPLANT
SUT NURALON 4 0 TR CR/8 (SUTURE) IMPLANT
SUT SILK 0 TIES 10X30 (SUTURE) IMPLANT
SUT SILK 3 0 SH 30 (SUTURE) ×1 IMPLANT
SUT VIC AB 1 CT1 18XBRD ANBCTR (SUTURE) IMPLANT
SUT VIC AB 1 CT1 8-18 (SUTURE)
SUT VIC AB 2-0 CP2 18 (SUTURE) ×1 IMPLANT
SUT VIC AB 3-0 SH 8-18 (SUTURE) ×1 IMPLANT
SYR CONTROL 10ML LL (SYRINGE) ×1 IMPLANT
TAG SUTURE CLAMP YLW 5PR (MISCELLANEOUS) ×1
TOWEL GREEN STERILE (TOWEL DISPOSABLE) ×1 IMPLANT
TOWEL GREEN STERILE FF (TOWEL DISPOSABLE) ×1 IMPLANT
TRAY FOLEY MTR SLVR 16FR STAT (SET/KITS/TRAYS/PACK) IMPLANT
UNDERPAD 30X36 HEAVY ABSORB (UNDERPADS AND DIAPERS) ×1 IMPLANT
VALVE RT ANGLE UNITIZE DIST (Valve) IMPLANT
WATER STERILE IRR 1000ML POUR (IV SOLUTION) ×1 IMPLANT

## 2022-12-25 NOTE — Op Note (Signed)
Brief history: The patient is a 77 year old white male who has complained of unsteady gait, urine incontinence and some memory difficulties consistent with normal pressure hydrocephalus.  He has failed medical management and was worked up with a head CT which demonstrated mild brain atrophy.  I discussed the various treatment options with him.  He has decided proceed with surgery.  Preop diagnosis: Normal pressure hydrocephalus  Postop diagnosis: The same  Procedure: Placement of Codman right retroauricular ventriculoperitoneal shunt initial pressure 80 mmHg)  Surgeon: Dr. Delma Officer  Assistant: None  Anesthesia: General tracheal  Estimated blood loss: 75 cc  Specimens: None  Drains: None  Complications: None  Description of procedure: The patient was brought to the operating room by the anesthesia team.  General endotracheal anesthesia was induced.  The patient remained in supine position.  A roll was placed under his right shoulder.  His head was turned to the left exposing his right scalp.  His right scalp was then shaved with clippers.  This region his neck upper thorax and the abdomen was prepared with Betadine scrub and Betadine solution.  Sterile drapes were applied.  I injected the area to be incised with Marcaine with epinephrine solution.  I used a scalpel to make an incision in the patient's right upper quadrant of his abdomen.  I used the wheat Hydrographic surveyor for exposure.  I dissected deeper and expose the anterior rectus sheath.  I divided with the Metzenbaum scissors and dissected down to the posterior rectus sheath.  I divided with Metzenbaum scissors.  I then identified the peritoneum and divided with scissors.  We entered into the peritoneal cavity.  I now turned my attention to the ventricular catheter.  He is a scalpel to make a incision behind the patient's right ear.  We used the cerebellar retractor for exposure.  I then used a shunt passer to pass the peritoneal  catheter from the scalp incision down through the abdominal incision.  I then used a high-speed drill to create a right parietal occipital bur hole.  I coagulated the underlying dura.  I then cannulated the patient's ventricular system with a ventriculostomy.  There was free flow of the CSF.  I removed the stylette.   I threaded the catheter a bit deeper and the spinal fluid flow stopped.  So I withdrew the catheter a bit and reestablish good flow spinal fluid.  I connected the catheter to the shunt valve.  I noticed there was debris in the shunt valve.  I tried to flush it out but it remained.  I therefore placed the valve and peritoneal catheter the new one.  I connected to the ventricular catheter.  There was good flow of CSF through the distal end of the ventricular catheter.  I placed a ventricular catheter in the peritoneum.  I then reapproximated patient's anterior rectus sheath with interrupted 0 Vicryl suture.  I then reapproximated his abdominal subcutaneous tissue with interrupted 2-0 Vicryl suture.  I reapproximated the skin with stainless steel staples.  I then remove the scalp retractor and reapproximated the patient's galea with interrupted 2-0 Vicryl suture.  The scalp skin with stainless steel staples.  The wounds were then coated with bacitracin ointment.  General dressings were applied.  The drapes were removed.  By report all sponge, instrument, and needle counts were correct at the end of this case.

## 2022-12-25 NOTE — H&P (Signed)
Subjective: The patient is a 77 year old white male who is complaining of symptoms consistent with neurogenic claudication.  He has failed medical management.  I discussed placement of a ventriculoperitoneal shunt.  The patient has decided to proceed with surgery.  Past Medical History:  Diagnosis Date   Arthritis    back   Hypertension    Stroke (cerebrum) [facility])     Past Surgical History:  Procedure Laterality Date   CHOLECYSTECTOMY     LUMBAR DISC SURGERY  1971    No Known Allergies  Social History   Tobacco Use   Smoking status: Every Day    Packs/day: 1    Types: Cigarettes   Smokeless tobacco: Never  Substance Use Topics   Alcohol use: Never    Family History  Problem Relation Age of Onset   Dementia Father    Parkinson's disease Father    Healthy Brother    Stroke Neg Hx    Neuropathy Neg Hx    Prior to Admission medications   Medication Sig Start Date End Date Taking? Authorizing Provider  ALPRAZolam Prudy Feeler) 0.5 MG tablet Take 0.5 mg by mouth 4 (four) times daily. 10/29/20  Yes [provider]  Ascorbic Acid (VITAMIN C PO) Take 1 tablet by mouth daily.   Yes [provider]  clopidogrel (PLAVIX) 75 MG tablet Take 75 mg by mouth daily. 07/07/22  Yes [provider]  Fluticasone-Umeclidin-Vilant (TRELEGY ELLIPTA) 100-62.5-25 MCG/ACT AEPB Inhale 1 puff into the lungs daily.   Yes [provider]  ibuprofen (ADVIL) 200 MG tablet Take 200-400 mg by mouth 3 (three) times daily as needed for moderate pain.   Yes [provider]  losartan (COZAAR) 100 MG tablet Take 100 mg by mouth daily. 10/07/22  Yes [provider]  Menthol-Methyl Salicylate (MUSCLE RUB) 10-15 % CREA Apply 1 Application topically as needed for muscle pain.   Yes [provider]  Multiple Vitamins-Minerals (ZINC PO) Take 1 tablet by mouth daily.   Yes [provider]  nicotine (NICODERM CQ - DOSED IN MG/24 HOURS) 14 mg/24hr patch Place 1  patch (14 mg total) onto the skin daily. 01/11/21  Yes Love, Evlyn Kanner, PA-C  simvastatin (ZOCOR) 40 MG tablet Take 40 mg by mouth at bedtime. 10/26/20  Yes [provider]  triazolam (HALCION) 0.25 MG tablet Take 0.25 mg by mouth at bedtime. 06/26/20  Yes [provider]     Review of Systems  Positive ROS: As above  All other systems have been reviewed and were otherwise negative with the exception of those mentioned in the HPI and as above.  Objective: Vital signs in last 24 hours: Temp:  [97.7 F (36.5 C)] 97.7 F (36.5 C) (05/30 1019) Pulse Rate:  [94] 94 (05/30 1019) Resp:  [18] 18 (05/30 1019) BP: (164)/(89) 164/89 (05/30 1019) SpO2:  [98 %] 98 % (05/30 1019) Weight:  [81.3 kg] 81.3 kg (05/30 1019) Estimated body mass index is 25 kg/m as calculated from the following:   Height as of this encounter: 5\' 11"  (1.803 m).   Weight as of this encounter: 81.3 kg.   General Appearance: Alert Head: Normocephalic, without obvious abnormality, atraumatic Eyes: PERRL, conjunctiva/corneas clear, EOM's intact,    Ears: Normal  Throat: Normal  Neck: Supple, Back: unremarkable Lungs: Clear to auscultation bilaterally, respirations unlabored Heart: Regular rate and rhythm, no murmur, rub or gallop Abdomen: Soft, non-tender Extremities: Extremities normal, atraumatic, no cyanosis or edema Skin: unremarkable  NEUROLOGIC:   Mental  status: alert and oriented,Motor Exam - grossly normal Sensory Exam - grossly normal Reflexes:  Coordination - grossly normal Gait -unsteady Balance - grossly normal Cranial Nerves: I: smell Not tested  II: visual acuity  OS: Normal  OD: Normal   II: visual fields Full to confrontation  II: pupils Equal, round, reactive to light  III,VII: ptosis None  III,IV,VI: extraocular muscles  Full ROM  V: mastication Normal  V: facial light touch sensation  Normal  V,VII: corneal reflex  Present  VII: facial muscle function - upper  Normal   VII: facial muscle function - lower Normal  VIII: hearing Not tested  IX: soft palate elevation  Normal  IX,X: gag reflex Present  XI: trapezius strength  5/5  XI: sternocleidomastoid strength 5/5  XI: neck flexion strength  5/5  XII: tongue strength  Normal    Data Review Lab Results  Component Value Date   WBC 13.6 (H) 12/17/2022   HGB 14.6 12/17/2022   HCT 42.1 12/17/2022   MCV 91.9 12/17/2022   PLT 102 (L) 12/17/2022   Lab Results  Component Value Date   NA 139 12/17/2022   K 3.8 12/17/2022   CL 105 12/17/2022   CO2 24 12/17/2022   BUN 23 12/17/2022   CREATININE 1.28 (H) 12/17/2022   GLUCOSE 168 (H) 12/17/2022   Lab Results  Component Value Date   INR 1.0 12/21/2020    Assessment/Plan: Normal pressure hydrocephalus: I have discussed situation with the patient and his wife.  I reviewed his imaging studies with him and pointed out the abnormalities.  We have discussed the various treatment options including surgery.  I have described the surgical treatment option of placement of a right ventriculoperitoneal shunt.  We discussed the procedure, the risk, benefits, alternatives, expected postoperative course, and likelihood of achieving our goals with surgery.  I have answered all his questions.  He has decided proceed with surgery.   Cristi Loron 12/25/2022 1:03 PM

## 2022-12-25 NOTE — Transfer of Care (Signed)
Immediate Anesthesia Transfer of Care Note  Patient: Juan Brewer  Procedure(s) Performed: RIGHT SHUNT INSERTION VENTRICULAR-PERITONEAL (Right: Head)  Patient Location: PACU  Anesthesia Type:General  Level of Consciousness: awake and oriented  Airway & Oxygen Therapy: Patient Spontanous Breathing and Patient connected to nasal cannula oxygen  Post-op Assessment: Report given to RN and Post -op Vital signs reviewed and stable  Post vital signs: stable  Last Vitals:  Vitals Value Taken Time  BP 172/86 12/25/22 1545  Temp 36.4 C 12/25/22 1536  Pulse 63 12/25/22 1548  Resp 17 12/25/22 1548  SpO2 97 % 12/25/22 1548  Vitals shown include unvalidated device data.  Last Pain:  Vitals:   12/25/22 1019  TempSrc: Oral         Complications: There were no known notable events for this encounter.

## 2022-12-25 NOTE — Anesthesia Procedure Notes (Signed)
Procedure Name: Intubation Date/Time: 12/25/2022 1:37 PM  Performed by: Darlina Guys, CRNAPre-anesthesia Checklist: Patient identified, Emergency Drugs available and Suction available Patient Re-evaluated:Patient Re-evaluated prior to induction Oxygen Delivery Method: Circle system utilized Preoxygenation: Pre-oxygenation with 100% oxygen Induction Type: IV induction Ventilation: Oral airway inserted - appropriate to patient size Laryngoscope Size: Glidescope and 3 Grade View: Grade I Tube type: Oral Tube size: 7.5 mm Number of attempts: 1 Airway Equipment and Method: Stylet Placement Confirmation: ETT inserted through vocal cords under direct vision, positive ETCO2 and breath sounds checked- equal and bilateral Secured at: 23 cm Tube secured with: Tape Dental Injury: Injury to lip

## 2022-12-25 NOTE — Progress Notes (Signed)
Pt admitted to 4NP-02 from PACU. A&O x 4 denies pain at this time. Belongings given to wife to take home cell phone remains at bedside. Assessment and VS documented. Pt oriented to unit. Call bell within reach, bed alarm in place.

## 2022-12-26 ENCOUNTER — Inpatient Hospital Stay (HOSPITAL_COMMUNITY): Payer: Medicare Other

## 2022-12-26 MED ORDER — DOCUSATE SODIUM 100 MG PO CAPS: 100.0000 mg | ORAL_CAPSULE | Freq: Two times a day (BID) | ORAL | 0 refills | Status: AC

## 2022-12-26 MED ORDER — HYDROCODONE-ACETAMINOPHEN 5-325 MG PO TABS: 1.0000 | ORAL_TABLET | ORAL | 0 refills | Status: DC | PRN

## 2022-12-26 MED FILL — Thrombin For Soln 5000 Unit: CUTANEOUS | Qty: 2 | Status: AC

## 2022-12-26 NOTE — Discharge Summary (Signed)
Physician Discharge Summary  Patient ID: Juan Brewer MRN: [id number] DOB/AGE: 1945/08/12 77 y.o.  Admit date: 12/25/2022 Discharge date: 12/26/2022  Admission Diagnoses: Normal pressure hydrocephalus  Discharge Diagnoses: The same Principal Problem:   (Idiopathic) normal pressure hydrocephalus (HCC)   Discharged Condition: good  Hospital Course: I placed a right retroauricular Codman ventriculoperitoneal shunt with initial pressure of 80 mmHg on the patient on 12/25/2022.  The surgery went well.  The patient postoperative course was unremarkable.  On postoperative day #1 his head scan looked good.  He felt well and requested discharge to home.  He was given verbal and written discharge instructions.  All his questions were answered.  Consults: PT Significant Diagnostic Studies: Head CT Treatments: Placement of right retroauricular common ventriculoperitoneal shunt with initial pressure of 80 mmHg. Discharge Exam: Blood pressure 133/76, pulse 74, temperature 97.8 F (36.6 C), temperature source Oral, resp. rate 13, height 5\' 11"  (1.803 m), weight 81.3 kg, SpO2 96 %. The patient is alert and pleasant.  He looks well.  His dressings are clean and dry.  His strength is normal.  His speech is normal.  Disposition: Home  Discharge Instructions     Call MD for:  difficulty breathing, headache or visual disturbances   Complete by: As directed    Call MD for:  extreme fatigue   Complete by: As directed    Call MD for:  hives   Complete by: As directed    Call MD for:  persistant dizziness or light-headedness   Complete by: As directed    Call MD for:  persistant nausea and vomiting   Complete by: As directed    Call MD for:  redness, tenderness, or signs of infection (pain, swelling, redness, odor or green/yellow discharge around incision site)   Complete by: As directed    Call MD for:  severe uncontrolled pain   Complete by: As directed    Call MD for:  temperature >100.4    Complete by: As directed    Diet - low sodium heart healthy   Complete by: As directed    Discharge instructions   Complete by: As directed    Call [phone] for a followup appointment. Take a stool softener while you are using pain medications.   Driving Restrictions   Complete by: As directed    Do not drive for 2 weeks.   Increase activity slowly   Complete by: As directed    Lifting restrictions   Complete by: As directed    Do not lift more than 5 pounds. No excessive bending or twisting.   May shower / Bathe   Complete by: As directed    Remove the dressing for 3 days after surgery.  You may shower, but leave the incision alone.   Remove dressing in 48 hours   Complete by: As directed       Allergies as of 12/26/2022   No Known Allergies      Medication List     TAKE these medications    ALPRAZolam 0.5 MG tablet Commonly known as: XANAX Take 0.5 mg by mouth 4 (four) times daily.   clopidogrel 75 MG tablet Commonly known as: PLAVIX Take 75 mg by mouth daily.   docusate sodium 100 MG capsule Commonly known as: COLACE Take 1 capsule (100 mg total) by mouth 2 (two) times daily.   HYDROcodone-acetaminophen 5-325 MG tablet Commonly known as: NORCO/VICODIN Take 1 tablet by mouth every 4 (four) hours as needed for  moderate pain.   ibuprofen 200 MG tablet Commonly known as: ADVIL Take 200-400 mg by mouth 3 (three) times daily as needed for moderate pain.   losartan 100 MG tablet Commonly known as: COZAAR Take 100 mg by mouth daily.   Muscle Rub 10-15 % Crea Apply 1 Application topically as needed for muscle pain.   nicotine 14 mg/24hr patch Commonly known as: NICODERM CQ - dosed in mg/24 hours Place 1 patch (14 mg total) onto the skin daily.   simvastatin 40 MG tablet Commonly known as: ZOCOR Take 40 mg by mouth at bedtime.   Trelegy Ellipta 100-62.5-25 MCG/ACT Aepb Generic drug: Fluticasone-Umeclidin-Vilant Inhale 1 puff into the lungs daily.    triazolam 0.25 MG tablet Commonly known as: HALCION Take 0.25 mg by mouth at bedtime.   VITAMIN C PO Take 1 tablet by mouth daily.   ZINC PO Take 1 tablet by mouth daily.         Signed: Cristi Loron 12/26/2022, 6:46 AM

## 2022-12-27 ENCOUNTER — Encounter (HOSPITAL_COMMUNITY): Payer: Self-pay | Admitting: Neurosurgery

## 2022-12-27 NOTE — Anesthesia Postprocedure Evaluation (Signed)
Anesthesia Post Note  Patient: Juan Brewer  Procedure(s) Performed: RIGHT SHUNT INSERTION VENTRICULAR-PERITONEAL (Right: Head)     Patient location during evaluation: PACU Anesthesia Type: General Level of consciousness: awake and alert Pain management: pain level controlled Vital Signs Assessment: post-procedure vital signs reviewed and stable Respiratory status: spontaneous breathing, nonlabored ventilation and respiratory function stable Cardiovascular status: blood pressure returned to baseline and stable Postop Assessment: no apparent nausea or vomiting Anesthetic complications: no   There were no known notable events for this encounter.  Last Vitals:  Vitals:   12/26/22 0757 12/26/22 0811  BP:  128/82  Pulse:  80  Resp:  16  Temp:  36.6 C  SpO2: 96% 90%    Last Pain:  Vitals:   12/26/22 0811  TempSrc: Oral  PainSc:                  [name]

## 2022-12-28 ENCOUNTER — Other Ambulatory Visit: Payer: Self-pay

## 2023-05-13 ENCOUNTER — Institutional Professional Consult (permissible substitution): Payer: Medicare Other | Admitting: Thoracic Surgery (Cardiothoracic Vascular Surgery)

## 2023-05-13 VITALS — BP 136/86 | HR 78 | Resp 18 | Ht 71.0 in | Wt 168.0 lb

## 2023-05-13 DIAGNOSIS — R918 Other nonspecific abnormal finding of lung field: Secondary | ICD-10-CM | POA: Diagnosis not present

## 2023-05-13 NOTE — Progress Notes (Signed)
[facility]P [facility] Referring Provider is Bethanie Dicker, MD  Chief Complaint  Patient presents with   Lung Mass    Surgical consult, LS Chest CT 11/18/22/ PET Scan 02/13/23/ Chest CT 01/29/23    HPI: Mr. Juan Brewer at the consultation regarding a left upper lobe lung mass.  Juan Brewer is a 77 year old man with a past medical history significant for tobacco abuse, emphysema, hypertension, hyperlipidemia, stroke, chronic balance issues, ventricular-peritoneal shunt, and arthritis.   He low-dose CT for lung cancer screening in April 2024.  I do not have the films but the report states that "previously described alveolar pneumonia is undergone significant resolution of clearing.  There is now residual pneumonitis versus residual scar versus loculated fluid or pleural thickening in a similar distribution in the lateral and anterior aspect of the left upper lobe."    He had a follow-up CT in July which showed a nodular density in the anterior medial left upper lobe.  A PET/CT from 02/13/2023 showed a 3.9 x 3 cm anterior medial nodule with some peripheral hypermetabolic activity.  It was felt to be pleural-based.  He has productive cough and wheezing.  Also will get pain with coughing.  He has frequent heartburn which is medication related.  No exertional component.  His mobility is limited by arthritis and balance issues secondary to a stroke about 3 years ago.  He does bleed and bruise easily.  Not short of breath with routine activities.  Still smokes about a pack a day.  He is still working in [employer], [employer].  Zubrod Score: At the time of surgery this patient's most appropriate activity status/level should be described as: []     0    Normal activity, no symptoms []     1    Restricted in physical strenuous activity but ambulatory, able to do out light work [x]     2    Ambulatory and capable of self care, unable to do work activities, up and about >50 % of waking hours                               []     3    Only limited self care, in bed greater than 50% of waking hours []     4    Completely disabled, no self care, confined to bed or chair []     5    Moribund  Past Medical History:  Diagnosis Date   Arthritis    back   Hypertension    Stroke (cerebrum) [facility])     Past Surgical History:  Procedure Laterality Date   CHOLECYSTECTOMY     LUMBAR DISC SURGERY  1971   VENTRICULOPERITONEAL SHUNT Right 12/25/2022   Procedure: RIGHT SHUNT INSERTION VENTRICULAR-PERITONEAL;  Surgeon: Tressie Stalker, MD;  Location: [facility] OR;  Service: Neurosurgery;  Laterality: Right;  RM 21 PATIENT CANT MOVE UP    Family History  Problem Relation Age of Onset   Dementia Father    Parkinson's disease Father    Healthy Brother    Stroke Neg Hx    Neuropathy Neg Hx     Social History Social History   Tobacco Use   Smoking status: Every Day    Current packs/day: 1.00    Types: Cigarettes   Smokeless tobacco: Never  Vaping Use   Vaping status: Never Used  Substance Use Topics   Alcohol use: Never   Drug use: Never  Current Outpatient Medications  Medication Sig Dispense Refill   ALPRAZolam (XANAX) 0.5 MG tablet Take 0.5 mg by mouth 4 (four) times daily.     Ascorbic Acid (VITAMIN C PO) Take 1 tablet by mouth daily.     clopidogrel (PLAVIX) 75 MG tablet Take 75 mg by mouth daily.     docusate sodium (COLACE) 100 MG capsule Take 1 capsule (100 mg total) by mouth 2 (two) times daily. 30 capsule 0   Fluticasone-Umeclidin-Vilant (TRELEGY ELLIPTA) 100-62.5-25 MCG/ACT AEPB Inhale 1 puff into the lungs daily.     ibuprofen (ADVIL) 200 MG tablet Take 200-400 mg by mouth 3 (three) times daily as needed for moderate pain.     losartan (COZAAR) 100 MG tablet Take 100 mg by mouth daily.     Multiple Vitamins-Minerals (ZINC PO) Take 1 tablet by mouth daily.     simvastatin (ZOCOR) 40 MG tablet Take 40 mg by mouth at bedtime.     triazolam (HALCION) 0.25 MG tablet Take 0.25 mg by mouth at bedtime.      nicotine (NICODERM CQ - DOSED IN MG/24 HOURS) 14 mg/24hr patch Place 1 patch (14 mg total) onto the skin daily. (Patient not taking: Reported on 05/13/2023) 28 patch 0   No current facility-administered medications for this visit.    No Known Allergies  Review of Systems  Constitutional:  Negative for activity change and unexpected weight change.  HENT:  Negative for trouble swallowing and voice change.   Eyes:  Negative for visual disturbance.  Respiratory:  Positive for cough and wheezing. Negative for shortness of breath.   Cardiovascular:  Negative for chest pain, palpitations and leg swelling.  Gastrointestinal:  Positive for abdominal pain (Heartburn).  Musculoskeletal:  Positive for arthralgias, gait problem and joint swelling.  Neurological:  Negative for seizures and speech difficulty.       Balance issues/falls  Hematological:  Negative for adenopathy. Bruises/bleeds easily.  Psychiatric/Behavioral:  Positive for dysphoric mood.   All other systems reviewed and are negative.   BP 136/86   Pulse 78   Resp 18   Ht 5\' 11"  (1.803 m)   Wt 168 lb (76.2 kg)   SpO2 91% Comment: RA  BMI 23.43 kg/m  Physical Exam Vitals reviewed.  Constitutional:      General: He is not in acute distress.    Appearance: Normal appearance.  HENT:     Head: Normocephalic and atraumatic.  Eyes:     General: No scleral icterus.    Extraocular Movements: Extraocular movements intact.  Neck:     Vascular: No carotid bruit.  Cardiovascular:     Rate and Rhythm: Normal rate and regular rhythm.     Heart sounds: Normal heart sounds. No murmur heard.    No friction rub. No gallop.  Pulmonary:     Effort: Pulmonary effort is normal. No respiratory distress.     Comments: Diminished breath sounds right base, clear on left Abdominal:     General: There is no distension.     Palpations: Abdomen is soft.  Musculoskeletal:     Cervical back: Neck supple.  Lymphadenopathy:     Cervical: No  cervical adenopathy.  Skin:    Comments: Extensive ecchymoses on both arms  Neurological:     General: No focal deficit present.     Mental Status: He is alert and oriented to person, place, and time.     Cranial Nerves: No cranial nerve deficit.     Diagnostic Tests: I  personally reviewed the PET/CT images.  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  There is a irregular, rounded nodular soft tissue density in anterior medial left upper lobe measures 3.9 x 3 cm.  Peripheral hypermetabolism.  No mediastinal or hilar adenopathy.  Pulmonary function testing 10/11/2020 FVC 4.43 (108%) FEV1 2.26 (70%) FEV1 2.44 (76%) postbronchodilator DLCO 13.1 (64%)  Impression: Juan Brewer is a 77 year old man with a past medical history significant for tobacco abuse, emphysema, hypertension, hyperlipidemia, stroke, chronic balance issues, ventricular-peritoneal shunt, and arthritis.  Found to have a soft tissue density in the anterior medial left upper lobe adjacent to the pleura.    Left upper lobe pleural-based mass- Unusual appearing lesion but does have some peripheral metabolic activity on PET/CT.  Cannot rule out malignancy but also could be some type of postinflammatory loculated fluid or organizing pneumonia.  Based on the CT and PET I think the easiest way to get a diagnosis would be to do a CT-guided needle biopsy, if IR is willing to do so.  His scans are about 3 months old at this point, so I think we need to rescan him before we make a final determination as to how to biopsy.  He is not a good surgical candidate given the severity of his emphysema even though his PFTs a couple of years ago were surprisingly good compared to the CT images.  He has multiple other issues including very limited mobility that increases risk for significant complications with surgery.  I would not completely rule out at this point but would not pursue that aggressively with  diagnosis.  Plan: Will do repeat CT with super D protocol Return in 1 to 2 weeks to discuss results and plan biopsy.  I spent over 30 minutes in review of records, images, and in consultation with Mr. Juan Brewer today.  Loreli Slot, MD [facility] [phone]

## 2023-05-14 ENCOUNTER — Other Ambulatory Visit: Payer: Self-pay | Admitting: Thoracic Surgery (Cardiothoracic Vascular Surgery)

## 2023-05-14 ENCOUNTER — Other Ambulatory Visit: Payer: Self-pay

## 2023-05-14 DIAGNOSIS — R918 Other nonspecific abnormal finding of lung field: Secondary | ICD-10-CM

## 2023-05-18 NOTE — Progress Notes (Signed)
The proposed treatment discussed in conference is for discussion purpose only and is not a binding recommendation.  The patients have not been physically examined, or presented with their treatment options.  Therefore, final treatment plans cannot be decided.  

## 2023-05-19 ENCOUNTER — Ambulatory Visit (HOSPITAL_BASED_OUTPATIENT_CLINIC_OR_DEPARTMENT_OTHER)
Admission: RE | Admit: 2023-05-19 | Discharge: 2023-05-19 | Disposition: A | Discharge status: Home / Self Care | Payer: Medicare Other | Source: Ambulatory Visit | Attending: Thoracic Surgery (Cardiothoracic Vascular Surgery) | Admitting: Thoracic Surgery (Cardiothoracic Vascular Surgery)

## 2023-05-19 DIAGNOSIS — R918 Other nonspecific abnormal finding of lung field: Secondary | ICD-10-CM | POA: Insufficient documentation

## 2023-05-21 IMAGING — CT CT HEAD W/O CM
3 series · 16 of 47 positions shown, 19 images · non-contrast
Comparison: None.

CLINICAL DATA: Neuro deficit, acute, stroke suspected

EXAM:
CT HEAD WITHOUT CONTRAST
TECHNIQUE: Contiguous axial images were obtained from the base of the skull
through the vertex without intravenous contrast.

[Series 2: head wo · axial · 0.45mm/px · z∈[-444,-308]mm · 10 of 33 slices shown, 13 images]
[im 3/33  brain]
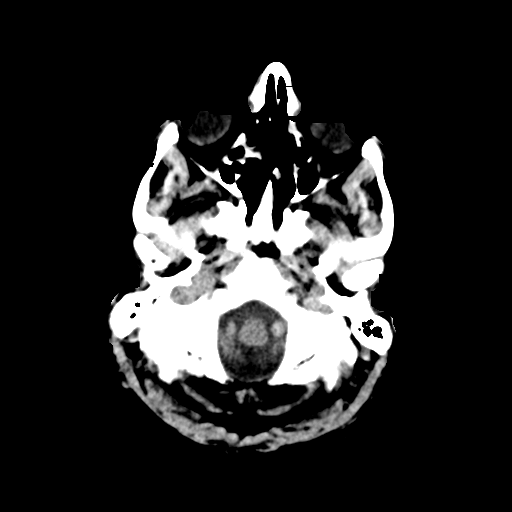
[im 3/33  bone]
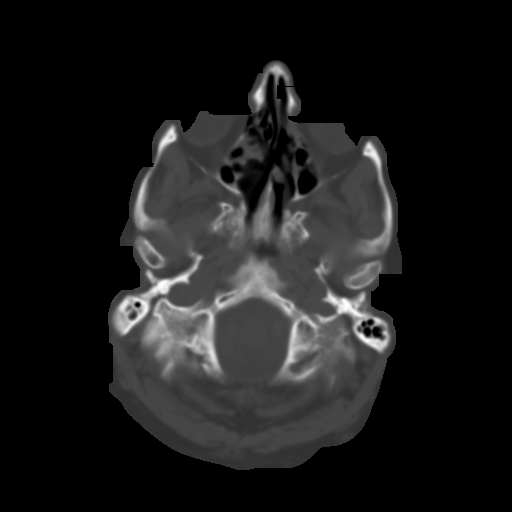
[im 6/33  brain]
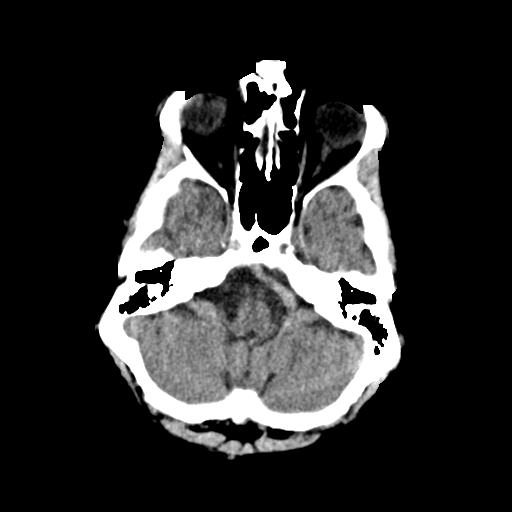
[im 9/33  brain]
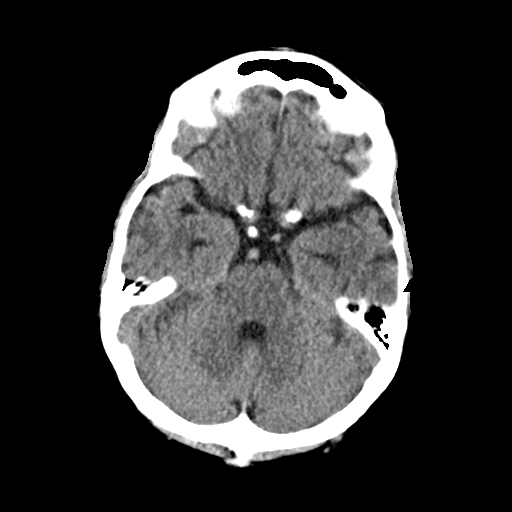
[im 12/33  brain]
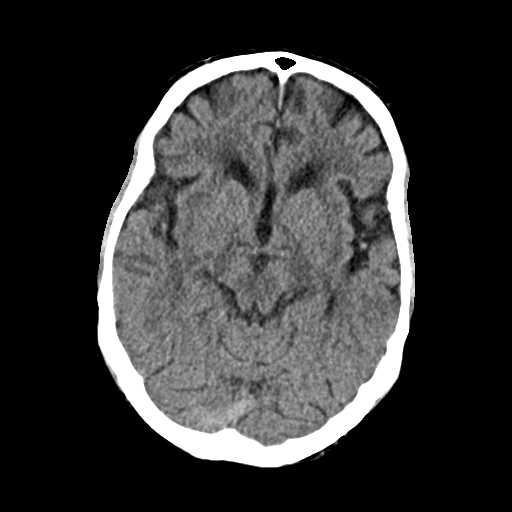
[im 15/33  brain]
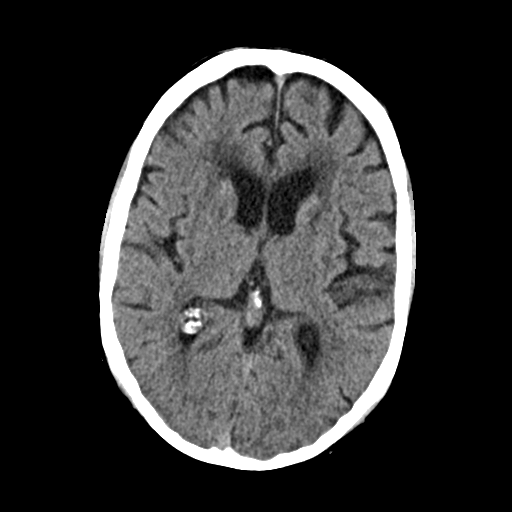
[im 15/33  bone]
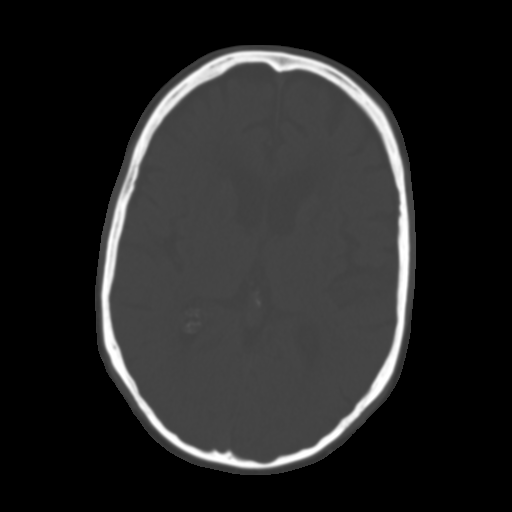
[im 18/33  brain]
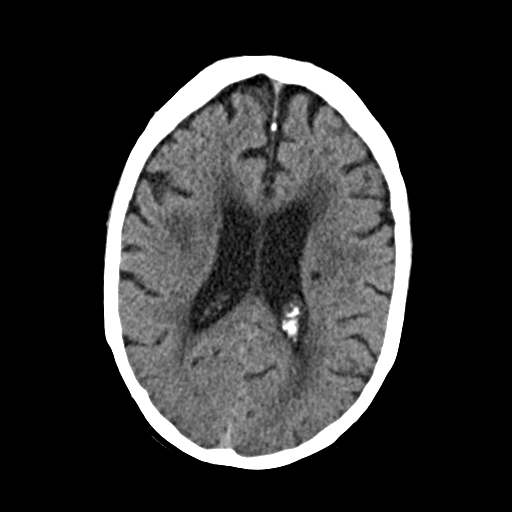
[im 21/33  brain]
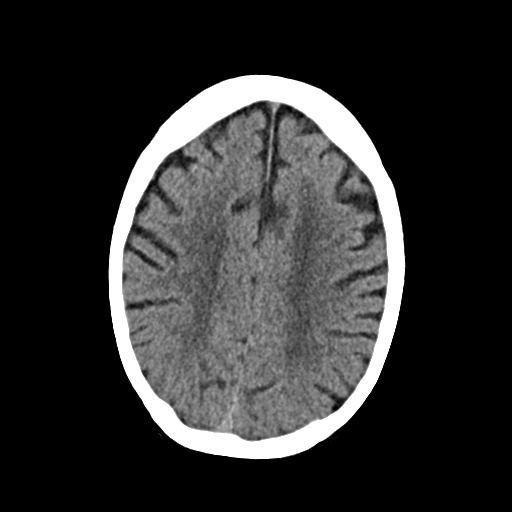
[im 25/33  brain]
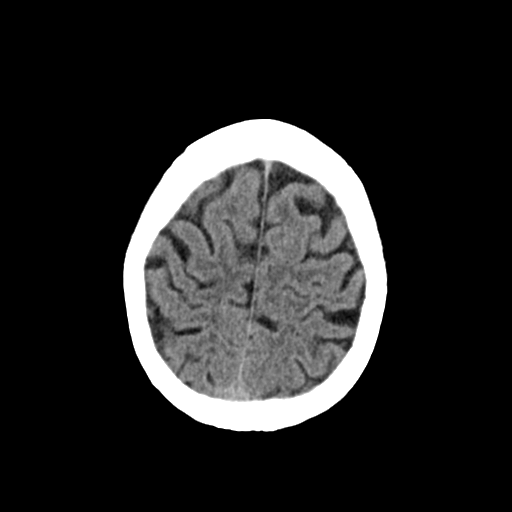
[im 27/33  brain]
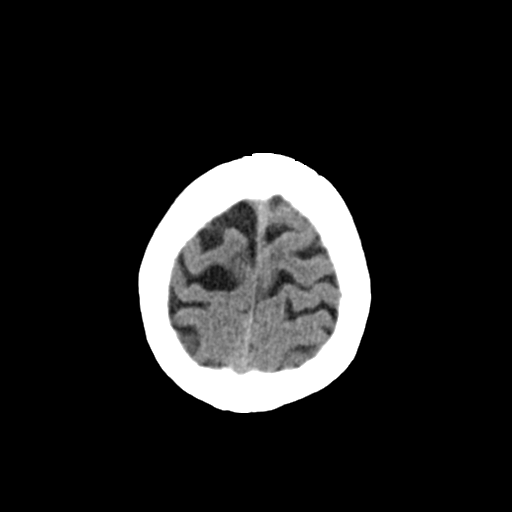
[im 27/33  bone]
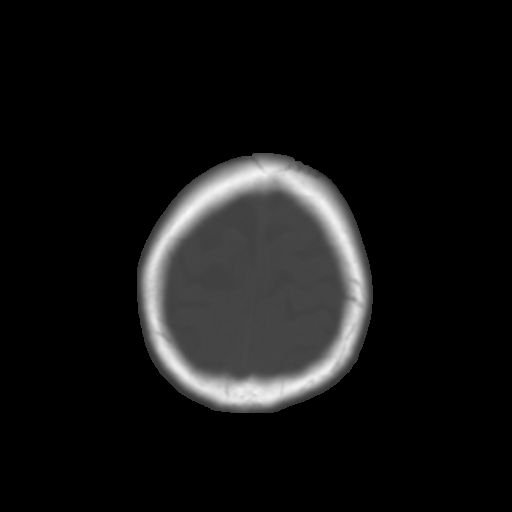
[im 30/33  brain]
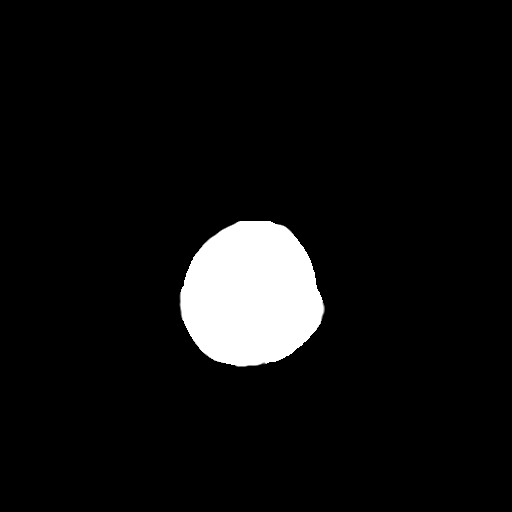

[Series 4: coronal soft · coronal · 0.35mm/px · 3 of 73 slices shown]
[im 25/73  brain]
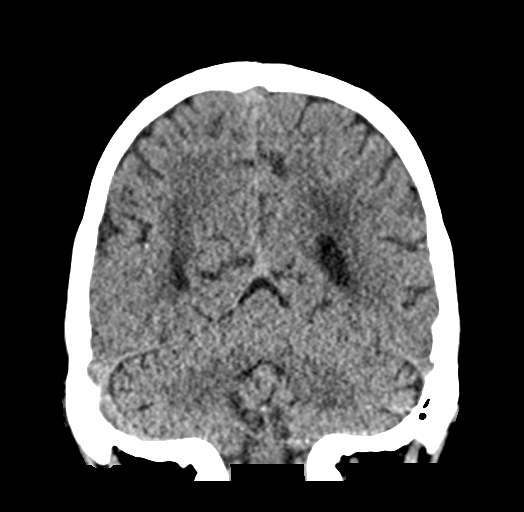
[im 33/73  brain]
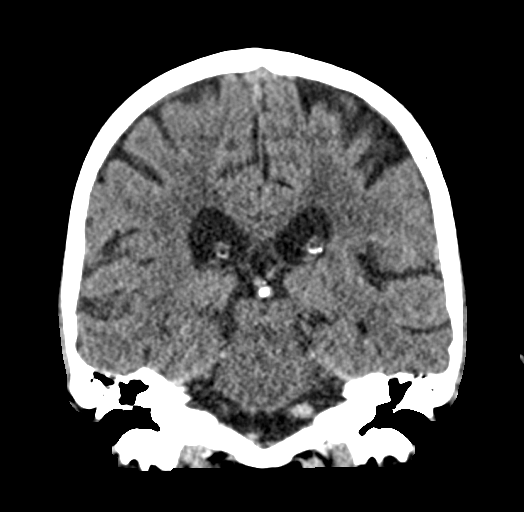
[im 41/73  brain]
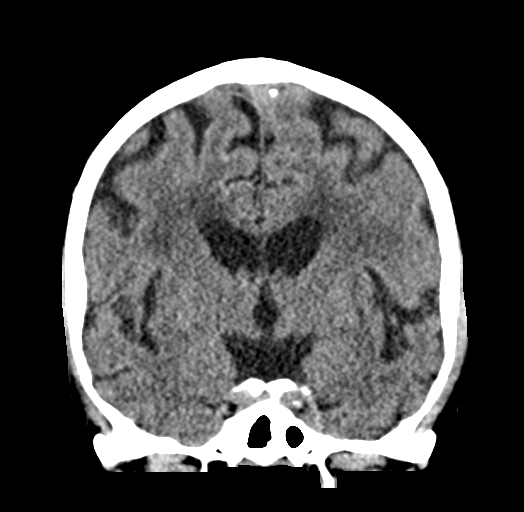

[Series 5: sag soft · sagittal · 0.35mm/px · 3 of 56 slices shown]
[im 19/56  brain]
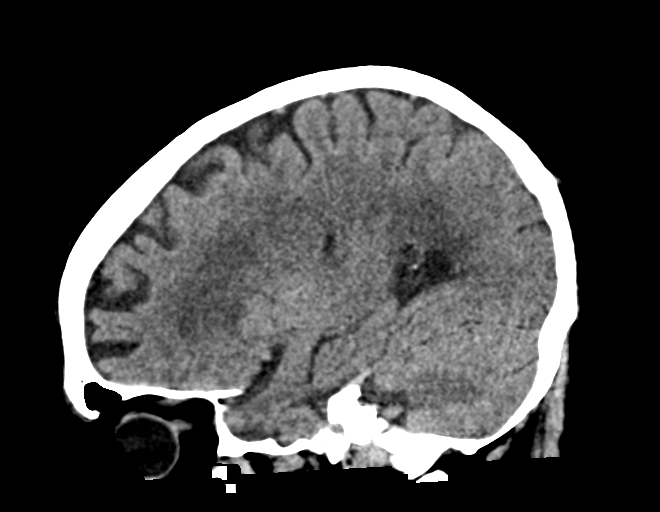
[im 28/56  brain]
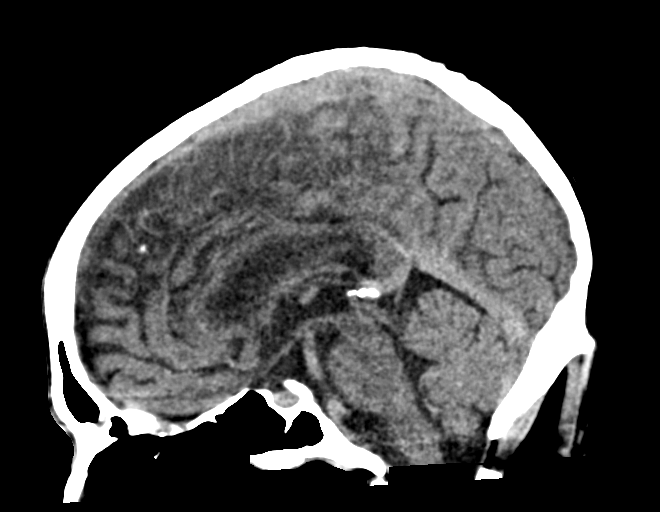
[im 37/56  brain]
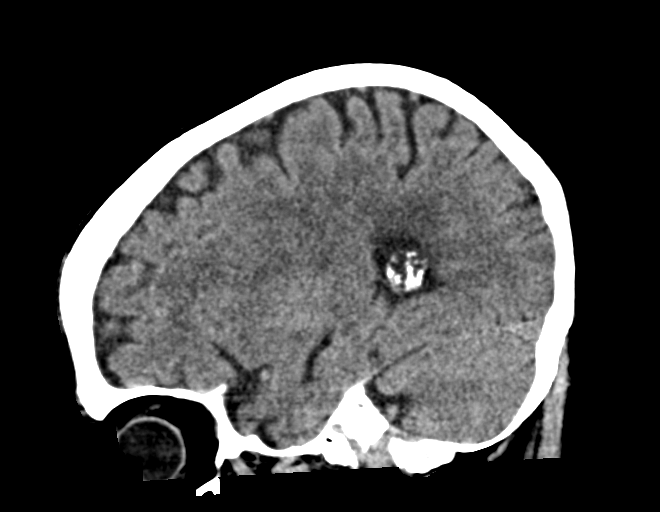

[16 of 47 positions shown; findings below may reference images not displayed]

FINDINGS: Brain: There is atrophy and chronic small vessel disease changes. No
acute intracranial abnormality. Specifically, no hemorrhage,
hydrocephalus, mass lesion, acute infarction, or significant
intracranial injury.

Vascular: No hyperdense vessel or unexpected calcification.

Skull: No acute calvarial abnormality.

Sinuses/Orbits: No acute findings

Other: None
IMPRESSION: Atrophy, chronic microvascular disease.

No acute intracranial abnormality.

## 2023-06-03 ENCOUNTER — Ambulatory Visit: Payer: Medicare Other | Admitting: Thoracic Surgery (Cardiothoracic Vascular Surgery)

## 2023-06-09 ENCOUNTER — Other Ambulatory Visit: Payer: Self-pay | Admitting: Thoracic Surgery (Cardiothoracic Vascular Surgery)

## 2023-06-09 ENCOUNTER — Ambulatory Visit (INDEPENDENT_AMBULATORY_CARE_PROVIDER_SITE_OTHER): Payer: Medicare Other | Admitting: Thoracic Surgery (Cardiothoracic Vascular Surgery)

## 2023-06-09 ENCOUNTER — Encounter: Payer: Self-pay | Admitting: Thoracic Surgery (Cardiothoracic Vascular Surgery)

## 2023-06-09 VITALS — BP 123/79 | HR 85 | Resp 18 | Ht 71.0 in

## 2023-06-09 DIAGNOSIS — R918 Other nonspecific abnormal finding of lung field: Secondary | ICD-10-CM

## 2023-06-09 NOTE — Progress Notes (Signed)
[address]       Juan Brewer [id number]             [phone]     HPI: Mr. Juan Brewer returns for follow-up of the left pleural-based mass versus fluid collection.  Juan Brewer is a 77 year old man with a history of tobacco abuse, emphysema, hypertension, hyperlipidemia, stroke, chronic balance issues, VP shunt, and arthritis.  He had a low-dose CT for lung cancer screening in April which showed "previously described alveolar pneumonia has undergone significant resolution."  There was "residual pneumonitis versus scar versus loculated fluid in a similar distribution in the lateral and anterior aspect of the left upper lobe."  A follow-up CT in July showed a nodular density in the anterior medial left upper lobe versus a pleural-based mass.  PET/CT showed peripheral hypermetabolic activity.  I saw him in October.  It has been 3 months since his most recent scan so I sent him for repeat CT and he now returns to discuss the results.  Continues to have productive coughing and wheezing.  Limited mobility.  Still smokes a pack a day.  Past Medical History:  Diagnosis Date   Arthritis    back   Hypertension    Stroke (cerebrum) (HCC)     Current Outpatient Medications  Medication Sig Dispense Refill   ALPRAZolam (XANAX) 0.5 MG tablet Take 0.5 mg by mouth 4 (four) times daily.     Ascorbic Acid (VITAMIN C PO) Take 1 tablet by mouth daily.     clopidogrel (PLAVIX) 75 MG tablet Take 75 mg by mouth daily.     docusate sodium (COLACE) 100 MG capsule Take 1 capsule (100 mg total) by mouth 2 (two) times daily. 30 capsule 0   Fluticasone-Umeclidin-Vilant (TRELEGY ELLIPTA) 100-62.5-25 MCG/ACT AEPB Inhale 1 puff into the lungs daily.     ibuprofen (ADVIL) 200 MG tablet Take 200-400 mg by mouth 3 (three) times daily as needed for moderate pain.     losartan (COZAAR) 100 MG tablet Take 100 mg by mouth daily.     Multiple Vitamins-Minerals (ZINC PO) Take 1 tablet by mouth daily.      nicotine (NICODERM CQ - DOSED IN MG/24 HOURS) 14 mg/24hr patch Place 1 patch (14 mg total) onto the skin daily. 28 patch 0   simvastatin (ZOCOR) 40 MG tablet Take 40 mg by mouth at bedtime.     triazolam (HALCION) 0.25 MG tablet Take 0.25 mg by mouth at bedtime.     No current facility-administered medications for this visit.    Physical Exam BP 123/79   Pulse 85   Resp 18   Ht 5\' 11"  (1.803 m)   SpO2 96% Comment: RA  BMI 23.82 kg/m  77 year old man in no acute distress Alert and oriented x 3 with no focal deficits Lungs diminished breath sounds bilaterally  Diagnostic Tests: CT CHEST WITHOUT CONTRAST   TECHNIQUE: Multidetector CT imaging of the chest was performed using thin slice collimation for electromagnetic bronchoscopy planning purposes, without intravenous contrast.   RADIATION DOSE REDUCTION: This exam was performed according to the departmental dose-optimization program which includes automated exposure control, adjustment of the mA and/or kV according to patient size and/or use of iterative reconstruction technique.   COMPARISON:  No available prior exams.   FINDINGS: Cardiovascular: Atherosclerotic calcification of the aorta and coronary arteries. Heart size normal. No pericardial effusion.   Mediastinum/Nodes: No pathologically enlarged mediastinal or axillary lymph nodes. Hilar regions are difficult to  definitively evaluate without IV contrast. Air in the esophagus can be seen with dysmotility.   Lungs/Pleura: Bullous paraseptal and centrilobular emphysema. Somewhat nodular appearing complex pleural thickening in the ventral and lateral upper/mid left hemithorax. There are a few associated pleural calcifications. However, no calcified pleural plaques in the right hemithorax. No pleural fluid. Airway is unremarkable.   Upper Abdomen: Spleen may be enlarged but is incompletely imaged. Ventriculoperitoneal shunt terminates in the right  abdomen. Visualized portions of the liver, adrenal glands, kidneys, spleen, pancreas, stomach and bowel are otherwise grossly unremarkable. No upper abdominal adenopathy. Cholecystectomy.   Musculoskeletal: Degenerative changes in the spine. Likely old T10 compression fracture.   IMPRESSION: 1. Irregular complex pleural thickening in the left hemithorax, worrisome for malignancy. No available prior exams for comparison. 2. Spleen may be enlarged but is incompletely imaged. 3. Aortic atherosclerosis (ICD10-I70.0). Coronary artery calcification. 4.  Emphysema (ICD10-J43.9).     Electronically Signed   By: Leanna Battles M.D.   On: 06/05/2023 11:50 I personally reviewed the CT images.  No change in the complex pleural thickening in the left hemithorax.  Impression: Juan Brewer is a 77 year old man with a history of tobacco abuse, emphysema, hypertension, hyperlipidemia, stroke, chronic balance issues, VP shunt, and arthritis.   Pleural thickening/pleural-based mass versus lung mass-May be chronic fluid collection or consolidation due to his previous pneumonia.  There is been no significant change over the past 3 months when compared to his films from the [facility].  He did have a PET there which showed peripheral enhancement, so there is still concern for malignancy.  Not an operative candidate, but potentially be a candidate for chemo and/or radiation.  I think the best option would be to do a CT-guided needle biopsy.  We discussed this with radiology at our conference and they thought it was reasonable if the collection was still present.  Will refer for that.  He is on Plavix for previous stroke.  That can be held for 5 days prior to the biopsy if desired.  If radiology is unsuccessful then we could consider a navigational bronchoscopy, but I think he has a high risk of pneumothorax with that.  Plan: CT-guided needle biopsy Return in 2-3 weeks to discuss  results  Loreli Slot, MD [facility] [phone]

## 2023-06-15 ENCOUNTER — Other Ambulatory Visit: Payer: Self-pay | Admitting: Thoracic Surgery (Cardiothoracic Vascular Surgery)

## 2023-06-15 DIAGNOSIS — R918 Other nonspecific abnormal finding of lung field: Secondary | ICD-10-CM

## 2023-06-15 NOTE — Progress Notes (Signed)
Juan Overlie, MD  [name] [facility] for CT guided biopsy / aspiration of left anterior chest pleural lesion.  PET CT at [facility].  [place]       Previous Messages    ----- Message ----- From: [name] Sent: 06/11/2023  10:25 AM EST To: Caroleen Hamman, NT; [name]; * Subject: CT GUIDED NEEDLE PLACEMENT                    Procedure : CT GUIDED NEEDLE PLACEMENT  Reason: lung mass Dx: Lung mass [R91.8 (ICD-10-CM)]    History : Ct Super D Chest , CT Head -- PET and CT chest (PACs Patient ID: [id number] )  Provider : Loreli Slot, MD  Provider contact : [phone]

## 2023-06-26 ENCOUNTER — Other Ambulatory Visit: Payer: Self-pay | Admitting: Student

## 2023-06-26 DIAGNOSIS — R918 Other nonspecific abnormal finding of lung field: Secondary | ICD-10-CM

## 2023-06-26 NOTE — H&P (Signed)
Chief Complaint: Lung mass  Referring Provider(s): Dorris Fetch  Supervising Physician: Ruel Favors  Patient Status: [facility] - Out-pt  History of Present Illness: Juan Brewer is a 77 y.o. male with a history of tobacco abuse, emphysema, hypertension, hyperlipidemia, stroke, chronic balance issues, VP shunt, and arthritis.   He had a low-dose CT for lung cancer screening in April which showed= Previously described alveolar pneumonia has undergone significant resolution. Residual pneumonitis versus scar versus loculated fluid in a similar distribution in the lateral and anterior aspect of the left upper lobe.   A follow-up CT in July showed a nodular density in the anterior medial left upper lobe versus a pleural-based mass.    PET/CT showed peripheral hypermetabolic activity.   Repeat scan done 06/05/23 showed irregular complex pleural thickening in the left hemithorax, worrisome for malignancy.  He is here today for biopsy for tissue diagnosis.   He continues to have productive coughing and wheezing.    He continues to smoke a pack a day.  Patient is Full Code  Past Medical History:  Diagnosis Date   Arthritis    back   Hypertension    Stroke (cerebrum) [facility])     Past Surgical History:  Procedure Laterality Date   CHOLECYSTECTOMY     LUMBAR DISC SURGERY  1971   VENTRICULOPERITONEAL SHUNT Right 12/25/2022   Procedure: RIGHT SHUNT INSERTION VENTRICULAR-PERITONEAL;  Surgeon: Tressie Stalker, MD;  Location: [facility] OR;  Service: Neurosurgery;  Laterality: Right;  RM 21 PATIENT CANT MOVE UP    Allergies: Patient has no known allergies.  Medications: Prior to Admission medications   Medication Sig Start Date End Date Taking? Authorizing Provider  ALPRAZolam Prudy Feeler) 0.5 MG tablet Take 0.5 mg by mouth 4 (four) times daily. 10/29/20   [provider]  Ascorbic Acid (VITAMIN C PO) Take 1 tablet by mouth daily.    [provider]  clopidogrel  (PLAVIX) 75 MG tablet Take 75 mg by mouth daily. 07/07/22   [provider]  docusate sodium (COLACE) 100 MG capsule Take 1 capsule (100 mg total) by mouth 2 (two) times daily. 12/26/22   Tressie Stalker, MD  Fluticasone-Umeclidin-Vilant (TRELEGY ELLIPTA) 100-62.5-25 MCG/ACT AEPB Inhale 1 puff into the lungs daily.    [provider]  ibuprofen (ADVIL) 200 MG tablet Take 200-400 mg by mouth 3 (three) times daily as needed for moderate pain.    [provider]  losartan (COZAAR) 100 MG tablet Take 100 mg by mouth daily. 10/07/22   [provider]  Multiple Vitamins-Minerals (ZINC PO) Take 1 tablet by mouth daily.    [provider]  nicotine (NICODERM CQ - DOSED IN MG/24 HOURS) 14 mg/24hr patch Place 1 patch (14 mg total) onto the skin daily. 01/11/21   Love, Evlyn Kanner, PA-C  simvastatin (ZOCOR) 40 MG tablet Take 40 mg by mouth at bedtime. 10/26/20   [provider]  triazolam (HALCION) 0.25 MG tablet Take 0.25 mg by mouth at bedtime. 06/26/20   [provider]     Family History  Problem Relation Age of Onset   Dementia Father    Parkinson's disease Father    Healthy Brother    Stroke Neg Hx    Neuropathy Neg Hx     Social History   Socioeconomic History   Marital status: Married    Spouse name: Not on file   Number of children: 3   Years of education: Not on file   Highest education  level: Not on file  Occupational History   Not on file  Tobacco Use   Smoking status: Every Day    Current packs/day: 1.00    Types: Cigarettes   Smokeless tobacco: Never  Vaping Use   Vaping status: Never Used  Substance and Sexual Activity   Alcohol use: Never   Drug use: Never   Sexual activity: Not on file  Other Topics Concern   Not on file  Social History Narrative   Not on file   Social Determinants of Health   Financial Resource Strain: Not on file  Food Insecurity: Low Risk  (06/26/2022)   [facility]   Hunger Vital Sign    Worried About Running Out of Food in the Last Year: Never true    Within the past 12 months, the food you bought just didn't last and you didn't have money to get more: Not on file  Transportation Needs: Not on file  Physical Activity: Not on file  Stress: Not on file  Social Connections: Not on file     Review of Systems: A 12 point ROS discussed and pertinent positives are indicated in the HPI above.  All other systems are negative.    Vital Signs: There were no vitals taken for this visit.  Advance Care Plan: The advanced care place/surrogate decision maker was discussed at the time of visit and the patient did not wish to discuss or was not able to name a surrogate decision maker or provide an advance care plan.  Physical Exam Vitals reviewed.  Constitutional:      Appearance: Normal appearance.  HENT:     Head: Normocephalic and atraumatic.  Eyes:     Extraocular Movements: Extraocular movements intact.  Cardiovascular:     Rate and Rhythm: Normal rate and regular rhythm.  Pulmonary:     Effort: Pulmonary effort is normal. No respiratory distress.     Breath sounds: Normal breath sounds.  Abdominal:     General: There is no distension.     Palpations: Abdomen is soft.     Tenderness: There is no abdominal tenderness.  Musculoskeletal:        General: Normal range of motion.     Cervical back: Normal range of motion.  Skin:    General: Skin is warm and dry.  Neurological:     General: No focal deficit present.     Mental Status: He is alert and oriented to person, place, and time.  Psychiatric:        Mood and Affect: Mood normal.        Behavior: Behavior normal.        Thought Content: Thought content normal.        Judgment: Judgment normal.     Imaging: No results found.  Labs:  CBC: Recent Labs    12/17/22 1331  WBC 13.6*  HGB 14.6  HCT 42.1  PLT 102*    COAGS: No results for input(s): "INR", "APTT" in  the last 8760 hours.  BMP: Recent Labs    12/17/22 1331  NA 139  K 3.8  CL 105  CO2 24  GLUCOSE 168*  BUN 23  CALCIUM 10.6*  CREATININE 1.28*  GFRNONAA 59*    LIVER FUNCTION TESTS: No results for input(s): "BILITOT", "AST", "ALT", "ALKPHOS", "PROT", "ALBUMIN" in the last 8760 hours.  TUMOR MARKERS: No results for input(s): "AFPTM", "CEA", "CA199", "CHROMGRNA" in the last 8760 hours.  Assessment and Plan:  Irregular complex pleural thickening in the left hemithorax, worrisome for malignancy.  Will proceed with image guided biopsy today by Dr. Miles Costain.  Risks and benefits of CT guided lung nodule biopsy was discussed with the patient including, but not limited to bleeding, hemoptysis, respiratory failure requiring intubation, infection, pneumothorax requiring chest tube placement, stroke from air embolism or even death.  All of the patient's questions were answered and the patient is agreeable to proceed.  Consent signed and in chart.  Thank you for allowing our service to participate in Mattthew Kawecki 's care.  Electronically Signed: Gwynneth Macleod, PA-C   06/26/2023, 2:36 PM      I spent a total of  30 Minutes  in face to face in clinical consultation, greater than 50% of which was counseling/coordinating care for lung/pleural biopsy.

## 2023-06-29 ENCOUNTER — Other Ambulatory Visit: Payer: Self-pay

## 2023-06-29 ENCOUNTER — Ambulatory Visit (HOSPITAL_COMMUNITY)
Admission: RE | Admit: 2023-06-29 | Discharge: 2023-06-29 | Disposition: A | Discharge status: Home / Self Care | Payer: Medicare Other | Source: Ambulatory Visit | Attending: Thoracic Surgery (Cardiothoracic Vascular Surgery) | Admitting: Thoracic Surgery (Cardiothoracic Vascular Surgery)

## 2023-06-29 ENCOUNTER — Encounter (HOSPITAL_COMMUNITY): Payer: Self-pay

## 2023-06-29 DIAGNOSIS — R918 Other nonspecific abnormal finding of lung field: Secondary | ICD-10-CM | POA: Insufficient documentation

## 2023-06-29 DIAGNOSIS — Z982 Presence of cerebrospinal fluid drainage device: Secondary | ICD-10-CM | POA: Diagnosis not present

## 2023-06-29 DIAGNOSIS — J439 Emphysema, unspecified: Secondary | ICD-10-CM | POA: Insufficient documentation

## 2023-06-29 DIAGNOSIS — I10 Essential (primary) hypertension: Secondary | ICD-10-CM | POA: Insufficient documentation

## 2023-06-29 DIAGNOSIS — F17210 Nicotine dependence, cigarettes, uncomplicated: Secondary | ICD-10-CM | POA: Insufficient documentation

## 2023-06-29 DIAGNOSIS — R059 Cough, unspecified: Secondary | ICD-10-CM | POA: Insufficient documentation

## 2023-06-29 DIAGNOSIS — R062 Wheezing: Secondary | ICD-10-CM | POA: Insufficient documentation

## 2023-06-29 DIAGNOSIS — E785 Hyperlipidemia, unspecified: Secondary | ICD-10-CM | POA: Diagnosis not present

## 2023-06-29 DIAGNOSIS — Z8673 Personal history of transient ischemic attack (TIA), and cerebral infarction without residual deficits: Secondary | ICD-10-CM | POA: Diagnosis not present

## 2023-06-29 DIAGNOSIS — M1990 Unspecified osteoarthritis, unspecified site: Secondary | ICD-10-CM | POA: Insufficient documentation

## 2023-06-29 LAB — PROTIME-INR
INR: 1 (ref 0.8–1.2)
Prothrombin Time: 13.6 s (ref 11.4–15.2)

## 2023-06-29 LAB — CBC
HCT: 40.9 % (ref 39.0–52.0)
Hemoglobin: 13.7 g/dL (ref 13.0–17.0)
MCH: 30.5 pg (ref 26.0–34.0)
MCHC: 33.5 g/dL (ref 30.0–36.0)
MCV: 91.1 fL (ref 80.0–100.0)
Platelets: 83 10*3/uL — ABNORMAL LOW (ref 150–400)
RBC: 4.49 MIL/uL (ref 4.22–5.81)
RDW: 13.1 % (ref 11.5–15.5)
WBC: 5.7 10*3/uL (ref 4.0–10.5)
nRBC: 0 % (ref 0.0–0.2)

## 2023-06-29 MED ORDER — FENTANYL CITRATE (PF) 100 MCG/2ML IJ SOLN
INTRAMUSCULAR | Status: AC | PRN
Start: 2023-06-29 — End: 2023-06-29
  Administered 2023-06-29: 50 ug via INTRAVENOUS
  Administered 2023-06-29: 25 ug via INTRAVENOUS

## 2023-06-29 MED ORDER — FENTANYL CITRATE (PF) 100 MCG/2ML IJ SOLN
INTRAMUSCULAR | Status: AC
  Filled 2023-06-29: qty 4

## 2023-06-29 MED ORDER — LIDOCAINE HCL 1 % IJ SOLN
10.0000 mL | Freq: Once | INTRAMUSCULAR | Status: DC
Start: 2023-06-29 — End: 2023-06-30

## 2023-06-29 MED ORDER — MIDAZOLAM HCL 2 MG/2ML IJ SOLN
INTRAMUSCULAR | Status: AC | PRN
  Administered 2023-06-29: .5 mg via INTRAVENOUS
  Administered 2023-06-29: 1 mg via INTRAVENOUS

## 2023-06-29 MED ORDER — MIDAZOLAM HCL 2 MG/2ML IJ SOLN
INTRAMUSCULAR | Status: AC
  Filled 2023-06-29: qty 4

## 2023-06-29 NOTE — Procedures (Signed)
Interventional Radiology Procedure Note  Procedure: CT BX LEFT ANTERIOR CHEST WALL/PLEURAL MASS    Complications: None  Estimated Blood Loss:  MIN  Findings: 76 G CORES IN Vonzell Schlatter, MD

## 2023-06-29 NOTE — Progress Notes (Addendum)
On arrival to [facility] Rn Mueller, Melynn  stated that the pt had a skin tear left elbow while in radiology for lung biopsy, pt has fragile skin, sterile gauze and medipore tape used to cover. Told wife to leave on for 24- 48 hours.  Pt told to restart plavix tomorrow.

## 2023-07-01 LAB — SURGICAL PATHOLOGY

## 2023-07-14 ENCOUNTER — Ambulatory Visit (INDEPENDENT_AMBULATORY_CARE_PROVIDER_SITE_OTHER): Payer: Medicare Other | Admitting: Thoracic Surgery (Cardiothoracic Vascular Surgery)

## 2023-07-14 ENCOUNTER — Other Ambulatory Visit: Payer: Self-pay | Admitting: Thoracic Surgery (Cardiothoracic Vascular Surgery)

## 2023-07-14 VITALS — BP 120/80 | HR 75 | Resp 20 | Ht 71.0 in | Wt 168.0 lb

## 2023-07-14 DIAGNOSIS — Z9889 Other specified postprocedural states: Secondary | ICD-10-CM | POA: Diagnosis not present

## 2023-07-14 DIAGNOSIS — R918 Other nonspecific abnormal finding of lung field: Secondary | ICD-10-CM

## 2023-07-14 NOTE — Progress Notes (Signed)
      [address]       [place] [address]             [phone]      Patient ID: Juan Brewer, male   DOB: [birth year], 77 y.o.   MRN: [id number]  Juan Brewer returns to discuss the results of his CT-guided needle biopsy.  [name] is a 77 year old man with a history of tobacco abuse, emphysema, hypertension, hyperlipidemia, stroke, chronic balance issues, VP shunt, and arthritis.    He had a low-dose CT for lung cancer screening in April which showed "previously described alveolar pneumonia has undergone significant resolution."  There was "residual pneumonitis versus scar versus loculated fluid in a similar distribution in the lateral and anterior aspect of the left upper lobe."  He had a follow-up CT in July which showed nodular density in the anterior medial left upper lobe versus pleural-based.  He had a PET/CT done at [facility] which showed peripheral hypermetabolism.  Given the unusual appearance, my suspicion was that this was residual loculated pleural effusion/empyema from his previous pneumonia.  He underwent a CT-guided biopsy on 06/29/2023.  They were clearly able to access the largest portion of the "mass."  The pathology showed necrotic debris with fibrosis and inflammation.  I discussed the CT biopsy results with Mr. and [name].  I emphasized that this does not rule out the possibility of cancer.  However they goes along well with the clinical picture being consistent with an inflammatory parapneumonic effusion.  I do not see any need for any additional testing currently.  He is a poor operative candidate so I would not do anything aggressive.  My recommendation was that we repeat a CT scan in a couple of months and just see whether or not there is any signs of progression or any areas that we need to be more concerned about.  They are in agreement with that plan.  Salvatore Decent Dorris Fetch, MD [facility] [phone]

## 2023-09-01 ENCOUNTER — Other Ambulatory Visit: Payer: Medicare Other

## 2023-09-08 ENCOUNTER — Ambulatory Visit: Payer: Medicare Other | Admitting: Thoracic Surgery (Cardiothoracic Vascular Surgery)

## 2023-10-06 ENCOUNTER — Encounter (HOSPITAL_COMMUNITY): Payer: Self-pay

## 2023-10-06 ENCOUNTER — Encounter (HOSPITAL_COMMUNITY): Payer: Self-pay | Admitting: Family Medicine

## 2023-10-06 ENCOUNTER — Inpatient Hospital Stay (HOSPITAL_COMMUNITY)
Admission: EM | Admit: 2023-10-06 | Discharge: 2023-10-13 | DRG: 057 | Source: Other Acute Inpatient Hospital | Attending: Internal Medicine | Admitting: Internal Medicine

## 2023-10-06 DIAGNOSIS — G912 (Idiopathic) normal pressure hydrocephalus: Secondary | ICD-10-CM | POA: Diagnosis not present

## 2023-10-06 DIAGNOSIS — N390 Urinary tract infection, site not specified: Secondary | ICD-10-CM | POA: Diagnosis present

## 2023-10-06 DIAGNOSIS — J439 Emphysema, unspecified: Secondary | ICD-10-CM | POA: Diagnosis present

## 2023-10-06 DIAGNOSIS — Z79899 Other long term (current) drug therapy: Secondary | ICD-10-CM | POA: Diagnosis not present

## 2023-10-06 DIAGNOSIS — I6300 Cerebral infarction due to thrombosis of unspecified precerebral artery: Principal | ICD-10-CM

## 2023-10-06 DIAGNOSIS — I69351 Hemiplegia and hemiparesis following cerebral infarction affecting right dominant side: Secondary | ICD-10-CM

## 2023-10-06 DIAGNOSIS — M11211 Other chondrocalcinosis, right shoulder: Secondary | ICD-10-CM | POA: Diagnosis not present

## 2023-10-06 DIAGNOSIS — M25511 Pain in right shoulder: Secondary | ICD-10-CM | POA: Diagnosis present

## 2023-10-06 DIAGNOSIS — M19011 Primary osteoarthritis, right shoulder: Secondary | ICD-10-CM | POA: Diagnosis not present

## 2023-10-06 DIAGNOSIS — L03113 Cellulitis of right upper limb: Secondary | ICD-10-CM | POA: Diagnosis not present

## 2023-10-06 DIAGNOSIS — F17210 Nicotine dependence, cigarettes, uncomplicated: Secondary | ICD-10-CM | POA: Diagnosis present

## 2023-10-06 DIAGNOSIS — R531 Weakness: Secondary | ICD-10-CM | POA: Diagnosis not present

## 2023-10-06 DIAGNOSIS — F419 Anxiety disorder, unspecified: Secondary | ICD-10-CM | POA: Diagnosis not present

## 2023-10-06 DIAGNOSIS — D696 Thrombocytopenia, unspecified: Secondary | ICD-10-CM | POA: Diagnosis not present

## 2023-10-06 DIAGNOSIS — I809 Phlebitis and thrombophlebitis of unspecified site: Secondary | ICD-10-CM | POA: Diagnosis not present

## 2023-10-06 DIAGNOSIS — G4700 Insomnia, unspecified: Secondary | ICD-10-CM | POA: Diagnosis present

## 2023-10-06 DIAGNOSIS — Z85118 Personal history of other malignant neoplasm of bronchus and lung: Secondary | ICD-10-CM

## 2023-10-06 DIAGNOSIS — I10 Essential (primary) hypertension: Secondary | ICD-10-CM | POA: Diagnosis not present

## 2023-10-06 DIAGNOSIS — Z7409 Other reduced mobility: Secondary | ICD-10-CM | POA: Diagnosis not present

## 2023-10-06 DIAGNOSIS — Z66 Do not resuscitate: Secondary | ICD-10-CM | POA: Diagnosis present

## 2023-10-06 DIAGNOSIS — Z982 Presence of cerebrospinal fluid drainage device: Secondary | ICD-10-CM | POA: Diagnosis not present

## 2023-10-06 DIAGNOSIS — Z7902 Long term (current) use of antithrombotics/antiplatelets: Secondary | ICD-10-CM

## 2023-10-06 DIAGNOSIS — J441 Chronic obstructive pulmonary disease with (acute) exacerbation: Secondary | ICD-10-CM | POA: Diagnosis not present

## 2023-10-06 DIAGNOSIS — J449 Chronic obstructive pulmonary disease, unspecified: Secondary | ICD-10-CM | POA: Diagnosis present

## 2023-10-06 DIAGNOSIS — Z7951 Long term (current) use of inhaled steroids: Secondary | ICD-10-CM

## 2023-10-06 DIAGNOSIS — E785 Hyperlipidemia, unspecified: Secondary | ICD-10-CM | POA: Diagnosis present

## 2023-10-06 DIAGNOSIS — Z820 Family history of epilepsy and other diseases of the nervous system: Secondary | ICD-10-CM | POA: Diagnosis not present

## 2023-10-06 DIAGNOSIS — Z9049 Acquired absence of other specified parts of digestive tract: Secondary | ICD-10-CM | POA: Diagnosis not present

## 2023-10-06 DIAGNOSIS — I4510 Unspecified right bundle-branch block: Secondary | ICD-10-CM | POA: Diagnosis present

## 2023-10-06 DIAGNOSIS — N3000 Acute cystitis without hematuria: Secondary | ICD-10-CM

## 2023-10-06 DIAGNOSIS — L89321 Pressure ulcer of left buttock, stage 1: Secondary | ICD-10-CM | POA: Diagnosis not present

## 2023-10-06 DIAGNOSIS — L8990 Pressure ulcer of unspecified site, unspecified stage: Secondary | ICD-10-CM | POA: Insufficient documentation

## 2023-10-06 DIAGNOSIS — Z8673 Personal history of transient ischemic attack (TIA), and cerebral infarction without residual deficits: Secondary | ICD-10-CM

## 2023-10-06 DIAGNOSIS — R410 Disorientation, unspecified: Secondary | ICD-10-CM | POA: Diagnosis present

## 2023-10-06 MED ORDER — ACETAMINOPHEN 650 MG RE SUPP: 650.0000 mg | RECTAL | Status: DC | PRN

## 2023-10-06 MED ORDER — SENNOSIDES-DOCUSATE SODIUM 8.6-50 MG PO TABS
1.0000 | ORAL_TABLET | Freq: Every evening | ORAL | Status: DC | PRN
  Administered 2023-10-12: 1 via ORAL
  Filled 2023-10-06: qty 1

## 2023-10-06 MED ORDER — PROCHLORPERAZINE EDISYLATE 10 MG/2ML IJ SOLN: 5.0000 mg | Freq: Four times a day (QID) | INTRAMUSCULAR | Status: DC | PRN

## 2023-10-06 MED ORDER — ATENOLOL 25 MG PO TABS
25.0000 mg | ORAL_TABLET | Freq: Every day | ORAL | Status: DC
  Administered 2023-10-07 – 2023-10-12 (×6): 25 mg via ORAL
  Filled 2023-10-06 (×7): qty 1

## 2023-10-06 MED ORDER — ENOXAPARIN SODIUM 40 MG/0.4ML IJ SOSY
40.0000 mg | PREFILLED_SYRINGE | INTRAMUSCULAR | Status: DC
  Administered 2023-10-06 – 2023-10-12 (×6): 40 mg via SUBCUTANEOUS
  Filled 2023-10-06 (×7): qty 0.4

## 2023-10-06 MED ORDER — UMECLIDINIUM BROMIDE 62.5 MCG/ACT IN AEPB
1.0000 | INHALATION_SPRAY | Freq: Every day | RESPIRATORY_TRACT | Status: DC
  Administered 2023-10-07 – 2023-10-13 (×7): 1 via RESPIRATORY_TRACT
  Filled 2023-10-06: qty 7

## 2023-10-06 MED ORDER — ACETAMINOPHEN 160 MG/5ML PO SOLN: 650.0000 mg | ORAL | Status: DC | PRN

## 2023-10-06 MED ORDER — ALPRAZOLAM 0.25 MG PO TABS
0.5000 mg | ORAL_TABLET | Freq: Four times a day (QID) | ORAL | Status: DC | PRN
  Administered 2023-10-08 – 2023-10-11 (×6): 0.5 mg via ORAL
  Filled 2023-10-06 (×6): qty 2

## 2023-10-06 MED ORDER — LORAZEPAM 2 MG/ML IJ SOLN
1.0000 mg | Freq: Once | INTRAMUSCULAR | Status: AC | PRN
  Administered 2023-10-07: 1 mg via INTRAVENOUS
  Filled 2023-10-06: qty 1

## 2023-10-06 MED ORDER — ACETAMINOPHEN 325 MG PO TABS
650.0000 mg | ORAL_TABLET | ORAL | Status: DC | PRN
  Administered 2023-10-09 – 2023-10-11 (×2): 650 mg via ORAL
  Filled 2023-10-06 (×2): qty 2

## 2023-10-06 MED ORDER — TRIAZOLAM 0.125 MG PO TABS
0.2500 mg | ORAL_TABLET | Freq: Every day | ORAL | Status: DC
  Administered 2023-10-06 – 2023-10-12 (×7): 0.25 mg via ORAL
  Filled 2023-10-06 (×7): qty 2

## 2023-10-06 MED ORDER — TRAZODONE HCL 50 MG PO TABS
50.0000 mg | ORAL_TABLET | Freq: Every day | ORAL | Status: DC
  Administered 2023-10-06 – 2023-10-12 (×7): 50 mg via ORAL
  Filled 2023-10-06 (×7): qty 1

## 2023-10-06 MED ORDER — CEPHALEXIN 500 MG PO CAPS
500.0000 mg | ORAL_CAPSULE | Freq: Two times a day (BID) | ORAL | Status: DC
  Administered 2023-10-06 – 2023-10-08 (×4): 500 mg via ORAL
  Filled 2023-10-06 (×4): qty 1

## 2023-10-06 MED ORDER — CLOPIDOGREL BISULFATE 75 MG PO TABS
75.0000 mg | ORAL_TABLET | Freq: Every day | ORAL | Status: DC
  Administered 2023-10-07 – 2023-10-13 (×7): 75 mg via ORAL
  Filled 2023-10-06 (×7): qty 1

## 2023-10-06 MED ORDER — SODIUM CHLORIDE 0.9 % IV SOLN: 1.0000 g | INTRAVENOUS | Status: DC

## 2023-10-06 MED ORDER — NICOTINE 21 MG/24HR TD PT24
21.0000 mg | MEDICATED_PATCH | Freq: Every day | TRANSDERMAL | Status: DC
  Administered 2023-10-06 – 2023-10-13 (×8): 21 mg via TRANSDERMAL
  Filled 2023-10-06 (×8): qty 1

## 2023-10-06 MED ORDER — SIMVASTATIN 20 MG PO TABS
40.0000 mg | ORAL_TABLET | Freq: Every day | ORAL | Status: DC
Start: 2023-10-06 — End: 2023-10-13
  Administered 2023-10-06 – 2023-10-12 (×7): 40 mg via ORAL
  Filled 2023-10-06 (×7): qty 2

## 2023-10-06 MED ORDER — FLUTICASONE FUROATE-VILANTEROL 100-25 MCG/ACT IN AEPB
1.0000 | INHALATION_SPRAY | Freq: Every day | RESPIRATORY_TRACT | Status: DC
  Administered 2023-10-07 – 2023-10-13 (×7): 1 via RESPIRATORY_TRACT
  Filled 2023-10-06: qty 28

## 2023-10-06 NOTE — Progress Notes (Signed)
 Pt has arrived.

## 2023-10-06 NOTE — Progress Notes (Deleted)
    Jaime Grizzell, is a 78 y.o. male, DOB - Mar 13, 1946, ZOX:[id number]  With H/O CVA, HTN, Dyslipidemia, had VP shunt placed 6 mths ago at [facility] by Dr Lovell Sheehan, now at [facility] for more than usual weakness, some exp.aphasia, seen by Neuro Ct negative, cannot do MRI due to the VP shunt, symptoms most likely due to UTI, per Neuro get MRI at [facility] as MRI can be here with the shunt, Dr Alesia Richards him coming here and he will reprogram the shunt.  Patient with stable Labs and Vitals, UTI better on [place]   There were no vitals filed for this visit.        Signature  Susa Raring M.D on 10/06/2023 at 4:54 PM   -  To page go to [email or web address]

## 2023-10-06 NOTE — H&P (Signed)
 History and Physical    [place] Dube UVO:[id number] DOB: 17-Dec-1945 DOA: 10/06/2023  [facility]: [facility]   Patient coming from: Lives at home   Chief Complaint: Increased weakness and confusion   HPI: Juan Brewer is a 78 y.o. 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  At his baseline, the patient gets around with a walker and is able to perform his ADLs.  For the past week, the patient has been too weak to get up or ambulate on his own, has had increased confusion, and difficulty with ADLs.  His weakness has been more pronounced on the right side.  [facility] Course: Patient was admitted to [facility] on 10/04/2023.  Neurology was consulted and he underwent evaluation that included: Stroke swallow evaluation was passed.  CT head with no acute findings, ventriculostomy present and ventricular size normal.  CTA head and neck with no emergent large vessel occlusion or hemodynamically significant stenosis.  Echocardiogram with normal EF, no thrombus, and no PFO.  Hemoglobin A1c of 6%.  Total cholesterol 110, LDL 55, HDL 30.  He was seen by PT and OT and recommended for inpatient rehabilitation.  Neurology recommended MRI of the brain without contrast but this could not be performed at the sending facility due to the presence of the patient's programmable VP shunt.  Dr. Lovell Sheehan of neurosurgery was contacted and agreed with sending the patient to [facility] for MRI brain.  Review of Systems:  All other systems reviewed and apart from HPI, are negative.  Past Medical History:  Diagnosis Date   Arthritis    back   Hypertension    Stroke (cerebrum) [facility])     Past Surgical History:  Procedure Laterality Date    CHOLECYSTECTOMY     LUMBAR DISC SURGERY  1971   VENTRICULOPERITONEAL SHUNT Right 12/25/2022   Procedure: RIGHT SHUNT INSERTION VENTRICULAR-PERITONEAL;  Surgeon: Tressie Stalker, MD;  Location: [facility] OR;  Service: Neurosurgery;  Laterality: Right;  RM 21PATIENT CANT MOVE UP    Social History:   reports that he has been smoking cigarettes. He has never used smokeless tobacco. He reports that he does not drink alcohol and does not use drugs.  No Known Allergies  Family History  Problem Relation Age of Onset   Dementia Father    Parkinson's disease Father    Healthy Brother    Stroke Neg Hx    Neuropathy Neg Hx      Prior to Admission medications   Medication Sig Start Date End Date Taking? Authorizing Provider  atenolol (TENORMIN) 25 MG tablet Take 25 mg by mouth daily.   Yes [provider]  ALPRAZolam Prudy Feeler) 0.5 MG tablet Take 0.5 mg by mouth 4 (four) times daily. 10/29/20   [provider]  Ascorbic Acid (VITAMIN C PO) Take 1 tablet by mouth daily.    [provider]  clopidogrel (PLAVIX) 75 MG tablet Take 75 mg by mouth daily. 07/07/22   [provider]  docusate sodium (COLACE) 100 MG capsule Take 1 capsule (100 mg total) by mouth 2 (two) times daily. 12/26/22   Tressie Stalker, MD  Fluticasone-Umeclidin-Vilant (TRELEGY ELLIPTA) 100-62.5-25 MCG/ACT AEPB Inhale 1 puff into the lungs daily.    [provider]  ibuprofen (ADVIL)  200 MG tablet Take 200-400 mg by mouth 3 (three) times daily as needed for moderate pain.    [provider]  losartan (COZAAR) 100 MG tablet Take 100 mg by mouth daily. 10/07/22   [provider]  Multiple Vitamins-Minerals (ZINC PO) Take 1 tablet by mouth daily.    [provider]  nicotine (NICODERM CQ - DOSED IN MG/24 HOURS) 14 mg/24hr patch Place 1 patch (14 mg total) onto the skin daily. 01/11/21   Love, Evlyn Kanner, PA-C  simvastatin (ZOCOR) 40 MG tablet Take 40 mg by mouth at bedtime.  10/26/20   [provider]  triazolam (HALCION) 0.25 MG tablet Take 0.25 mg by mouth at bedtime. 06/26/20   [provider]    Physical Exam: Vitals:   10/06/23 2006  BP: 139/85  Pulse: 77  Resp: 16  Temp: 97.7 F (36.5 C)  TempSrc: Oral  SpO2: 97%    Constitutional: NAD, calm  Eyes: PERTLA, lids and conjunctivae normal ENMT: Mucous membranes are moist. Posterior pharynx clear of any exudate or lesions.   Neck: supple, no masses  Respiratory: no wheezing, no crackles. Speaking full sentences.  Cardiovascular: S1 & S2 heard, regular rate and rhythm. No extremity edema.   Abdomen: No distension, no tenderness, soft. Bowel sounds active.  Musculoskeletal: no clubbing / cyanosis. No joint deformity upper and lower extremities.   Skin: no significant rashes, lesions, ulcers. Warm, dry, well-perfused. Neurologic: CN 2-12 grossly intact. Sensation to light touch intact. Strength 4/5 in distal RLE, otherwise 5/5 in all 4 limbs. Alert and oriented to person and place only.  Psychiatric: Pleasant. Cooperative.    Labs and Imaging on Admission: I have personally reviewed following labs and imaging studies  CBC: No results for input(s): "WBC", "NEUTROABS", "HGB", "HCT", "MCV", "PLT" in the last 168 hours. Basic Metabolic Panel: No results for input(s): "NA", "K", "CL", "CO2", "GLUCOSE", "BUN", "CREATININE", "CALCIUM", "MG", "PHOS" in the last 168 hours. GFR: CrCl cannot be calculated (Patient's most recent lab result is older than the maximum 21 days allowed.). Liver Function Tests: No results for input(s): "AST", "ALT", "ALKPHOS", "BILITOT", "PROT", "ALBUMIN" in the last 168 hours. No results for input(s): "LIPASE", "AMYLASE" in the last 168 hours. No results for input(s): "AMMONIA" in the last 168 hours. Coagulation Profile: No results for input(s): "INR", "PROTIME" in the last 168 hours. Cardiac Enzymes: No results for input(s): "CKTOTAL", "CKMB", "CKMBINDEX",  "TROPONINI" in the last 168 hours. BNP (last 3 results) No results for input(s): "PROBNP" in the last 8760 hours. HbA1C: No results for input(s): "HGBA1C" in the last 72 hours. CBG: No results for input(s): "GLUCAP" in the last 168 hours. Lipid Profile: No results for input(s): "CHOL", "HDL", "LDLCALC", "TRIG", "CHOLHDL", "LDLDIRECT" in the last 72 hours. Thyroid Function Tests: No results for input(s): "TSH", "T4TOTAL", "FREET4", "T3FREE", "THYROIDAB" in the last 72 hours. Anemia Panel: No results for input(s): "VITAMINB12", "FOLATE", "FERRITIN", "TIBC", "IRON", "RETICCTPCT" in the last 72 hours. Urine analysis:    Component Value Date/Time   COLORURINE YELLOW 12/21/2020 1855   APPEARANCEUR CLEAR 12/21/2020 1855   LABSPEC 1.020 12/21/2020 1855   PHURINE 7.0 12/21/2020 1855   GLUCOSEU NEGATIVE 12/21/2020 1855   HGBUR NEGATIVE 12/21/2020 1855   BILIRUBINUR NEGATIVE 12/21/2020 1855   KETONESUR NEGATIVE 12/21/2020 1855   PROTEINUR NEGATIVE 12/21/2020 1855   NITRITE NEGATIVE 12/21/2020 1855   LEUKOCYTESUR NEGATIVE 12/21/2020 1855   Sepsis Labs: @LABRCNTIP (procalcitonin:4,lacticidven:4) )No results found for this or any previous visit (from the past 240  hours).   Radiological Exams on Admission: No results found.  EKG: Independently reviewed. SR, LAD, RBBB.   Assessment/Plan   1. Increased weakness and confusion  - Was seen by neurology at [facility], had normal TSH, elevated B12, no acute findings on head CT or CTA head & neck, no thrombus or PFO on echo, and no improvement with treatment of suspected cystitis - MRI could not be completed at [facility] d/t VP shunt; Dr. Lovell Sheehan of neurosurgery at [facility] agreed with patient coming here for MRI and reprogramming of shunt  - Check MRI brain, use delirium precautions, consult PT and OT, continue supportive care   2. Hx of CVA  - Plavix, Zocor    3. Hx of NPH   - S/p VP shunt placed in May 2024  - Normal ventricular size noted  on CT from 10/04/23    4. UTI  - Treated with Rocephin and sending hospital, culture grew mixed flora  - Sxs resolved, will transition to oral antibiotic to complete course   5. COPD  - Not in exacerbation  - ICS-LAMA-LABA    6. Hypertension  - Atenolol    7. Anxiety, insomnia  - Prescription database reviewed, plan to continue home regimen of lorazepam and triazolam   8. Tobacco abuse  - Smoking 1 ppd, trying to cut back     DVT prophylaxis: Lovenox  Code Status: DNR  Level of Care: Level of care: Telemetry Medical Family Communication: Wife at bedside  Disposition Plan:  Patient is from: home  Anticipated d/c is to: TBD Anticipated d/c date is: 3/12 or 10/08/23  Patient currently: Pending MRI brain, therapy assessments  Consults called: None  Admission status: Observation     Briscoe Deutscher, MD [facility]  10/06/2023, 9:17 PM

## 2023-10-07 ENCOUNTER — Other Ambulatory Visit: Payer: Self-pay

## 2023-10-07 ENCOUNTER — Encounter (HOSPITAL_COMMUNITY): Payer: Self-pay | Admitting: Family Medicine

## 2023-10-07 ENCOUNTER — Observation Stay (HOSPITAL_COMMUNITY)

## 2023-10-07 DIAGNOSIS — F419 Anxiety disorder, unspecified: Secondary | ICD-10-CM | POA: Diagnosis not present

## 2023-10-07 DIAGNOSIS — R29818 Other symptoms and signs involving the nervous system: Secondary | ICD-10-CM | POA: Diagnosis not present

## 2023-10-07 DIAGNOSIS — R531 Weakness: Secondary | ICD-10-CM | POA: Diagnosis not present

## 2023-10-07 DIAGNOSIS — J410 Simple chronic bronchitis: Secondary | ICD-10-CM | POA: Diagnosis not present

## 2023-10-07 DIAGNOSIS — G912 (Idiopathic) normal pressure hydrocephalus: Secondary | ICD-10-CM | POA: Diagnosis not present

## 2023-10-07 DIAGNOSIS — R059 Cough, unspecified: Secondary | ICD-10-CM

## 2023-10-07 LAB — BASIC METABOLIC PANEL
Anion gap: 8 (ref 5–15)
BUN: 17 mg/dL (ref 8–23)
CO2: 23 mmol/L (ref 22–32)
Calcium: 9.3 mg/dL (ref 8.9–10.3)
Chloride: 104 mmol/L (ref 98–111)
Creatinine, Ser: 1.3 mg/dL — ABNORMAL HIGH (ref 0.61–1.24)
GFR, Estimated: 57 mL/min — ABNORMAL LOW (ref >60)
Glucose, Bld: 133 mg/dL — ABNORMAL HIGH (ref 70–99)
Potassium: 3.8 mmol/L (ref 3.5–5.1)
Sodium: 135 mmol/L (ref 135–145)

## 2023-10-07 LAB — RESP PANEL BY RT-PCR (RSV, FLU A&B, COVID)  RVPGX2
Influenza A by PCR: NEGATIVE
Influenza B by PCR: NEGATIVE
Resp Syncytial Virus by PCR: NEGATIVE
SARS Coronavirus 2 by RT PCR: NEGATIVE

## 2023-10-07 LAB — CBC
HCT: 35.8 % — ABNORMAL LOW (ref 39.0–52.0)
Hemoglobin: 12.7 g/dL — ABNORMAL LOW (ref 13.0–17.0)
MCH: 31.3 pg (ref 26.0–34.0)
MCHC: 35.5 g/dL (ref 30.0–36.0)
MCV: 88.2 fL (ref 80.0–100.0)
Platelets: 146 10*3/uL — ABNORMAL LOW (ref 150–400)
RBC: 4.06 MIL/uL — ABNORMAL LOW (ref 4.22–5.81)
RDW: 12.5 % (ref 11.5–15.5)
WBC: 9.4 10*3/uL (ref 4.0–10.5)
nRBC: 0 % (ref 0.0–0.2)

## 2023-10-07 MED ORDER — GUAIFENESIN-DM 100-10 MG/5ML PO SYRP: 5.0000 mL | ORAL_SOLUTION | ORAL | Status: DC | PRN

## 2023-10-07 MED ORDER — BENZONATATE 100 MG PO CAPS
200.0000 mg | ORAL_CAPSULE | Freq: Three times a day (TID) | ORAL | Status: DC
  Administered 2023-10-07 – 2023-10-13 (×16): 200 mg via ORAL
  Filled 2023-10-07 (×17): qty 2

## 2023-10-07 MED ORDER — CALCIUM CARBONATE ANTACID 500 MG PO CHEW
1.0000 | CHEWABLE_TABLET | Freq: Two times a day (BID) | ORAL | Status: DC | PRN
  Administered 2023-10-07: 400 mg via ORAL
  Administered 2023-10-08 – 2023-10-11 (×4): 200 mg via ORAL
  Filled 2023-10-07 (×4): qty 1
  Filled 2023-10-07: qty 2
  Filled 2023-10-07: qty 1

## 2023-10-07 MED ORDER — LORAZEPAM 2 MG/ML IJ SOLN: 1.0000 mg | Freq: Once | INTRAMUSCULAR | Status: DC | PRN

## 2023-10-07 MED ORDER — IPRATROPIUM-ALBUTEROL 0.5-2.5 (3) MG/3ML IN SOLN
3.0000 mL | Freq: Four times a day (QID) | RESPIRATORY_TRACT | Status: DC
  Filled 2023-10-07 (×2): qty 3
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
## 2023-10-07 NOTE — Consult Note (Signed)
 NEUROLOGY CONSULT NOTE   Date of service: October 07, 2023 Patient Name: Juan Brewer MRN:  [id number] DOB:  Aug 04, 1945 Chief Complaint: "weakness and confusion" Requesting Provider: Cathren Harsh, MD  History of Present Illness  Juan Brewer is a 78 y.o. 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 Per report for the past week patient has had increased weakness and confusion.   [facility] Course: Patient was admitted to [facility] on 10/04/2023.  Neurology was consulted and he underwent evaluation that included: Stroke swallow evaluation was passed.  CT head with no acute findings, ventriculostomy present and ventricular size normal.  CTA head and neck with no emergent large vessel occlusion or hemodynamically significant stenosis.  Echocardiogram with normal EF, no thrombus, and no PFO.  Hemoglobin A1c of 6%.  Total cholesterol 110, LDL 55, HDL 30.  He was seen by PT and OT and recommended for inpatient rehabilitation. MRI brain was recommended, however could not be obtained due to patients VP shunt and he has been transferred to [facility] for MRI brain   Patient tells me he has had another stroke because he states his right leg feels weaker than normal. On exam he is alert and oriented x 4, no aphasia or dysarthria, no facial droop, subtle right leg weakness sensation is intact, no ataxia  LKW: over a week ago  Modified rankin score: 3-Moderate disability-requires help but walks WITHOUT assistance IV Thrombolysis:  No outside window EVT: No LVO   NIHSS components Score: Comment  1a Level of Conscious 0[x]  1[]  2[]  3[]      1b LOC Questions 0[x]  1[]  2[]       1c LOC Commands 0[x]  1[]  2[]       2 Best Gaze 0[x]  1[]  2[]       3 Visual 0[x]  1[]  2[]  3[]      4 Facial Palsy 0[x]  1[]  2[]  3[]       5a Motor Arm - left 0[x]  1[]  2[]  3[]  4[]  UN[]    5b Motor Arm - Right 0[x]  1[]  2[]  3[]  4[]  UN[]    6a Motor Leg - Left 0[x]  1[]  2[]  3[]  4[]  UN[]    6b Motor Leg - Right 0[]  1[x]  2[]  3[]  4[]  UN[]    7 Limb Ataxia 0[x]  1[]  2[]  3[]  UN[]     8 Sensory 0[x]  1[]  2[]  UN[]      9 Best Language 0[x]  1[]  2[]  3[]      10 Dysarthria 0[x]  1[]  2[]  UN[]      11 Extinct. 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 He has never used smokeless tobacco. He reports that he does not drink alcohol and does not use drugs.  No Known Allergies  Medications   Current Facility-Administered Medications:    acetaminophen (TYLENOL) tablet 650 mg, 650 mg, Oral, Q4H PRN **OR** acetaminophen (TYLENOL) 160 MG/5ML solution 650 mg, 650 mg, Per Tube, Q4H PRN **OR** acetaminophen (TYLENOL) suppository 650 mg, 650 mg, Rectal, Q4H PRN, Opyd, Lavone Neri, MD   ALPRAZolam (XANAX) tablet 0.5 mg, 0.5 mg, Oral, Q6H PRN, Opyd, Lavone Neri, MD   atenolol (TENORMIN) tablet 25 mg, 25 mg, Oral, Daily, Opyd, Timothy S, MD, 25 mg at 10/07/23 1130   cephALEXin (KEFLEX) capsule 500 mg, 500 mg, Oral, Q12H, Opyd, Timothy S, MD, 500 mg at 10/07/23 1130   clopidogrel (PLAVIX) tablet  75 mg, 75 mg, Oral, Daily, Opyd, Timothy S, MD, 75 mg at 10/07/23 1130   enoxaparin (LOVENOX) injection 40 mg, 40 mg, Subcutaneous, Q24H, Opyd, Timothy S, MD, 40 mg at 10/06/23 2218   fluticasone furoate-vilanterol (BREO ELLIPTA) 100-25 MCG/ACT 1 puff, 1 puff, Inhalation, Daily, 1 puff at 10/07/23 0812 **AND** umeclidinium bromide (INCRUSE ELLIPTA) 62.5 MCG/ACT 1 puff, 1 puff, Inhalation, Daily, Opyd, Lavone Neri, MD, 1 puff at 10/07/23 0812   LORazepam (ATIVAN) injection 1 mg, 1 mg, Intravenous, Once PRN, Rai, Ripudeep K, MD   nicotine (NICODERM CQ - dosed in mg/24 hours) patch 21 mg, 21 mg, Transdermal, Daily, Opyd, Timothy S, MD, 21 mg at 10/07/23 1131   prochlorperazine (COMPAZINE) injection 5 mg, 5 mg, Intravenous, Q6H PRN, Opyd, Timothy S, MD   senna-docusate (Senokot-S) tablet 1 tablet, 1 tablet, Oral, QHS PRN, Opyd, Lavone Neri, MD   simvastatin (ZOCOR) tablet 40 mg, 40 mg, Oral, QHS, Opyd, Timothy S, MD, 40 mg at 10/06/23 2217   traZODone (DESYREL) tablet 50 mg, 50 mg, Oral, QHS, Opyd, Timothy S, MD, 50 mg at 10/06/23 2217   triazolam (HALCION) tablet 0.25 mg, 0.25 mg, Oral, QHS, Opyd, Timothy S, MD, 0.25 mg at 10/06/23 2217  Vitals   Vitals:   10/07/23 0339 10/07/23 0812 10/07/23 0820 10/07/23 1130  BP: 102/67  109/74 137/85  Pulse: 77  77 78  Resp: 18  18 18   Temp: (!) 97.4 F (36.3 C)  (!) 97.5 F (36.4 C) 98 F (36.7 C)  TempSrc: Oral  Oral Oral  SpO2: (!) 82% 95% 93% 91%  Height:    5\' 11"  (1.803 m)    Body mass index is 23.43 kg/m.  Physical Exam   Constitutional: Appears well-developed and well-nourished.  Psych: Affect appropriate to situation.  Eyes: No scleral injection.  HENT: No OP obstruction.  Head: Normocephalic.  Cardiovascular: Normal rate and regular rhythm.  Respiratory: Effort normal, non-labored breathing.  GI: Soft.  No distension. There is no tenderness.  Skin: WDI.   Neurologic Examination  Mental Status -  Level of arousal and orientation to  time, place, and person were intact. Language including expression, naming, repetition, comprehension was assessed and found intact. Attention span and concentration were normal. Recent and remote memory were intact. Fund of Knowledge was assessed and was intact.  Cranial Nerves II - XII - II - Visual field intact OU. III, IV, VI - Extraocular movements intact. V - Facial sensation intact bilaterally. VII - Facial movement intact bilaterally. VIII - Hearing & vestibular intact bilaterally.  X - Palate elevates symmetrically. XI - Chin turning & shoulder shrug intact bilaterally. XII - Tongue protrusion intact.  Motor Strength - subtle right leg weakness with slight drift The patient's strength was normal in bilateral uppers and left lower and pronator drift was absent.  Bulk was normal and fasciculations were absent.   Motor Tone - Muscle tone was assessed at the neck and appendages and was normal. Sensory - Light touch, temperature/pinprick were assessed and were symmetrical.   Coordination - The patient had normal movements in the hands and feet with no ataxia or dysmetria.  Tremor was absent. Gait and Station - deferred.  Labs/Imaging/Neurodiagnostic studies   CBC:  Recent Labs  Lab Oct 14, 2023 0603  WBC 9.4  HGB 12.7*  HCT 35.8*  MCV 88.2  PLT 146*   Basic Metabolic Panel:  Lab Results  Component Value Date   NA 135 2023-10-14   K 3.8 10/14/2023   CO2 23 10-14-2023   GLUCOSE 133 (H) Oct 14, 2023   BUN 17 14-Oct-2023   CREATININE 1.30 (H) 2023-10-14   CALCIUM 9.3 October 14, 2023   GFRNONAA 57 (L) 2023-10-14   Lipid Panel:  Lab Results  Component Value Date   LDLCALC 45 12/22/2020   HgbA1c:  Lab Results  Component Value Date   HGBA1C 5.5 12/22/2020   Urine Drug Screen:     Component Value Date/Time   LABOPIA NONE DETECTED 12/21/2020 1854   COCAINSCRNUR NONE DETECTED 12/21/2020 1854   LABBENZ POSITIVE (A) 12/21/2020 1854   AMPHETMU NONE DETECTED 12/21/2020 1854    THCU NONE DETECTED 12/21/2020 1854   LABBARB NONE DETECTED 12/21/2020 1854    Alcohol Level     Component Value Date/Time   ETH <10 12/21/2020 1807   INR  Lab Results  Component Value Date   INR 1.0 06/29/2023   APTT  Lab Results  Component Value Date   APTT 28 12/21/2020   AED levels: No results found for: "PHENYTOIN", "ZONISAMIDE", "LAMOTRIGINE", "LEVETIRACETA"    MRI Brain(Personally reviewed): Pending   Gevena Mart DNP, ACNPC-AG  [facility]  I have seen the patient and reviewed the above note.  He has had worsening ability to use his walker, increasing frequency with urinary incontinence, and increasing confusion.  He had a shunt placed for possible NPH about a year ago, but family reports that he did not see much improvement following the shunt placement.  ASSESSMENT   Juan Brewer is a 78 y.o. 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  There was concern for stroke and he underwent a stroke workup there, but they could not do an MRI because of his VP shunt.  He was transferred to [facility] for MRI.  I have reviewed his MRI, and I do not see any obvious stroke, but will await radiology read.  I do think the ventricles are very slightly larger than they were previously, but given his extensive atrophy I am not sure if they are larger because of atrophy or NPH.  I have discussed this with neurosurgery who may lower his drain pressure, but especially with his lack of response initially, I am not certain this is responsible for his acute worsening.   He has had a significant cough that is worsened over the past week, and the same timeframe is worsening of  his underlying neurological deficits.  My suspicion is that he likely has an acute physiological stressor such as bronchitis, URI,  etc.  This very commonly causes worsening of underlying deficits in people predisposed.  From this point forward, I think treatment would primarily be working with physical therapy/Occupational Therapy/speech therapy.  I do not have any further acute workup recommendations from a neurological perspective at this time.  RECOMMENDATIONS  - continue home Plavix and home statin  - Infectious work up per internal medicine - Appreciate neurosurgical assistance -Neurology will be available on an as-needed basis, please call if further questions or concerns.  _____________________________________________________________________  Ritta Slot, MD [facility]   If 7pm- 7am, please page neurology on call as listed in AMION.
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
## 2023-10-07 NOTE — Procedures (Signed)
   Providing Compassionate, Quality Care - Together   Procedure: Ventriculoperitoneal Shunt Reprogram  Patient: Juan Brewer   Location: 1O10R/6E45W-09  Shunt Manufacturer/Model:  Starting Setting: 70 mmHg prior to MRI  Ending Setting: 60 mmHg  The patient tolerated the procedure well. The final setting was verified on the programmer.    No results found.     Val Eagle, DNP, AGNP-C Nurse Practitioner 10/07/2023 2:57 PM    [facility] [address], [place], [place] [address] P: [phone]    F: [phone]

## 2023-10-07 NOTE — Progress Notes (Addendum)
 [facility]                                                                              Juan Brewer, is a 78 y.o. male, DOB - [birth year], ZOX:[id number] Admit date - 10/06/2023    Outpatient Primary MD for the patient is [facility]  LOS - 1  days  No chief complaint on file.      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 At his baseline, the patient gets around with a walker and is able to perform his ADLs.  For the past week, the patient has been too weak to get up or ambulate on his own, has had increased confusion, and difficulty with ADLs.  His weakness has been more pronounced on the right side.  [facility] Course: Patient was admitted to [facility] on 10/04/2023.  Neurology was consulted and he underwent evaluation that included: Stroke swallow evaluation was passed.  CT head with no acute findings, ventriculostomy present and ventricular size normal.  CTA head and neck with no emergent large vessel occlusion or hemodynamically significant stenosis.  Echocardiogram with normal EF, no thrombus, and no PFO.  Hemoglobin A1c of 6%.  Total cholesterol 110, LDL 55, HDL 30.  He was seen by PT and OT and recommended for inpatient rehabilitation.   Neurology recommended MRI of the brain without contrast but this could not be performed at [facility] due to the presence of the patient's programmable VP shunt.  Dr. Lovell Sheehan of neurosurgery was contacted and agreed with sending the patient to [facility] for MRI brain.   Assessment & Plan    Principal Problem: Weakness with confusion with history of prior CVA - Was seen by neurology at [facility], had normal TSH, elevated B12, no acute findings on head  CT or CTA head & neck, no thrombus or PFO on echo, and no improvement with treatment of suspected cystitis - MRI could not be completed at [facility] d/t VP shunt - Dr. Lovell Sheehan of neurosurgery at [facility] agreed with patient coming here for MRI and reprogramming of shunt  -MRI brain pending today, neurology/Dr Selina Cooley has been consulted, will follow patient. -PT OT evaluation.  Rest of the stroke workup completed at [facility].  Currently on Plavix 75 mg daily, simvastatin 40 mg daily     Hx of NPH   - S/p VP shunt placed in May 2024  - Normal ventricular size noted on CT from 10/04/23     Urinary tract infection - Treated with IV Rocephin at [facility], culture grew mixed flora  -Symptoms resolved, will transition to oral antibiotic to complete course   Cough/bronchitis/COPD exacerbation -Obtain flu/COVID, RSV, obtain chest x-ray - ICS-LAMA-LABA     Hypertension  -Continue atenolol    Anxiety, insomnia  -Continue triazolam, Xanax   Tobacco abuse  - Smoking 1 ppd, trying to  cut back     Pressure injury documentation Right, left mid stage I buttocks, POA  Estimated body mass index is 23.43 kg/m as calculated from the following:   Height as of 07/14/23: 5\' 11"  (1.803 m).   Weight as of 07/14/23: 76.2 kg.  Code Status: DNR DVT Prophylaxis:  enoxaparin (LOVENOX) injection 40 mg Start: 10/06/23 2200   Level of Care: Level of care: Telemetry Medical Family Communication:  Disposition Plan:      Remains inpatient appropriate: Awaiting MRI of the brain, neurosurgery and neurology follow-up, PT evaluation   Procedures:    Consultants:   Neurology Neurosurgery  Antimicrobials:   Anti-infectives (From admission, onward)    Start     Dose/Rate Route Frequency Ordered Stop   10/06/23 2215  cephALEXin (KEFLEX) capsule 500 mg        500 mg Oral Every 12 hours 10/06/23 2117 10/11/23 2159   10/06/23 2200  cefTRIAXone (ROCEPHIN) 1 g in sodium chloride 0.9 % 100  mL IVPB  Status:  Discontinued        1 g 200 mL/hr over 30 Minutes Intravenous Every 24 hours 10/06/23 2058 10/06/23 2117          Medications  atenolol  25 mg Oral Daily   cephALEXin  500 mg Oral Q12H   clopidogrel  75 mg Oral Daily   enoxaparin (LOVENOX) injection  40 mg Subcutaneous Q24H   fluticasone furoate-vilanterol  1 puff Inhalation Daily   And   umeclidinium bromide  1 puff Inhalation Daily   nicotine  21 mg Transdermal Daily   simvastatin  40 mg Oral QHS   traZODone  50 mg Oral QHS   triazolam  0.25 mg Oral QHS      Subjective:   Juan Brewer was seen and examined today.  Denies any specific complaints.  Patient denies dizziness, chest pain, shortness of breath, abdominal pain, N/V/D. No acute events overnight.    Objective:   Vitals:   10/07/23 0339 10/07/23 0812 10/07/23 0820 10/07/23 1130  BP: 102/67  109/74 137/85  Pulse: 77  77 78  Resp: 18  18 18   Temp: (!) 97.4 F (36.3 C)  (!) 97.5 F (36.4 C) 98 F (36.7 C)  TempSrc: Oral  Oral Oral  SpO2: (!) 82% 95% 93% 91%   No intake or output data in the 24 hours ending 10/07/23 1131   Wt Readings from Last 3 Encounters:  07/14/23 76.2 kg  06/29/23 76.2 kg  05/13/23 76.2 kg     Exam General: Alert and oriented, NAD Cardiovascular: S1 S2 auscultated,  RRR Respiratory: Clear to auscultation bilaterally, no wheezing Gastrointestinal: Soft, nontender, nondistended, + bowel sounds Ext: no pedal edema bilaterally Neuro: strength 4/5 in RLE psych: Normal affect     Data Reviewed:  I have personally reviewed following labs    CBC Lab Results  Component Value Date   WBC 9.4 10/07/2023   RBC 4.06 (L) 10/07/2023   HGB 12.7 (L) 10/07/2023   HCT 35.8 (L) 10/07/2023   MCV 88.2 10/07/2023   MCH 31.3 10/07/2023   PLT 146 (L) 10/07/2023   MCHC 35.5 10/07/2023   RDW 12.5 10/07/2023   LYMPHSABS 1.2 05/11/2021   MONOABS 1.2 (H) 05/11/2021   EOSABS 0.1 05/11/2021   BASOSABS 0.0 05/11/2021      Last metabolic panel Lab Results  Component Value Date   NA 135 10/07/2023   K 3.8 10/07/2023   CL 104 10/07/2023   CO2 23  10/07/2023   BUN 17 10/07/2023   CREATININE 1.30 (H) 10/07/2023   GLUCOSE 133 (H) 10/07/2023   GFRNONAA 57 (L) 10/07/2023   CALCIUM 9.3 10/07/2023   PHOS 2.4 (L) 12/24/2020   PROT 7.0 05/11/2021   ALBUMIN 3.2 (L) 05/11/2021   BILITOT 0.7 05/11/2021   ALKPHOS 79 05/11/2021   AST 27 05/11/2021   ALT 32 05/11/2021   ANIONGAP 8 10/07/2023    CBG (last 3)  No results for input(s): "GLUCAP" in the last 72 hours.    Coagulation Profile: No results for input(s): "INR", "PROTIME" in the last 168 hours.   Radiology Studies: I have personally reviewed the imaging studies  No results found.     Thad Ranger M.D. [facility] 10/07/2023, 11:31 AM  Available via Epic secure chat 7am-7pm After 7 pm, please refer to night coverage provider listed on amion.

## 2023-10-07 NOTE — Progress Notes (Signed)
 Inpatient Rehab Admissions Coordinator:   Per therapy recommendations,  patient was screened for CIR candidacy by Megan Salon, MS, CCC-SLP. At this time, work up is ongoing and it is not clear that pt. Has has medical necessity for AIR admit at this time. AC will recscreen for CIR candidacy once work up is complete.  Please contact me any with questions.  Megan Salon, MS, CCC-SLP Rehab Admissions Coordinator  [phone] (celll) [phone] (office)
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
## 2023-10-07 NOTE — Care Management Obs Status (Signed)
 MEDICARE OBSERVATION STATUS NOTIFICATION   Patient Details  Name: Decker Cogdell MRN: [id number] Date of Birth: 1945-12-24   Medicare Observation Status Notification Given:  Yes    Kermit Balo, RN 10/07/2023, 3:25 PM

## 2023-10-07 NOTE — Progress Notes (Signed)
 SLP Cancellation Note  Patient Details Name: Juan Brewer MRN: [id number] DOB: 05/16/1946   Cancelled treatment:       Reason Eval/Treat Not Completed: Patient at procedure or test/unavailable. Will continue attempts at speech-language-cognitive eval.   Royce Macadamia 10/07/2023, 10:18 AM
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
## 2023-10-07 NOTE — Consult Note (Signed)
 Providing Compassionate, Quality Care - Together   Reason for Consult: Gait instability/NPH Referring Physician: Dr. Larey Brick ([facility])  Juan Brewer is an 78 y.o. male.  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 Lovell Sheehan. He presented to [facility] on 10/04/2023 with gait instability, confusion, and urinary incontinence. A stroke work up was performed, without evidence of any large vessel occlusion. Dr. Lovell Sheehan was contacted regarding whether the patient could get a brain MRI. It was confirmed that the patient was fine to get an MRI and could follow up as an outpatient to get his shunt reprogrammed. However, the provider at [facility] felt the patient should transfer to [facility] for his brain MRI so that Neurosurgery could reprogram his shunt. Neurosurgery was consulted for further evaluation and recommendations.  Presently, the patient is resting comfortably in bed. He denies headache, nausea, vomiting, focal weakness, or changes in vision. He ambulates with a walker at baseline per his wife, who is at the bedside. 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  Past Medical History:  Diagnosis Date   Arthritis    back   Hypertension    Stroke (cerebrum) [facility])     Past Surgical History:  Procedure Laterality Date   CHOLECYSTECTOMY     LUMBAR DISC SURGERY  1971   VENTRICULOPERITONEAL SHUNT Right 12/25/2022   Procedure: RIGHT SHUNT INSERTION VENTRICULAR-PERITONEAL;  Surgeon: Tressie Stalker, MD;  Location: [facility] OR;  Service: Neurosurgery;  Laterality: Right;  RM 21PATIENT CANT MOVE UP    Family History  Problem Relation Age of Onset   Dementia Father    Parkinson's disease Father    Healthy Brother    Stroke Neg Hx    Neuropathy Neg Hx     Social History:  reports that he has been smoking cigarettes. He has never used smokeless  tobacco. He reports that he does not drink alcohol and does not use drugs.  Allergies: No Known Allergies  Medications: I have reviewed the patient's current medications.  Results for orders placed or performed during the hospital encounter of 10/06/23 (from the past 48 hours)  CBC     Status: Abnormal   Collection Time: 10/07/23  6:03 AM  Result Value Ref Range   WBC 9.4 4.0 - 10.5 K/uL   RBC 4.06 (L) 4.22 - 5.81 MIL/uL   Hemoglobin 12.7 (L) 13.0 - 17.0 g/dL   HCT 82.9 (L) 56.2 - 13.0 %   MCV 88.2 80.0 - 100.0 fL   MCH 31.3 26.0 - 34.0 pg   MCHC 35.5 30.0 - 36.0 g/dL   RDW 86.5 78.4 - 69.6 %   Platelets 146 (L) 150 - 400 K/uL   nRBC 0.0 0.0 - 0.2 %    Comment: Performed at [facility], [address] [place], [place] [address]  Basic metabolic panel     Status: Abnormal   Collection Time: 10/07/23  6:03 AM  Result Value Ref Range   Sodium 135 135 - 145 mmol/L   Potassium 3.8 3.5 - 5.1 mmol/L   Chloride 104 98 - 111 mmol/L   CO2 23 22 - 32 mmol/L   Glucose, Bld 133 (H) 70 - 99 mg/dL    Comment: Glucose reference range applies only to samples taken after fasting for at least 8 hours.   BUN 17 8 -  23 mg/dL   Creatinine, Ser 2.95 (H) 0.61 - 1.24 mg/dL   Calcium 9.3 8.9 - 28.4 mg/dL   GFR, Estimated 57 (L) >60 mL/min    Comment: (NOTE) Calculated using the CKD-EPI Creatinine Equation (2021)    Anion gap 8 5 - 15    Comment: Performed at [facility], [address], [place], [place] [address]    No results found.  Review of Systems  Constitutional: Negative.   HENT: Negative.    Eyes: Negative.   Respiratory: Negative.    Gastrointestinal: Negative.   Genitourinary:        Incontinence  Musculoskeletal: Negative.   Skin: Negative.   Neurological:        Gait instability  Endo/Heme/Allergies: Negative.   Psychiatric/Behavioral: Negative.     Blood pressure 137/85, pulse 78, temperature 98 F (36.7 C), temperature source Oral, resp. rate 18, height  5\' 11"  (1.803 m), SpO2 91%. Estimated body mass index is 23.43 kg/m as calculated from the following:   Height as of this encounter: 5\' 11"  (1.803 m).   Weight as of 07/14/23: 76.2 kg.  Physical Exam HENT:     Head: Normocephalic and atraumatic.     Mouth/Throat:     Mouth: Mucous membranes are moist.     Pharynx: Oropharynx is clear.  Eyes:     Extraocular Movements: Extraocular movements intact.     Pupils: Pupils are equal, round, and reactive to light.  Cardiovascular:     Rate and Rhythm: Normal rate and regular rhythm.     Pulses: Normal pulses.  Pulmonary:     Effort: Pulmonary effort is normal. No respiratory distress.  Abdominal:     General: Abdomen is flat.     Palpations: Abdomen is soft.  Musculoskeletal:        General: Normal range of motion.     Cervical back: Normal range of motion and neck supple.  Skin:    General: Skin is warm and dry.     Capillary Refill: Capillary refill takes less than 2 seconds.  Neurological:     Mental Status: He is alert and oriented to person, place, and time.     Comments: Slight drift in RLE  Psychiatric:        Mood and Affect: Mood normal.        Behavior: Behavior normal.     Assessment/Plan: Dr. Lovell Sheehan discussed Mr. Price's situation with Dr. Amada Jupiter, who would like to reduce the shunt setting to 60 mmHg. Previous setting was 70 mmHg. This has been done. There is nothing further for Neurosurgery to offer at this time. Please re-consult if we can be of further assistance.  I am in communication with my attending and they agree with the plan for this patient.   Val Eagle, DNP, AGNP-C Nurse Practitioner  [facility] [address], [address], [place], [place] [address] P: [phone]    F: [phone]  10/07/2023, 2:25 PM

## 2023-10-07 NOTE — Plan of Care (Signed)
   Problem: Education: Goal: Knowledge of General Education information will improve Description Including pain rating scale, medication(s)/side effects and non-pharmacologic comfort measures Outcome: Progressing

## 2023-10-07 NOTE — Progress Notes (Deleted)
 Juan Brewer

## 2023-10-08 DIAGNOSIS — J410 Simple chronic bronchitis: Secondary | ICD-10-CM | POA: Diagnosis not present

## 2023-10-08 DIAGNOSIS — M25411 Effusion, right shoulder: Secondary | ICD-10-CM | POA: Diagnosis not present

## 2023-10-08 DIAGNOSIS — R531 Weakness: Secondary | ICD-10-CM | POA: Diagnosis not present

## 2023-10-08 DIAGNOSIS — I69351 Hemiplegia and hemiparesis following cerebral infarction affecting right dominant side: Secondary | ICD-10-CM | POA: Diagnosis not present

## 2023-10-08 DIAGNOSIS — Z85118 Personal history of other malignant neoplasm of bronchus and lung: Secondary | ICD-10-CM | POA: Diagnosis not present

## 2023-10-08 DIAGNOSIS — Z79899 Other long term (current) drug therapy: Secondary | ICD-10-CM | POA: Diagnosis not present

## 2023-10-08 DIAGNOSIS — K5901 Slow transit constipation: Secondary | ICD-10-CM | POA: Diagnosis not present

## 2023-10-08 DIAGNOSIS — Z982 Presence of cerebrospinal fluid drainage device: Secondary | ICD-10-CM | POA: Diagnosis not present

## 2023-10-08 DIAGNOSIS — G479 Sleep disorder, unspecified: Secondary | ICD-10-CM | POA: Diagnosis not present

## 2023-10-08 DIAGNOSIS — R001 Bradycardia, unspecified: Secondary | ICD-10-CM | POA: Diagnosis present

## 2023-10-08 DIAGNOSIS — N179 Acute kidney failure, unspecified: Secondary | ICD-10-CM | POA: Diagnosis present

## 2023-10-08 DIAGNOSIS — M7989 Other specified soft tissue disorders: Secondary | ICD-10-CM | POA: Diagnosis not present

## 2023-10-08 DIAGNOSIS — R5381 Other malaise: Secondary | ICD-10-CM | POA: Diagnosis not present

## 2023-10-08 DIAGNOSIS — J441 Chronic obstructive pulmonary disease with (acute) exacerbation: Secondary | ICD-10-CM | POA: Diagnosis present

## 2023-10-08 DIAGNOSIS — L89321 Pressure ulcer of left buttock, stage 1: Secondary | ICD-10-CM | POA: Diagnosis present

## 2023-10-08 DIAGNOSIS — N3000 Acute cystitis without hematuria: Secondary | ICD-10-CM | POA: Diagnosis not present

## 2023-10-08 DIAGNOSIS — Z9049 Acquired absence of other specified parts of digestive tract: Secondary | ICD-10-CM | POA: Diagnosis not present

## 2023-10-08 DIAGNOSIS — K5900 Constipation, unspecified: Secondary | ICD-10-CM | POA: Diagnosis not present

## 2023-10-08 DIAGNOSIS — I129 Hypertensive chronic kidney disease with stage 1 through stage 4 chronic kidney disease, or unspecified chronic kidney disease: Secondary | ICD-10-CM | POA: Diagnosis present

## 2023-10-08 DIAGNOSIS — E785 Hyperlipidemia, unspecified: Secondary | ICD-10-CM | POA: Diagnosis present

## 2023-10-08 DIAGNOSIS — G4700 Insomnia, unspecified: Secondary | ICD-10-CM | POA: Diagnosis present

## 2023-10-08 DIAGNOSIS — L03113 Cellulitis of right upper limb: Secondary | ICD-10-CM | POA: Diagnosis present

## 2023-10-08 DIAGNOSIS — G8929 Other chronic pain: Secondary | ICD-10-CM | POA: Diagnosis present

## 2023-10-08 DIAGNOSIS — Z66 Do not resuscitate: Secondary | ICD-10-CM | POA: Diagnosis present

## 2023-10-08 DIAGNOSIS — L03115 Cellulitis of right lower limb: Secondary | ICD-10-CM | POA: Diagnosis not present

## 2023-10-08 DIAGNOSIS — I809 Phlebitis and thrombophlebitis of unspecified site: Secondary | ICD-10-CM | POA: Diagnosis not present

## 2023-10-08 DIAGNOSIS — M545 Low back pain, unspecified: Secondary | ICD-10-CM | POA: Diagnosis present

## 2023-10-08 DIAGNOSIS — R509 Fever, unspecified: Secondary | ICD-10-CM | POA: Diagnosis not present

## 2023-10-08 DIAGNOSIS — G912 (Idiopathic) normal pressure hydrocephalus: Secondary | ICD-10-CM | POA: Diagnosis present

## 2023-10-08 DIAGNOSIS — Z820 Family history of epilepsy and other diseases of the nervous system: Secondary | ICD-10-CM | POA: Diagnosis not present

## 2023-10-08 DIAGNOSIS — Z7902 Long term (current) use of antithrombotics/antiplatelets: Secondary | ICD-10-CM | POA: Diagnosis not present

## 2023-10-08 DIAGNOSIS — D696 Thrombocytopenia, unspecified: Secondary | ICD-10-CM | POA: Diagnosis present

## 2023-10-08 DIAGNOSIS — N390 Urinary tract infection, site not specified: Secondary | ICD-10-CM | POA: Diagnosis present

## 2023-10-08 DIAGNOSIS — J90 Pleural effusion, not elsewhere classified: Secondary | ICD-10-CM | POA: Diagnosis not present

## 2023-10-08 DIAGNOSIS — R269 Unspecified abnormalities of gait and mobility: Secondary | ICD-10-CM | POA: Diagnosis not present

## 2023-10-08 DIAGNOSIS — M11211 Other chondrocalcinosis, right shoulder: Secondary | ICD-10-CM | POA: Diagnosis present

## 2023-10-08 DIAGNOSIS — M25511 Pain in right shoulder: Secondary | ICD-10-CM | POA: Diagnosis not present

## 2023-10-08 DIAGNOSIS — I69398 Other sequelae of cerebral infarction: Secondary | ICD-10-CM | POA: Diagnosis not present

## 2023-10-08 DIAGNOSIS — F17210 Nicotine dependence, cigarettes, uncomplicated: Secondary | ICD-10-CM | POA: Diagnosis present

## 2023-10-08 DIAGNOSIS — I10 Essential (primary) hypertension: Secondary | ICD-10-CM | POA: Diagnosis present

## 2023-10-08 DIAGNOSIS — J439 Emphysema, unspecified: Secondary | ICD-10-CM | POA: Diagnosis present

## 2023-10-08 DIAGNOSIS — G9349 Other encephalopathy: Secondary | ICD-10-CM | POA: Diagnosis present

## 2023-10-08 DIAGNOSIS — R410 Disorientation, unspecified: Secondary | ICD-10-CM | POA: Diagnosis present

## 2023-10-08 DIAGNOSIS — F0280 Dementia in other diseases classified elsewhere without behavioral disturbance: Secondary | ICD-10-CM | POA: Diagnosis not present

## 2023-10-08 DIAGNOSIS — G9340 Encephalopathy, unspecified: Secondary | ICD-10-CM | POA: Diagnosis not present

## 2023-10-08 DIAGNOSIS — M19011 Primary osteoarthritis, right shoulder: Secondary | ICD-10-CM | POA: Diagnosis present

## 2023-10-08 DIAGNOSIS — F419 Anxiety disorder, unspecified: Secondary | ICD-10-CM | POA: Diagnosis present

## 2023-10-08 DIAGNOSIS — Z7409 Other reduced mobility: Secondary | ICD-10-CM | POA: Diagnosis present

## 2023-10-08 LAB — CBC
HCT: 36.8 % — ABNORMAL LOW (ref 39.0–52.0)
Hemoglobin: 12.9 g/dL — ABNORMAL LOW (ref 13.0–17.0)
MCH: 30.6 pg (ref 26.0–34.0)
MCHC: 35.1 g/dL (ref 30.0–36.0)
MCV: 87.4 fL (ref 80.0–100.0)
Platelets: 139 10*3/uL — ABNORMAL LOW (ref 150–400)
RBC: 4.21 MIL/uL — ABNORMAL LOW (ref 4.22–5.81)
RDW: 12.5 % (ref 11.5–15.5)
WBC: 7.9 10*3/uL (ref 4.0–10.5)
nRBC: 0 % (ref 0.0–0.2)

## 2023-10-08 LAB — BASIC METABOLIC PANEL
Anion gap: 13 (ref 5–15)
BUN: 18 mg/dL (ref 8–23)
CO2: 22 mmol/L (ref 22–32)
Calcium: 9.1 mg/dL (ref 8.9–10.3)
Chloride: 104 mmol/L (ref 98–111)
Creatinine, Ser: 1.38 mg/dL — ABNORMAL HIGH (ref 0.61–1.24)
GFR, Estimated: 53 mL/min — ABNORMAL LOW (ref >60)
Glucose, Bld: 143 mg/dL — ABNORMAL HIGH (ref 70–99)
Potassium: 3.6 mmol/L (ref 3.5–5.1)
Sodium: 139 mmol/L (ref 135–145)

## 2023-10-08 MED ORDER — CEFUROXIME AXETIL 250 MG PO TABS
500.0000 mg | ORAL_TABLET | ORAL | Status: AC
  Administered 2023-10-08: 500 mg via ORAL
  Filled 2023-10-08: qty 2

## 2023-10-08 MED ORDER — IPRATROPIUM-ALBUTEROL 0.5-2.5 (3) MG/3ML IN SOLN: 3.0000 mL | Freq: Four times a day (QID) | RESPIRATORY_TRACT | Status: DC | PRN

## 2023-10-08 MED ORDER — CEFUROXIME AXETIL 250 MG PO TABS
500.0000 mg | ORAL_TABLET | Freq: Two times a day (BID) | ORAL | Status: DC
  Administered 2023-10-08 – 2023-10-11 (×6): 500 mg via ORAL
  Filled 2023-10-08 (×8): qty 2

## 2023-10-08 NOTE — Progress Notes (Signed)
 Change keflex to cefuroxime 500mg  BID x5d to cover for UTI and brochitis per Dr. Isidoro Donning.    Ulyses Southward, PharmD, BCIDP, AAHIVP, CPP Infectious Disease Pharmacist 10/08/2023 10:13 AM

## 2023-10-08 NOTE — Progress Notes (Signed)
   Inpatient Rehab Admissions Coordinator :  Per therapy  and acute MD request, patient was screened for CIR candidacy by Ottie Glazier RN MSN.  At this time patient appears to be a potential candidate for CIR. I will place a rehab consult per protocol for full assessment. Please call me with any questions.  Ottie Glazier RN MSN Admissions Coordinator [phone]

## 2023-10-08 NOTE — Plan of Care (Signed)
   Problem: Activity: Goal: Risk for activity intolerance will decrease Outcome: Progressing   Problem: Nutrition: Goal: Adequate nutrition will be maintained Outcome: Progressing   Problem: Coping: Goal: Level of anxiety will decrease Outcome: Progressing   Problem: Safety: Goal: Ability to remain free from injury will improve Outcome: Progressing   Problem: Skin Integrity: Goal: Risk for impaired skin integrity will decrease Outcome: Progressing

## 2023-10-08 NOTE — Progress Notes (Signed)
 [facility]                                                                              Juan Brewer, is a 78 y.o. male, DOB - 1946/05/28, ZOX:[id number] Admit date - 10/06/2023    Outpatient Primary MD [facility] the [facility] is [facility]  LOS - 1  days  No chief complaint on file.      Brief summary   Patient is a 78 year old male with HTN, COPD, anxiety, CVA with residual right-sided weakness, NPH status post VP shunt placed in May 2024 who was admitted originally to [facility] on 3/9 with increasing weakness and confusion. At his baseline, the patient gets around with a walker and is able to perform his ADLs.  For the past week, the patient has been too weak to get up or ambulate on his own, has had increased confusion, and difficulty with ADLs.  His weakness has been more pronounced on the right side.  [facility] Course: Patient was admitted to [facility] on 10/04/2023.  Neurology was consulted and he underwent evaluation that included: Stroke swallow evaluation was passed.  CT head with no acute findings, ventriculostomy present and ventricular size normal.  CTA head and neck with no emergent large vessel occlusion or hemodynamically significant stenosis.  Echocardiogram with normal EF, no thrombus, and no PFO.  Hemoglobin A1c of 6%.  Total cholesterol 110, LDL 55, HDL 30.  He was seen by PT and OT and recommended for inpatient rehabilitation.   Neurology recommended MRI of the brain without contrast but this could not be performed at [facility] due to the presence of the patient's programmable VP shunt.  Dr. Lovell Brewer of neurosurgery was contacted and agreed with sending the patient to [facility] for MRI brain.   Assessment & Plan    Principal Problem: Weakness with confusion with history of prior CVA - Was seen by neurology at [facility], had normal TSH, elevated B12, no acute findings on head  CT or CTA head & neck, no thrombus or PFO on echo, and no improvement with treatment of suspected cystitis.  MRI could not be completed at [facility] d/t VP shunt.  -MRI brain completed, no acute intracranial abnormality. -VP shunt was reprogrammed by neurosurgery after the MRI. -Rest of the stroke workup was already done at [facility] including CTA head and neck, 2D echo. -LDL 55, A1c 6 -Continue Plavix 75 mg daily, simvastatin 40 mg daily -PT evaluation recommended CIR     Hx of NPH   - S/p VP shunt placed in May 2024  - Normal ventricular size noted on CT from 10/04/23   -VP shunt reprogrammed by neurosurgery after the MRI   Urinary tract infection - Treated with IV Rocephin at [facility], culture grew mixed flora  -Symptoms resolved, transitioned to oral antibiotic to complete course   Cough/bronchitis/COPD exacerbation -Flu, COVID, RSV negative. -States cough is improved today -Chest x-ray had shown lobular masslike densities in the left upper lobe, discussed in detail with patient's daughter.  Patient has had several  CT scans of the chest, had undergone CT-guided biopsy on 06/29/2023 which was negative for malignancy.  Per daughter, patient's PCP, CT surgery, Dr. Dorris Brewer are on " top of everything", declined any further imaging during this admission. -Continue Tessalon Perles, Robitussin, Breo Ellipta, Incruse Ellipta, scheduled DuoNebs. -Will change antibiotics to cefuroxime   Hypertension  -Continue atenolol    Anxiety, insomnia  -Continue triazolam, Xanax   Tobacco abuse  - Smoking 1 ppd, trying to cut back     Pressure injury documentation Right, left mid stage I buttocks, POA  Estimated body mass index is 23.43 kg/m as calculated from the following:   Height as of this encounter: 5\' 11"  (1.803 m).   Weight as of 07/14/23: 76.2 kg.  Code Status: DNR DVT Prophylaxis:  enoxaparin (LOVENOX) injection 40 mg Start: 10/06/23 2200   Level of  Care: Level of care: Telemetry Medical Family Communication: Updated patient's daughter Disposition Plan:      Remains inpatient appropriate: Awaiting CIR  Procedures:  MRI brain  Consultants:   Neurology Neurosurgery  Antimicrobials:   Anti-infectives (From admission, onward)    Start     Dose/Rate Route Frequency Ordered Stop   10/06/23 2215  cephALEXin (KEFLEX) capsule 500 mg        500 mg Oral Every 12 hours 10/06/23 2117 10/11/23 2159   10/06/23 2200  cefTRIAXone (ROCEPHIN) 1 g in sodium chloride 0.9 % 100 mL IVPB  Status:  Discontinued        1 g 200 mL/hr over 30 Minutes Intravenous Every 24 hours 10/06/23 2058 10/06/23 2117          Medications  atenolol  25 mg Oral Daily   benzonatate  200 mg Oral TID   cephALEXin  500 mg Oral Q12H   clopidogrel  75 mg Oral Daily   enoxaparin (LOVENOX) injection  40 mg Subcutaneous Q24H   fluticasone furoate-vilanterol  1 puff Inhalation Daily   And   umeclidinium bromide  1 puff Inhalation Daily   ipratropium-albuterol  3 mL Nebulization Q6H   nicotine  21 mg Transdermal Daily   simvastatin  40 mg Oral QHS   traZODone  50 mg Oral QHS   triazolam  0.25 mg Oral QHS      Subjective:   Juan Brewer was seen and examined today.  Feeling better today, states cough is almost gone.  No fevers or chills, no acute chest pain shortness of breath, dizziness.  No acute events overnight.  Objective:   Vitals:   10/07/23 2300 10/08/23 0200 10/08/23 0405 10/08/23 0747  BP: 111/67  103/63 125/76  Pulse: 71  60 63  Resp: 18  18 18   Temp: 98.7 F (37.1 C)  (!) 97.4 F (36.3 C) 97.7 F (36.5 C)  TempSrc: Oral  Oral Oral  SpO2: 91%  92% 92%  Height:  5\' 11"  (1.803 m)      Intake/Output Summary (Last 24 hours) at 10/08/2023 1000 Last data filed at 10/08/2023 0900 Gross per 24 hour  Intake 240 ml  Output 650 ml  Net -410 ml     Wt Readings from Last 3 Encounters:  07/14/23 76.2 kg  06/29/23 76.2 kg  05/13/23 76.2 kg    Physical Exam General: Alert and oriented x 3, NAD Cardiovascular: S1 S2 clear, RRR.  Respiratory: CTAB, no wheezing Gastrointestinal: Soft, nontender, nondistended, NBS Ext: no pedal edema bilaterally Neuro: right lower extremity 4/5.  No dysarthria Psych: Normal affect, pleasant  Data Reviewed:  I have personally reviewed following labs    CBC Lab Results  Component Value Date   WBC 9.4 10/07/2023   RBC 4.06 (L) 10/07/2023   HGB 12.7 (L) 10/07/2023   HCT 35.8 (L) 10/07/2023   MCV 88.2 10/07/2023   MCH 31.3 10/07/2023   PLT 146 (L) 10/07/2023   MCHC 35.5 10/07/2023   RDW 12.5 10/07/2023   LYMPHSABS 1.2 05/11/2021   MONOABS 1.2 (H) 05/11/2021   EOSABS 0.1 05/11/2021   BASOSABS 0.0 05/11/2021     Last metabolic panel Lab Results  Component Value Date   NA 135 10/07/2023   K 3.8 10/07/2023   CL 104 10/07/2023   CO2 23 10/07/2023   BUN 17 10/07/2023   CREATININE 1.30 (H) 10/07/2023   GLUCOSE 133 (H) 10/07/2023   GFRNONAA 57 (L) 10/07/2023   CALCIUM 9.3 10/07/2023   PHOS 2.4 (L) 12/24/2020   PROT 7.0 05/11/2021   ALBUMIN 3.2 (L) 05/11/2021   BILITOT 0.7 05/11/2021   ALKPHOS 79 05/11/2021   AST 27 05/11/2021   ALT 32 05/11/2021   ANIONGAP 8 10/07/2023    CBG (last 3)  No results for input(s): "GLUCAP" in the last 72 hours.    Coagulation Profile: No results for input(s): "INR", "PROTIME" in the last 168 hours.   Radiology Studies: I have personally reviewed the imaging studies  MR BRAIN WO CONTRAST Result Date: 10/07/2023 CLINICAL DATA:  Acute neurologic deficit EXAM: MRI HEAD WITHOUT CONTRAST TECHNIQUE: Multiplanar, multiecho pulse sequences of the brain and surrounding structures were obtained without intravenous contrast. COMPARISON:  12/22/2020 FINDINGS: Brain: No acute infarct, mass effect or extra-axial collection. Fewer than 5 scattered microhemorrhages in a nonspecific pattern. There is multifocal hyperintense T2-weighted signal within the  white matter. Generalized volume loss. The midline structures are normal. Right parietal approach shunt catheter traversing the posterior right lateral ventricle, in unchanged position. Vascular: Normal flow voids. Skull and upper cervical spine: Normal calvarium and skull base. Visualized upper cervical spine and soft tissues are normal. Sinuses/Orbits:Moderate paranasal sinus disease. Bilateral ocular lens replacements. IMPRESSION: 1. No acute intracranial abnormality. 2. Right parietal approach shunt catheter traversing the posterior right lateral ventricle, in unchanged position. No hydrocephalus. Electronically Signed   By: Deatra Robinson M.D.   On: 10/07/2023 18:57   DG Chest Port 1 View Result Date: 10/07/2023 CLINICAL DATA:  Cough. EXAM: PORTABLE CHEST 1 VIEW COMPARISON:  May 19, 2023. FINDINGS: The heart size and mediastinal contours are within normal limits. Right-sided ventricular peritoneal shunt is noted. Lobular masslike densities are noted in left upper lobe corresponding to possible malignancy as noted on prior CT scan. Minimal right basilar subsegmental atelectasis or infiltrate is noted. Emphysematous disease is noted. The visualized skeletal structures are unremarkable. IMPRESSION: Lobular masslike densities are again noted in left upper lobe corresponding to possible malignancy as noted on prior CT scan. Minimal right basilar subsegmental atelectasis or scarring is noted. Emphysema (ICD10-J43.9). Electronically Signed   By: Lupita Raider M.D.   On: 10/07/2023 18:49       Malaiah Viramontes M.D. [facility] 10/08/2023, 10:00 AM  Available via Epic secure chat 7am-7pm After 7 pm, please refer to night coverage provider listed on amion.

## 2023-10-09 ENCOUNTER — Inpatient Hospital Stay (HOSPITAL_COMMUNITY)

## 2023-10-09 DIAGNOSIS — G912 (Idiopathic) normal pressure hydrocephalus: Secondary | ICD-10-CM | POA: Diagnosis not present

## 2023-10-09 DIAGNOSIS — R531 Weakness: Secondary | ICD-10-CM | POA: Diagnosis not present

## 2023-10-09 DIAGNOSIS — F419 Anxiety disorder, unspecified: Secondary | ICD-10-CM | POA: Diagnosis not present

## 2023-10-09 DIAGNOSIS — J410 Simple chronic bronchitis: Secondary | ICD-10-CM | POA: Diagnosis not present

## 2023-10-09 LAB — BASIC METABOLIC PANEL
Anion gap: 9 (ref 5–15)
BUN: 21 mg/dL (ref 8–23)
CO2: 22 mmol/L (ref 22–32)
Calcium: 9.3 mg/dL (ref 8.9–10.3)
Chloride: 105 mmol/L (ref 98–111)
Creatinine, Ser: 1.27 mg/dL — ABNORMAL HIGH (ref 0.61–1.24)
GFR, Estimated: 59 mL/min — ABNORMAL LOW (ref >60)
Glucose, Bld: 131 mg/dL — ABNORMAL HIGH (ref 70–99)
Potassium: 3.7 mmol/L (ref 3.5–5.1)
Sodium: 136 mmol/L (ref 135–145)

## 2023-10-09 LAB — CBC
HCT: 37.3 % — ABNORMAL LOW (ref 39.0–52.0)
Hemoglobin: 12.8 g/dL — ABNORMAL LOW (ref 13.0–17.0)
MCH: 30.2 pg (ref 26.0–34.0)
MCHC: 34.3 g/dL (ref 30.0–36.0)
MCV: 88 fL (ref 80.0–100.0)
Platelets: 134 10*3/uL — ABNORMAL LOW (ref 150–400)
RBC: 4.24 MIL/uL (ref 4.22–5.81)
RDW: 12.5 % (ref 11.5–15.5)
WBC: 9.8 10*3/uL (ref 4.0–10.5)
nRBC: 0 % (ref 0.0–0.2)

## 2023-10-09 MED ORDER — MELATONIN 3 MG PO TABS
3.0000 mg | ORAL_TABLET | Freq: Every day | ORAL | Status: DC
  Administered 2023-10-09 – 2023-10-12 (×4): 3 mg via ORAL
  Filled 2023-10-09 (×4): qty 1

## 2023-10-09 NOTE — Progress Notes (Signed)
 Inpatient Rehab Coordinator Note:  I met with patient and his spouse at bedside to discuss CIR recommendations and goals/expectations of CIR stay.  We reviewed 3 hrs/day of therapy, physician follow up, and average length of stay 2 weeks (dependent upon progress) with goals of supervision to min assist.  They are familiar with CIR from admission in 2022 following a CVA.  We reviewed plan for likely 24/7 assist and spouse agrees that they can work this out if needed.  Pt c/o R shoulder pain, which on palpation is where the deltoid attaches to the humerus.  He is unable to demonstrate shoulder flexion or abduction actively without pain.  Dr. Isidoro Donning aware.  Family in agreement to pursue CIR admission and I will have a bed for him available tomorrow as long as he remains stable.    Estill Dooms, PT, DPT Admissions Coordinator [phone] 10/09/23  2:23 PM

## 2023-10-09 NOTE — Plan of Care (Signed)
 Pt alert and oriented x 4. Complains of shoulder pain, see MAR. IV is patient and saline locked. Continent of bowel and bladder. Wife at bedside this shift. Will continue with current plan of care.   Problem: Education: Goal: Knowledge of General Education information will improve Description: Including pain rating scale, medication(s)/side effects and non-pharmacologic comfort measures Outcome: Progressing   Problem: Health Behavior/Discharge Planning: Goal: Ability to manage health-related needs will improve Outcome: Progressing   Problem: Clinical Measurements: Goal: Ability to maintain clinical measurements within normal limits will improve Outcome: Progressing Goal: Will remain free from infection Outcome: Progressing Goal: Diagnostic test results will improve Outcome: Progressing Goal: Respiratory complications will improve Outcome: Progressing Goal: Cardiovascular complication will be avoided Outcome: Progressing   Problem: Activity: Goal: Risk for activity intolerance will decrease Outcome: Progressing   Problem: Nutrition: Goal: Adequate nutrition will be maintained Outcome: Progressing   Problem: Coping: Goal: Level of anxiety will decrease Outcome: Progressing   Problem: Elimination: Goal: Will not experience complications related to bowel motility Outcome: Progressing Goal: Will not experience complications related to urinary retention Outcome: Progressing   Problem: Pain Managment: Goal: General experience of comfort will improve and/or be controlled Outcome: Progressing   Problem: Safety: Goal: Ability to remain free from injury will improve Outcome: Progressing   Problem: Skin Integrity: Goal: Risk for impaired skin integrity will decrease Outcome: Progressing
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
## 2023-10-09 NOTE — PMR Pre-admission (Signed)
 PMR Admission Coordinator Pre-Admission Assessment  Patient: Juan Brewer is an 78 y.o., male MRN: [id number] DOB: October 03, 1945 Height: 5\' 11"  (180.3 cm) Weight:    Insurance Information HMO:     PPO:      PCP:      IPA:      80/20:      OTHER:  PRIMARY: Medicare A/B      Policy#: [id number]       Subscriber: pt CM Name:       Phone#:      Fax#:  Pre-Cert#: verified Health and safety inspector:  Benefits:  Phone #:      Name:  Eff. Date: 12/26/12 A/B     Deduct: $1676      Out of Pocket Max: n/a      Life Max: n/a CIR: 100%      SNF: 20 full days Outpatient: 80%     Co-Pay: 20% Home Health: 100%      Co-Pay:  DME: 80%     Co-Pay: 20% Providers:  SECONDARY: BCBS Supplement      Policy#: [id number]     Phone#: [phone]  Financial Counselor:       Phone#:   The "Data Collection Information Summary" for patients in Inpatient Rehabilitation Facilities with attached "Privacy Act Statement-Health Care Records" was provided and verbally reviewed with: Patient and Family  Emergency Contact Information Contact Information     Name Relation Home Work Mobile   [place] Spouse [phone]      Other Contacts     Name Relation Home Work Mobile   Price,Will Son   [phone]       Current Medical History  Patient Admitting Diagnosis: encephalopathy  History of Present Illness: Pt is a 78 y/o male with PMH of CVA with residual R sided weakness, HTN, COPD, and 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  At [facility] neurology was consulted and stroke workup was essentially negative, except for MRI which they could not perform due to VPS, so he was transferred to [facility] on 3/11.  Neurology consulted and noted R weakness with slight drift.  Neurosurgery consulted for VPS management and reduced setting to 60 mmHg.  Therapy evaluations completed and pt was  recommended for CIR due to functional decline.    Complete NIHSS TOTAL: 1  Patient's medical record from [facility] (no records available from [facility]) has been reviewed by the rehabilitation admission coordinator and physician.  Past Medical History  Past Medical History:  Diagnosis Date   Arthritis    back   Hypertension    Stroke (cerebrum) (HCC)     Has the patient had major surgery during 100 days prior to admission? Yes  Family History   family history includes Dementia in his father; Healthy in his brother; Parkinson's disease in his father.  Current Medications  Current Facility-Administered Medications:    acetaminophen (TYLENOL) tablet 650 mg, 650 mg, Oral, Q4H PRN, 650 mg at 10/09/23 0404 **OR** acetaminophen (TYLENOL) 160 MG/5ML solution 650 mg, 650 mg, Per Tube, Q4H PRN **OR** acetaminophen (TYLENOL) suppository 650 mg, 650 mg, Rectal, Q4H PRN, Opyd, Lavone Neri, MD   ALPRAZolam Prudy Feeler) tablet 0.5 mg, 0.5 mg, Oral, Q6H PRN, Opyd, Lavone Neri, MD, 0.5 mg at 10/09/23 0847   atenolol (TENORMIN) tablet 25 mg, 25 mg, Oral, Daily, Opyd, Lavone Neri, MD, 25 mg at  10/09/23 0842   benzonatate (TESSALON) capsule 200 mg, 200 mg, Oral, TID, Rai, Ripudeep K, MD, 200 mg at 10/09/23 0842   calcium carbonate (TUMS - dosed in mg elemental calcium) chewable tablet 200-400 mg of elemental calcium, 1-2 tablet, Oral, BID PRN, Rai, Ripudeep K, MD, 200 mg of elemental calcium at 10/09/23 1044   [COMPLETED] cefUROXime (CEFTIN) tablet 500 mg, 500 mg, Oral, NOW, 500 mg at 10/08/23 1134 **FOLLOWED BY** cefUROXime (CEFTIN) tablet 500 mg, 500 mg, Oral, BID WC, Pham, Minh Q, RPH-CPP, 500 mg at 10/09/23 4696   clopidogrel (PLAVIX) tablet 75 mg, 75 mg, Oral, Daily, Opyd, Lavone Neri, MD, 75 mg at 10/09/23 0842   enoxaparin (LOVENOX) injection 40 mg, 40 mg, Subcutaneous, Q24H, Opyd, Lavone Neri, MD, 40 mg at 10/07/23 2120   fluticasone furoate-vilanterol (BREO ELLIPTA) 100-25 MCG/ACT 1 puff, 1 puff, Inhalation, Daily,  1 puff at 10/09/23 0736 **AND** umeclidinium bromide (INCRUSE ELLIPTA) 62.5 MCG/ACT 1 puff, 1 puff, Inhalation, Daily, Opyd, Lavone Neri, MD, 1 puff at 10/09/23 0736   guaiFENesin-dextromethorphan (ROBITUSSIN DM) 100-10 MG/5ML syrup 5 mL, 5 mL, Oral, Q4H PRN, Rai, Ripudeep K, MD   ipratropium-albuterol (DUONEB) 0.5-2.5 (3) MG/3ML nebulizer solution 3 mL, 3 mL, Nebulization, Q6H PRN, Rai, Ripudeep K, MD   LORazepam (ATIVAN) injection 1 mg, 1 mg, Intravenous, Once PRN, Rai, Ripudeep K, MD   melatonin tablet 3 mg, 3 mg, Oral, QHS, Rai, Ripudeep K, MD   nicotine (NICODERM CQ - dosed in mg/24 hours) patch 21 mg, 21 mg, Transdermal, Daily, Opyd, Lavone Neri, MD, 21 mg at 10/09/23 2952   prochlorperazine (COMPAZINE) injection 5 mg, 5 mg, Intravenous, Q6H PRN, Opyd, Timothy S, MD   senna-docusate (Senokot-S) tablet 1 tablet, 1 tablet, Oral, QHS PRN, Opyd, Lavone Neri, MD   simvastatin (ZOCOR) tablet 40 mg, 40 mg, Oral, QHS, Opyd, Timothy S, MD, 40 mg at 10/08/23 2132   traZODone (DESYREL) tablet 50 mg, 50 mg, Oral, QHS, Opyd, Timothy S, MD, 50 mg at 10/08/23 2132   triazolam (HALCION) tablet 0.25 mg, 0.25 mg, Oral, QHS, Opyd, Timothy S, MD, 0.25 mg at 10/08/23 2132  Patients Current Diet:  Diet Order             Diet regular Fluid consistency: Thin  Diet effective now                   Precautions / Restrictions Precautions Precautions: Fall Precaution/Restrictions Comments: Fear of Falling Restrictions Weight Bearing Restrictions Per Provider Order: No   Has the patient had 2 or more falls or a fall with injury in the past year? No  Prior Activity Level Limited Community (1-2x/wk): went out with family to the "office" a few times a week, assist to manage stairs, typically mod I with RW at home, mod I for ADLs, assist for iADLs  Prior Functional Level Self Care: Did the patient need help bathing, dressing, using the toilet or eating? Independent  Indoor Mobility: Did the patient need  assistance with walking from room to room (with or without device)? Independent  Stairs: Did the patient need assistance with internal or external stairs (with or without device)? Needed some help  Functional Cognition: Did the patient need help planning regular tasks such as shopping or remembering to take medications? Needed some help  Patient Information Are you of Hispanic, Latino/a,or Spanish origin?: A. No, not of Hispanic, Latino/a, or Spanish origin What is your race?: A. White Do you need or want an interpreter to communicate  with a doctor or health care staff?: 0. No  Patient's Response To:  Health Literacy and Transportation Is the patient able to respond to health literacy and transportation needs?: Yes Health Literacy - How often do you need to have someone help you when you read instructions, pamphlets, or other written material from your doctor or pharmacy?: Never In the past 12 months, has lack of transportation kept you from medical appointments or from getting medications?: No In the past 12 months, has lack of transportation kept you from meetings, work, or from getting things needed for daily living?: No  Journalist, newspaper / Equipment Home Equipment: Grab bars - tub/shower, Information systems manager, Rollator (4 wheels), Cane - single point  Prior Device Use: Indicate devices/aids used by the patient prior to current illness, exacerbation or injury? Walker  Current Functional Level Cognition  Arousal/Alertness: Awake/alert Overall Cognitive Status: Impaired/Different from baseline Orientation Level: Oriented to person, Oriented to place, Oriented to situation Attention: Sustained Sustained Attention: Appears intact Memory: Impaired Memory Impairment: Storage deficit, Retrieval deficit, Decreased recall of new information (scored in moderate impairment on Cognistat (1/4 recalled, needing cues), recalled majority of what neurologist relayed) Awareness: Impaired Awareness  Impairment: Intellectual impairment (states physical deficits but not cognitive) Problem Solving: Appears intact (for verbal, may break down in functional situation) Safety/Judgment: Appears intact (may have difficulty in functional situations)    Extremity Assessment (includes Sensation/Coordination)  Upper Extremity Assessment: Right hand dominant, RUE deficits/detail, Overall WFL for tasks assessed (LUE>RUE) RUE Deficits / Details: hx of 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. sensation intact, able to lift about 60* shoulder flexion against gravity. RUE: Shoulder pain with ROM RUE Sensation: WNL RUE Coordination: WNL  Lower Extremity Assessment: Overall WFL for tasks assessed    ADLs  Overall ADL's : Needs assistance/impaired Eating/Feeding: Independent, Sitting Grooming: Sitting, Contact guard assist, Wash/dry face Upper Body Bathing: Sitting, Contact guard assist Lower Body Bathing: Sitting/lateral leans, Minimal assistance Upper Body Dressing : Sitting, Contact guard assist Lower Body Dressing: Sitting/lateral leans, Contact guard assist Lower Body Dressing Details (indicate cue type and reason): don bilat socks, anticiate need for MAx A to Mod A +2 for sts dressing Toilet Transfer: Maximal assistance, +2 for physical assistance, +2 for safety/equipment, Stand-pivot Toileting- Clothing Manipulation and Hygiene: Maximal assistance, +2 for physical assistance, +2 for safety/equipment Functional mobility during ADLs: +2 for physical assistance, Maximal assistance, +2 for safety/equipment General ADL Comments: Focused session on STS with stedy and promotion of anterior leaning    Mobility  Overal bed mobility: Needs Assistance Bed Mobility: Sit to Supine Rolling: Mod assist, Used rails Sidelying to sit: Max assist, +2 for safety/equipment Supine to sit: Max assist, HOB elevated, Used rails Sit to supine: Mod assist, HOB elevated, Used rails General bed mobility comments: cuing for  sequencing, assistance to move BLE on/off EOB, upright trunk to sitting    Transfers  Overall transfer level: Needs assistance Equipment used: Ambulation equipment used Transfers: Sit to/from Stand, Bed to chair/wheelchair/BSC Sit to Stand: Max assist, From elevated surface, Via lift equipment Bed to/from chair/wheelchair/BSC transfer type:: Via Lift equipment Stand pivot transfers: Mod assist, +2 physical assistance, +2 safety/equipment, From elevated surface Transfer via Lift Equipment: Stedy General transfer comment: STS x2 from recliner max A with cues for hand placement. Pt needing assist to position R hand on stedy. R lateral lean in standing, only correctable with manual facilitation. Max A +2 for stedy transfer to bed, pt getting fatigued and not able to assist as much  with standing from perched position.    Ambulation / Gait / Stairs / Wheelchair Mobility  Ambulation/Gait General Gait Details: Pt declined.    Posture / Balance Balance Overall balance assessment: Needs assistance Sitting-balance support: Feet supported, Single extremity supported Sitting balance-Leahy Scale: Poor Postural control: Right lateral lean, Posterior lean Standing balance support: Bilateral upper extremity supported, During functional activity, Reliant on assistive device for balance Standing balance-Leahy Scale: Zero Standing balance comment: Reliant on therapists    Special needs/care consideration Diabetic management A1C 6   Previous Home Environment (from acute therapy documentation) Living Arrangements: Spouse/significant other  Lives With: Spouse Available Help at Discharge: Family, Available PRN/intermittently (wife works 2-3 days per week) Type of Home: House Home Layout: Able to live on main level with bedroom/bathroom, Two level Alternate Level Stairs-Number of Steps: pt reports he does not need to go upstairs Home Access: Stairs to enter Entrance Stairs-Rails: None, Right Entrance  Stairs-Number of Steps: 1 Bathroom Shower/Tub: Engineer, manufacturing systems: Handicapped height Home Care Services: Yes Type of Home Care Services: Home PT Additional Comments: Spouse notes that pt has had some falls, pt denied having falls  Discharge Living Setting Plans for Discharge Living Setting: Patient's home, Lives with (comment) (spouse) Type of Home at Discharge: House Discharge Home Layout: Able to live on main level with bedroom/bathroom Discharge Home Access: Stairs to enter Entrance Stairs-Rails: Right Entrance Stairs-Number of Steps: 1 Discharge Bathroom Shower/Tub: Tub/shower unit Discharge Bathroom Toilet: Handicapped height Discharge Bathroom Accessibility: Yes How Accessible: Accessible via walker Does the patient have any problems obtaining your medications?: No  Social/Family/Support Systems Patient Roles: Spouse Anticipated Caregiver: Myrtie Hawk Anticipated Caregiver's Contact Information: [phone] Ability/Limitations of Caregiver: n/a Caregiver Availability: 24/7 Discharge Plan Discussed with Primary Caregiver: Yes Is Caregiver In Agreement with Plan?: Yes Does Caregiver/Family have Issues with Lodging/Transportation while Pt is in Rehab?: No  Goals Patient/Family Goal for Rehab: PT/OT/SLP supervision to min assist Expected length of stay: 21-24 days Additional Information: Discharge plan: will discharge home with family providing 24/7 assist if needed Pt/Family Agrees to Admission and willing to participate: Yes Program Orientation Provided & Reviewed with Pt/Caregiver Including Roles  & Responsibilities: Yes  Decrease burden of Care through IP rehab admission: n/a  Possible need for SNF placement upon discharge:  Not anticipated.  Plan for discharge home with family to provide 24/7 assist.   Patient Condition: I have reviewed medical records from [facility], spoken with CSW, and patient, spouse, and family member. I met with patient at the  bedside for inpatient rehabilitation assessment.  Patient will benefit from ongoing PT, OT, and SLP, can actively participate in 3 hours of therapy a day 5 days of the week, and can make measurable gains during the admission.  Patient will also benefit from the coordinated team approach during an Inpatient Acute Rehabilitation admission.  The patient will receive intensive therapy as well as Rehabilitation physician, nursing, social worker, and care management interventions.  Due to bladder management, bowel management, safety, skin/wound care, disease management, medication administration, pain management, and patient education the patient requires 24 hour a day rehabilitation nursing.  The patient is currently max assist +2 with mobility and basic ADLs.  Discharge setting and therapy post discharge at home with home health is anticipated.  Patient has agreed to participate in the Acute Inpatient Rehabilitation Program and will admit Saturday 10/10/23.  Preadmission Screen Completed By:  Stephania Fragmin, PT, DPT 10/09/2023 2:33 PM ______________________________________________________________________   Discussed status with Dr. Wynn Banker  on 10/09/23  at 2:50 PM  and received approval for admission today.  Admission Coordinator:  Stephania Fragmin, PT, DPT time 2:50 PM Dorna Bloom 10/09/23    Assessment/Plan: Diagnosis: Normal pressure hydrocephalus Does the need for close, 24 hr/day Medical supervision in concert with the patient's rehab needs make it unreasonable for this patient to be served in a less intensive setting? Yes Co-Morbidities requiring supervision/potential complications: Prior CVA with chronic right hemiparesis, right wrist pain, severe right glenohumeral osteoarthritis, urinary incontinence, chronic low back pain lumbar postlaminectomy syndrome, history of smoking Due to bladder management, bowel management, safety, skin/wound care, disease management, medication administration, pain  management, and patient education, does the patient require 24 hr/day rehab nursing? Yes Does the patient require coordinated care of a physician, rehab nurse, PT, OT, and SLP to address physical and functional deficits in the context of the above medical diagnosis(es)? Yes Addressing deficits in the following areas: balance, endurance, locomotion, strength, transferring, bowel/bladder control, bathing, dressing, feeding, grooming, toileting, cognition, and psychosocial support Can the patient actively participate in an intensive therapy program of at least 3 hrs of therapy 5 days a week? Yes The potential for patient to make measurable gains while on inpatient rehab is good Anticipated functional outcomes upon discharge from inpatient rehab: supervision and min assist PT, supervision and min assist OT, supervision SLP Estimated rehab length of stay to reach the above functional goals is: 21 to 24 days Anticipated discharge destination: Home 10. Overall Rehab/Functional Prognosis: good   MD Signature: Erick Colace M.D. [facility] Fellow Am Acad of Phys Med and Rehab Diplomate Am Board of Electrodiagnostic Med Fellow Am Board of Interventional Pain
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
## 2023-10-09 NOTE — Progress Notes (Signed)
 [facility]                                                                              Juan Brewer, is a 78 y.o. male, DOB - March 05, 1946, ZOX:[id number] Admit date - 10/06/2023    Outpatient Primary MD for the patient is [facility]  LOS - 2  days  No chief complaint on file.      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 At his baseline, the patient gets around with a walker and is able to perform his ADLs.  For the past week, the patient has been too weak to get up or ambulate on his own, has had increased confusion, and difficulty with ADLs.  His weakness has been more pronounced on the right side.  [facility] Course: Patient was admitted to [facility] on 10/04/2023.  Neurology was consulted and he underwent evaluation that included: Stroke swallow evaluation was passed.  CT head with no acute findings, ventriculostomy present and ventricular size normal.  CTA head and neck with no emergent large vessel occlusion or hemodynamically significant stenosis.  Echocardiogram with normal EF, no thrombus, and no PFO.  Hemoglobin A1c of 6%.  Total cholesterol 110, LDL 55, HDL 30.  He was seen by PT and OT and recommended for inpatient rehabilitation.   Neurology recommended MRI of the brain without contrast but this could not be performed at [facility] due to the presence of the patient's programmable VP shunt.  Dr. Lovell Sheehan of neurosurgery was contacted and agreed with sending the patient to [facility] for MRI brain.   Assessment & Plan    Principal Problem: Weakness with confusion with history of prior CVA - Was seen by neurology at [facility], had normal TSH, elevated B12, no acute findings on head  CT or CTA head & neck, no thrombus or PFO on echo, and no improvement with treatment of suspected cystitis.  MRI could not be completed at [facility] d/t VP shunt.  -MRI brain completed, no acute intracranial abnormality. -VP shunt was reprogrammed by neurosurgery after the MRI. -Rest of the stroke workup was already done at [facility] including CTA head and neck, 2D echo. -LDL 55, A1c 6 -Continue Plavix 75 mg daily, simvastatin 40 mg daily -PT evaluation recommended CIR     Hx of NPH   - S/p VP shunt placed in May 2024  - Normal ventricular size noted on CT from 10/04/23   -VP shunt reprogrammed by neurosurgery after the MRI   Urinary tract infection - Treated with IV Rocephin at [facility], culture grew mixed flora  -Symptoms resolved, transitioned to oral antibiotic to complete course   Cough/bronchitis/COPD exacerbation -Flu, COVID, RSV negative. -States cough is improved today -Chest x-ray had shown lobular masslike densities in the left upper lobe, discussed in detail with patient's daughter.  Patient has had several  CT scans of the chest, had undergone CT-guided biopsy on 06/29/2023 which was negative for malignancy.  Per daughter, patient's PCP, CT surgery, Dr. Dorris Fetch are on " top of everything", declined any further imaging during this admission. -Continue Tessalon Perles, Robitussin, Breo Ellipta, Incruse Ellipta, scheduled DuoNebs. -Continue cefuroxime   Hypertension  -Continue atenolol    Anxiety, insomnia  -Continue triazolam, Xanax -Per patient, did not sleep well, will try melatonin 3 mg   Tobacco abuse  - Smoking 1 ppd, trying to cut back     Pressure injury documentation Right, left mid stage I buttocks, POA  Estimated body mass index is 23.43 kg/m as calculated from the following:   Height as of this encounter: 5\' 11"  (1.803 m).   Weight as of 07/14/23: 76.2 kg.  Code Status: DNR DVT Prophylaxis:  enoxaparin (LOVENOX) injection 40  mg Start: 10/06/23 2200   Level of Care: Level of care: Telemetry Medical Family Communication: Updated patient's daughter yesterday Disposition Plan:      Remains inpatient appropriate: Awaiting CIR  Procedures:  MRI brain  Consultants:   Neurology Neurosurgery  Antimicrobials:   Anti-infectives (From admission, onward)    Start     Dose/Rate Route Frequency Ordered Stop   10/08/23 2000  cefUROXime (CEFTIN) tablet 500 mg       Placed in "Followed by" Linked Group   500 mg Oral 2 times daily with meals 10/08/23 1011 10/13/23 0759   10/08/23 1100  cefUROXime (CEFTIN) tablet 500 mg       Placed in "Followed by" Linked Group   500 mg Oral NOW 10/08/23 1011 10/08/23 1134   10/06/23 2215  cephALEXin (KEFLEX) capsule 500 mg  Status:  Discontinued        500 mg Oral Every 12 hours 10/06/23 2117 10/08/23 1011   10/06/23 2200  cefTRIAXone (ROCEPHIN) 1 g in sodium chloride 0.9 % 100 mL IVPB  Status:  Discontinued        1 g 200 mL/hr over 30 Minutes Intravenous Every 24 hours 10/06/23 2058 10/06/23 2117          Medications  atenolol  25 mg Oral Daily   benzonatate  200 mg Oral TID   cefUROXime  500 mg Oral BID WC   clopidogrel  75 mg Oral Daily   enoxaparin (LOVENOX) injection  40 mg Subcutaneous Q24H   fluticasone furoate-vilanterol  1 puff Inhalation Daily   And   umeclidinium bromide  1 puff Inhalation Daily   nicotine  21 mg Transdermal Daily   simvastatin  40 mg Oral QHS   traZODone  50 mg Oral QHS   triazolam  0.25 mg Oral QHS      Subjective:   Juan Brewer was seen and examined today.  States did not sleep well, feeling very tired today.  States cough is improved, no fever chills, chest pain or shortness of breath.  Objective:   Vitals:   10/08/23 2342 10/09/23 0521 10/09/23 0809 10/09/23 1136  BP: 125/79 129/81 (P) 122/80 119/78  Pulse: 64 65 (P) 62 70  Resp: 18 18 (P) 18 16  Temp: 98 F (36.7 C) 98.4 F (36.9 C) (P) 97.8 F (36.6 C) 98.4 F  (36.9 C)  TempSrc: Oral Oral (P) Oral Oral  SpO2: (!) 89% 92% (P) 91% 93%  Height:        Intake/Output Summary (Last 24 hours) at 10/09/2023 1236 Last data filed at 10/09/2023 0414 Gross per 24 hour  Intake --  Output 1000 ml  Net -1000 ml     Wt Readings from Last 3 Encounters:  07/14/23 76.2 kg  06/29/23 76.2 kg  05/13/23 76.2 kg    Physical Exam General: Alert and oriented x 3, NAD Cardiovascular: S1 S2 clear, RRR.  Respiratory: CTAB Gastrointestinal: Soft, nontender, nondistended, NBS Ext: no pedal edema bilaterally Neuro: no new deficits Skin: No rashes Psych: Normal affect     Data Reviewed:  I have personally reviewed following labs    CBC Lab Results  Component Value Date   WBC 9.8 10/09/2023   RBC 4.24 10/09/2023   HGB 12.8 (L) 10/09/2023   HCT 37.3 (L) 10/09/2023   MCV 88.0 10/09/2023   MCH 30.2 10/09/2023   PLT 134 (L) 10/09/2023   MCHC 34.3 10/09/2023   RDW 12.5 10/09/2023   LYMPHSABS 1.2 05/11/2021   MONOABS 1.2 (H) 05/11/2021   EOSABS 0.1 05/11/2021   BASOSABS 0.0 05/11/2021     Last metabolic panel Lab Results  Component Value Date   NA 136 10/09/2023   K 3.7 10/09/2023   CL 105 10/09/2023   CO2 22 10/09/2023   BUN 21 10/09/2023   CREATININE 1.27 (H) 10/09/2023   GLUCOSE 131 (H) 10/09/2023   GFRNONAA 59 (L) 10/09/2023   CALCIUM 9.3 10/09/2023   PHOS 2.4 (L) 12/24/2020   PROT 7.0 05/11/2021   ALBUMIN 3.2 (L) 05/11/2021   BILITOT 0.7 05/11/2021   ALKPHOS 79 05/11/2021   AST 27 05/11/2021   ALT 32 05/11/2021   ANIONGAP 9 10/09/2023    CBG (last 3)  No results for input(s): "GLUCAP" in the last 72 hours.    Coagulation Profile: No results for input(s): "INR", "PROTIME" in the last 168 hours.   Radiology Studies: I have personally reviewed the imaging studies  DG Chest Port 1 View Result Date: 10/07/2023 CLINICAL DATA:  Cough. EXAM: PORTABLE CHEST 1 VIEW COMPARISON:  May 19, 2023. FINDINGS: The heart size and  mediastinal contours are within normal limits. Right-sided ventricular peritoneal shunt is noted. Lobular masslike densities are noted in left upper lobe corresponding to possible malignancy as noted on prior CT scan. Minimal right basilar subsegmental atelectasis or infiltrate is noted. Emphysematous disease is noted. The visualized skeletal structures are unremarkable. IMPRESSION: Lobular masslike densities are again noted in left upper lobe corresponding to possible malignancy as noted on prior CT scan. Minimal right basilar subsegmental atelectasis or scarring is noted. Emphysema (ICD10-J43.9). Electronically Signed   By: Lupita Raider M.D.   On: 10/07/2023 18:49       Madgeline Rayo M.D. [facility] 10/09/2023, 12:36 PM  Available via Epic secure chat 7am-7pm After 7 pm, please refer to night coverage provider listed on amion.
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
## 2023-10-09 NOTE — H&P (Incomplete)
 Physical Medicine and Rehabilitation Admission H&P    CC: Functional deficits secondary to encephalopathy  HPI: Juan Brewer, Man. 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 Lovell Sheehan in May 2024.  He also has a history of prior CVA with right-sided weakness and presented to [facility] on 10/04/2023 with increased weakness and confusion.  He was admitted there and neurology consulted.  Patient complained of increased right lower extremity weakness greater than baseline.  Stroke swallow evaluation was obtained and he was allowed p.o. intake.  CT head without acute findings, presence of ventriculostomy with normal ventricular size.  CTA of head and neck performed with no emergent large vessel occlusion or hemodynamically significant stenosis.  He also underwent echocardiogram with normal ejection fraction, no thrombus or PFO.  His hemoglobin A1c is 6%.  LDL 55.  Neurology recommended MRI of the brain without contrast but this could not be performed due to the presence of the patient's programmable VP shunt.  He was transferred to [facility] for consultation with Dr. Lovell Sheehan.  On arrival, noted to have significant cough by Dr. Amada Jupiter which had worsened over the past week.  He has suspicion is that patient likely had an acute Visiol stressor such as bronchitis, URI, etc.  Factious workup carried out.  Found to have urinary tract infection and started on Rocephin.  Culture grew mixed flora.  Viral panel negative.  Started on bronchodilators and Tessalon Perles.  Home Plavix and statin continued.  He has significant history of anxiety and insomnia and treated with triazolam and Xanax.  Started on melatonin for sleep.  He has a history of tobacco abuse 1 pack/day and NicoDerm patches placed.  Pressure injury noted of the right left mid buttock.  MRI completed without acute intracranial abnormality.  His VP shunt  was reprogrammed by neurosurgery after the MRI.  Developed acute onset of right shoulder pain on 3/16 with erythema and edema of right forearm and hand. 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 Blood cultures obtained and Ancef IV started. Orthopedic surgery and ID consulted. Planned joint aspiration and ESI but held off on ESI due to fever.   Physical therapy note today, the patient is alert and oriented x 4 but presents with significantly impaired cognition compared to baseline with impaired awareness, impaired initiation and decreased processing. The patient requires inpatient medicine and rehabilitation evaluations and services for ongoing dysfunction secondary to encephalopathy.  ROS Past Medical History:  Diagnosis Date   Arthritis    back   Hypertension    Stroke (cerebrum) [name]    Past Surgical History:  Procedure Laterality Date   CHOLECYSTECTOMY     LUMBAR DISC SURGERY  1971   VENTRICULOPERITONEAL SHUNT Right 12/25/2022   Procedure: RIGHT SHUNT INSERTION VENTRICULAR-PERITONEAL;  Surgeon: Tressie Stalker, MD;  Location: [facility] OR;  Service: Neurosurgery;  Laterality: Right;  RM 21PATIENT CANT MOVE UP   Family History  Problem Relation Age of Onset   Dementia Father    Parkinson's disease Father    Healthy Brother    Stroke Neg Hx    Neuropathy Neg Hx    Social History:  reports that he has been smoking cigarettes. He has never used smokeless tobacco. He reports that he does not drink alcohol and does not use drugs. Allergies: No Known Allergies Medications Prior to Admission  Medication Sig Dispense Refill  acetaminophen (TYLENOL) 500 MG tablet Take 1,000 mg by mouth every 6 (six) hours as needed for mild pain (pain score 1-3) or moderate pain (pain score 4-6).     ALPRAZolam (XANAX) 0.5 MG tablet Take 0.5 mg by mouth 4 (four) times daily.     Ascorbic Acid (VITAMIN C PO) Take 1 tablet by mouth daily.     atenolol (TENORMIN) 25 MG  tablet Take 25 mg by mouth daily.     calcium carbonate (TUMS - DOSED IN MG ELEMENTAL CALCIUM) 500 MG chewable tablet Chew 2 tablets by mouth daily.     clopidogrel (PLAVIX) 75 MG tablet Take 75 mg by mouth daily.     docusate sodium (COLACE) 100 MG capsule Take 1 capsule (100 mg total) by mouth 2 (two) times daily. (Patient taking differently: Take 100 mg by mouth 2 (two) times daily as needed for mild constipation or moderate constipation.) 30 capsule 0   Fluticasone-Umeclidin-Vilant (TRELEGY ELLIPTA) 100-62.5-25 MCG/ACT AEPB Inhale 1 puff into the lungs daily.     ibuprofen (ADVIL) 200 MG tablet Take 400 mg by mouth 3 (three) times daily as needed for moderate pain (pain score 4-6).     losartan (COZAAR) 100 MG tablet Take 100 mg by mouth daily.     Multiple Vitamins-Minerals (ZINC PO) Take 1 tablet by mouth daily.     nicotine (NICODERM CQ - DOSED IN MG/24 HOURS) 14 mg/24hr patch Place 1 patch (14 mg total) onto the skin daily. 28 patch 0   simvastatin (ZOCOR) 40 MG tablet Take 40 mg by mouth at bedtime.     traZODone (DESYREL) 50 MG tablet Take 50 mg by mouth at bedtime.     triazolam (HALCION) 0.25 MG tablet Take 0.25 mg by mouth at bedtime.        Home: Home Living Family/patient expects to be discharged to:: Private residence Living Arrangements: Spouse/significant other Available Help at Discharge: Family, Available PRN/intermittently (wife works 2-3 days per week) Type of Home: House Home Access: Stairs to enter Secretary/administrator of Steps: 1 Entrance Stairs-Rails: None, Right Home Layout: Able to live on main level with bedroom/bathroom, Two level Alternate Level Stairs-Number of Steps: pt reports he does not need to go upstairs Bathroom Shower/Tub: Engineer, manufacturing systems: Handicapped height Home Equipment: Grab bars - tub/shower, Information systems manager, Rollator (4 wheels), Cane - single point Additional Comments: Spouse notes that pt has had some falls, pt denied having  falls  Lives With: Spouse   Functional History: Prior Function Prior Level of Function : Independent/Modified Independent, History of Falls (last six months) Mobility Comments: Mod I with Rollator or spc ADLs Comments: ind  Functional Status:  Mobility: Bed Mobility Overal bed mobility: Needs Assistance Bed Mobility: Sit to Supine Rolling: Mod assist, Used rails Sidelying to sit: Max assist, +2 for safety/equipment Supine to sit: Max assist, HOB elevated, Used rails Sit to supine: Mod assist, HOB elevated, Used rails General bed mobility comments: cuing for sequencing, assistance to move BLE on/off EOB, upright trunk to sitting Transfers Overall transfer level: Needs assistance Equipment used: Ambulation equipment used Transfers: Sit to/from Stand, Bed to chair/wheelchair/BSC Sit to Stand: Max assist, From elevated surface, Via lift equipment Bed to/from chair/wheelchair/BSC transfer type:: Via Lift equipment Stand pivot transfers: Mod assist, +2 physical assistance, +2 safety/equipment, From elevated surface Transfer via Lift Equipment: Stedy General transfer comment: STS x2 from recliner max A with cues for hand placement. Pt needing assist to position R hand on stedy. R lateral  lean in standing, only correctable with manual facilitation. Max A +2 for stedy transfer to bed, pt getting fatigued and not able to assist as much with standing from perched position. Ambulation/Gait General Gait Details: Pt declined.    ADL: ADL Overall ADL's : Needs assistance/impaired Eating/Feeding: Independent, Sitting Grooming: Sitting, Contact guard assist, Wash/dry face Upper Body Bathing: Sitting, Contact guard assist Lower Body Bathing: Sitting/lateral leans, Minimal assistance Upper Body Dressing : Sitting, Contact guard assist Lower Body Dressing: Sitting/lateral leans, Contact guard assist Lower Body Dressing Details (indicate cue type and reason): don bilat socks, anticiate need for  MAx A to Mod A +2 for sts dressing Toilet Transfer: Maximal assistance, +2 for physical assistance, +2 for safety/equipment, Stand-pivot Toileting- Clothing Manipulation and Hygiene: Maximal assistance, +2 for physical assistance, +2 for safety/equipment Functional mobility during ADLs: +2 for physical assistance, Maximal assistance, +2 for safety/equipment General ADL Comments: Focused session on STS with stedy and promotion of anterior leaning  Cognition: Cognition Overall Cognitive Status: Impaired/Different from baseline Arousal/Alertness: Awake/alert Orientation Level: Oriented to person, Oriented to place, Oriented to situation Year: 2025 Day of Week: Incorrect Attention: Sustained Sustained Attention: Appears intact Memory: Impaired Memory Impairment: Storage deficit, Retrieval deficit, Decreased recall of new information (scored in moderate impairment on Cognistat (1/4 recalled, needing cues), recalled majority of what neurologist relayed) Awareness: Impaired Awareness Impairment: Intellectual impairment (states physical deficits but not cognitive) Problem Solving: Appears intact (for verbal, may break down in functional situation) Safety/Judgment: Appears intact (may have difficulty in functional situations) Cognition Arousal: Alert Behavior During Therapy: Anxious Overall Cognitive Status: Impaired/Different from baseline  Physical Exam: Blood pressure 119/78, pulse 70, temperature 98.4 F (36.9 C), temperature source Oral, resp. rate 16, height 5\' 11"  (1.803 m), SpO2 93%. Physical Exam  Results for orders placed or performed during the hospital encounter of 10/06/23 (from the past 48 hours)  CBC     Status: Abnormal   Collection Time: 10/08/23 10:15 AM  Result Value Ref Range   WBC 7.9 4.0 - 10.5 K/uL   RBC 4.21 (L) 4.22 - 5.81 MIL/uL   Hemoglobin 12.9 (L) 13.0 - 17.0 g/dL   HCT 18.8 (L) 41.6 - 60.6 %   MCV 87.4 80.0 - 100.0 fL   MCH 30.6 26.0 - 34.0 pg   MCHC 35.1  30.0 - 36.0 g/dL   RDW 30.1 60.1 - 09.3 %   Platelets 139 (L) 150 - 400 K/uL   nRBC 0.0 0.0 - 0.2 %    Comment: Performed at [facility], [address] [place], [place] [address]  Basic metabolic panel     Status: Abnormal   Collection Time: 10/08/23 10:15 AM  Result Value Ref Range   Sodium 139 135 - 145 mmol/L   Potassium 3.6 3.5 - 5.1 mmol/L   Chloride 104 98 - 111 mmol/L   CO2 22 22 - 32 mmol/L   Glucose, Bld 143 (H) 70 - 99 mg/dL    Comment: Glucose reference range applies only to samples taken after fasting for at least 8 hours.   BUN 18 8 - 23 mg/dL   Creatinine, Ser 3.22 (H) 0.61 - 1.24 mg/dL   Calcium 9.1 8.9 - 02.5 mg/dL   GFR, Estimated 53 (L) >60 mL/min    Comment: (NOTE) Calculated using the CKD-EPI Creatinine Equation (2021)    Anion gap 13 5 - 15    Comment: Performed at [facility], [address] [place], [place] [address]  CBC  Status: Abnormal   Collection Time: 10/09/23  6:56 AM  Result Value Ref Range   WBC 9.8 4.0 - 10.5 K/uL   RBC 4.24 4.22 - 5.81 MIL/uL   Hemoglobin 12.8 (L) 13.0 - 17.0 g/dL   HCT 16.1 (L) 09.6 - 04.5 %   MCV 88.0 80.0 - 100.0 fL   MCH 30.2 26.0 - 34.0 pg   MCHC 34.3 30.0 - 36.0 g/dL   RDW 40.9 81.1 - 91.4 %   Platelets 134 (L) 150 - 400 K/uL   nRBC 0.0 0.0 - 0.2 %    Comment: Performed at [facility], [address], [place], [place] [address]  Basic metabolic panel     Status: Abnormal   Collection Time: 10/09/23  6:56 AM  Result Value Ref Range   Sodium 136 135 - 145 mmol/L   Potassium 3.7 3.5 - 5.1 mmol/L   Chloride 105 98 - 111 mmol/L   CO2 22 22 - 32 mmol/L   Glucose, Bld 131 (H) 70 - 99 mg/dL    Comment: Glucose reference range applies only to samples taken after fasting for at least 8 hours.   BUN 21 8 - 23 mg/dL   Creatinine, Ser 6.21 (H) 0.61 - 1.24 mg/dL   Calcium 9.3 8.9 - 30.8 mg/dL   GFR, Estimated 59 (L) >60 mL/min    Comment: (NOTE) Calculated using the CKD-EPI Creatinine Equation  (2021)    Anion gap 9 5 - 15    Comment: Performed at [facility], [address], [place], [place] [address]   No results found.    Blood pressure 119/78, pulse 70, temperature 98.4 F (36.9 C), temperature source Oral, resp. rate 16, height 5\' 11"  (1.803 m), SpO2 93%.  Medical Problem List and Plan: 1. Functional deficits secondary to ***  -patient may *** shower  -ELOS/Goals: ***  2.  Antithrombotics: -DVT/anticoagulation:  Pharmaceutical: Lovenox  -antiplatelet therapy: Plavix   3. Pain Management: Tylenol as needed  4. Mood/Behavior/Sleep: LCSW to evaluate and provide emotional support  -anxiety: Getting Xanax 0.5 mg every 6 hours as needed  -continue triazolam 0.25 mg daily at bedtime  -continue melatonin 3 mg daily at bedtime  -continue trazodone 50 mg daily at bedtime  -antipsychotic agents: n/a  5. Neuropsych/cognition: This patient *** capable of making decisions on *** own behalf.  6. Skin/Wound Care: Routine skin care checks   7. Fluids/Electrolytes/Nutrition: Routine Is and Os and follow-up chemistries  8: Hypertension: monitor TID and prn  -continue atenolol 25 mg daily  9: Hyperlipidemia: continue statin  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 Lovell Sheehan)  -follows with [facility] (11/26/2023)  11: COPD exacerbation: continue Breo and Incruse Ellipta, Tessalon  12: UTI: mixed flora   -continue course of cefuroxime - end 3/18  13: History of parapneumonic effusion, s/p LUL biopsy  -follows with Dr. Dorris Fetch and plan surveillance CT in 2-3 months  14: Tobacco use: Continue NicoDerm 21 mg  15: AKI atop ? CKD:  -follow-up BMP  16: Thrombocytopenia: Follow-up CBC.  17: Right shoulder pain with glenohumeral joint effusion s/p aspiration  -cell count and gram stain results pending  18: Right forearm cellulitis: on cefazolin  ***  Milinda Antis, PA-C 10/09/2023

## 2023-10-09 NOTE — Progress Notes (Signed)
 Inpatient Rehab Admissions Coordinator:   I have a bed for this pt to admit to CIR on Saturday (3/15).  Dr. Isidoro Donning in agreement.  Rehab MD (Dr. Wynn Banker) to assess pt and confirm admission on Saturday.  Floor RN can call CIR at [phone] for report after 12pm on Saturday.  I have let pt/family and case manager know.    Estill Dooms, PT, DPT Admissions Coordinator [phone] 10/09/23  2:51 PM

## 2023-10-09 NOTE — Plan of Care (Signed)
  Problem: Activity: Goal: Risk for activity intolerance will decrease Outcome: Progressing   Problem: Nutrition: Goal: Adequate nutrition will be maintained Outcome: Progressing   Problem: Coping: Goal: Level of anxiety will decrease Outcome: Progressing   Problem: Elimination: Goal: Will not experience complications related to urinary retention Outcome: Progressing   Problem: Skin Integrity: Goal: Risk for impaired skin integrity will decrease Outcome: Progressing

## 2023-10-09 NOTE — Care Management Important Message (Signed)
 Important Message  Patient Details  Name: Amadu Schlageter MRN: [id number] Date of Birth: Apr 14, 1946   Important Message Given:  Yes - Medicare IM     Sherilyn Banker 10/09/2023, 3:38 PM

## 2023-10-10 ENCOUNTER — Inpatient Hospital Stay (HOSPITAL_COMMUNITY)

## 2023-10-10 DIAGNOSIS — N3000 Acute cystitis without hematuria: Secondary | ICD-10-CM

## 2023-10-10 DIAGNOSIS — J410 Simple chronic bronchitis: Secondary | ICD-10-CM | POA: Diagnosis not present

## 2023-10-10 DIAGNOSIS — F0280 Dementia in other diseases classified elsewhere without behavioral disturbance: Secondary | ICD-10-CM

## 2023-10-10 DIAGNOSIS — R269 Unspecified abnormalities of gait and mobility: Secondary | ICD-10-CM | POA: Diagnosis not present

## 2023-10-10 DIAGNOSIS — G912 (Idiopathic) normal pressure hydrocephalus: Secondary | ICD-10-CM

## 2023-10-10 DIAGNOSIS — J441 Chronic obstructive pulmonary disease with (acute) exacerbation: Secondary | ICD-10-CM | POA: Diagnosis not present

## 2023-10-10 DIAGNOSIS — M25511 Pain in right shoulder: Secondary | ICD-10-CM

## 2023-10-10 DIAGNOSIS — J90 Pleural effusion, not elsewhere classified: Secondary | ICD-10-CM

## 2023-10-10 DIAGNOSIS — R531 Weakness: Secondary | ICD-10-CM | POA: Diagnosis not present

## 2023-10-10 DIAGNOSIS — F419 Anxiety disorder, unspecified: Secondary | ICD-10-CM | POA: Diagnosis not present

## 2023-10-10 LAB — URIC ACID: Uric Acid, Serum: 5.9 mg/dL (ref 3.7–8.6)

## 2023-10-10 LAB — C-REACTIVE PROTEIN: CRP: 8.7 mg/dL — ABNORMAL HIGH (ref <1.0)

## 2023-10-10 LAB — SEDIMENTATION RATE: Sed Rate: 71 mm/h — ABNORMAL HIGH (ref 0–16)

## 2023-10-10 NOTE — Progress Notes (Signed)
 [facility]                                                                              Theodus Ran, is a 78 y.o. male, DOB - 11-Apr-1946, IRC:[id number] Admit date - 10/06/2023    Outpatient Primary MD for the [facility] is [facility]  LOS - 3  days  No chief complaint on file.      Brief summary   Patient is a 78 year old male with HTN, COPD, anxiety, CVA with residual right-sided weakness, NPH status post VP shunt placed in May 2024 who was admitted originally to [facility] on 3/9 with increasing weakness and confusion. At his baseline, the patient gets around with a walker and is able to perform his ADLs.  For the past week, the patient has been too weak to get up or ambulate on his own, has had increased confusion, and difficulty with ADLs.  His weakness has been more pronounced on the right side.  [place] Course: Patient was admitted to [facility] on 10/04/2023.  Neurology was consulted and he underwent evaluation that included: Stroke swallow evaluation was passed.  CT head with no acute findings, ventriculostomy present and ventricular size normal.  CTA head and neck with no emergent large vessel occlusion or hemodynamically significant stenosis.  Echocardiogram with normal EF, no thrombus, and no PFO.  Hemoglobin A1c of 6%.  Total cholesterol 110, LDL 55, HDL 30.  He was seen by PT and OT and recommended for inpatient rehabilitation.   Neurology recommended MRI of the brain without contrast but this could not be performed at [facility] due to the presence of the patient's programmable VP shunt.  Dr. Lovell Sheehan of neurosurgery was contacted and agreed with sending the patient to [facility] for MRI brain.   Assessment & Plan    Principal Problem: Weakness with confusion with history of prior CVA - Was seen by neurology at [facility], had normal TSH, elevated B12, no acute findings on head  CT or CTA head & neck, no thrombus or PFO on echo, and no improvement with treatment of suspected cystitis.  MRI could not be completed at [facility] d/t VP shunt.  -MRI brain completed, no acute intracranial abnormality. -VP shunt was reprogrammed by neurosurgery after the MRI. -Rest of the stroke workup was already done at [facility] including CTA head and neck, 2D echo. -LDL 55, A1c 6 -Continue Plavix 75 mg daily, simvastatin 40 mg daily -PT evaluation recommended CIR  Active problems Right shoulder pain, acute -noted during PT evaluation yesterday evening.   -X-ray showed no acute displaced fracture or dislocation -Examined patient, complaining of significant pain 9/10 on abduction and external rotation.  Denies falling on the shoulder or hitting it anywhere.  States does not usually have chronic shoulder pain, confirmed with patient's wife. -Ordered a right shoulder CT scan to rule out any acute pathology, r/o bursitis, severe osteoarthritis or rotator cuff tear.  -May need orthopedic evaluation pending CT results -Will check ESR, CRP, uric acid     Hx of NPH   -  S/p VP shunt placed in May 2024  - Normal ventricular size noted on CT from 10/04/23   -VP shunt reprogrammed by neurosurgery after the MRI   Urinary tract infection - Treated with IV Rocephin at [facility], culture grew mixed flora  -Symptoms resolved, transitioned to oral antibiotic to complete course   Cough/bronchitis/COPD exacerbation -Flu, COVID, RSV negative. -Chest x-ray had shown lobular masslike densities in the left upper lobe, discussed in detail with patient's daughter.  Patient has had several CT scans of the chest, had undergone CT-guided biopsy on 06/29/2023 which was negative for malignancy.  Per daughter, patient's PCP, CT surgery, Dr. Dorris Fetch are on " top of everything", declined any further imaging during this admission. -Continue Tessalon Perles, Robitussin, Breo Ellipta, Incruse  Ellipta, scheduled DuoNebs. -Continue cefuroxime   Hypertension  -Continue atenolol    Anxiety, insomnia  -Continue triazolam, Xanax -Continue melatonin as needed   Tobacco abuse  - Smoking 1 ppd, trying to cut back     Pressure injury documentation Right, left mid stage I buttocks, POA  Estimated body mass index is 23.43 kg/m as calculated from the following:   Height as of this encounter: 5\' 11"  (1.803 m).   Weight as of 07/14/23: 76.2 kg.  Code Status: DNR DVT Prophylaxis:  enoxaparin (LOVENOX) injection 40 mg Start: 10/06/23 2200   Level of Care: Level of care: Telemetry Medical Family Communication: Updated patient's wife on the phone today Disposition Plan:      Remains inpatient appropriate: Awaiting CIR  Procedures:  MRI brain  Consultants:   Neurology Neurosurgery  Antimicrobials:   Anti-infectives (From admission, onward)    Start     Dose/Rate Route Frequency Ordered Stop   10/08/23 2000  cefUROXime (CEFTIN) tablet 500 mg       Placed in "Followed by" Linked Group   500 mg Oral 2 times daily with meals 10/08/23 1011 10/13/23 0759   10/08/23 1100  cefUROXime (CEFTIN) tablet 500 mg       Placed in "Followed by" Linked Group   500 mg Oral NOW 10/08/23 1011 10/08/23 1134   10/06/23 2215  cephALEXin (KEFLEX) capsule 500 mg  Status:  Discontinued        500 mg Oral Every 12 hours 10/06/23 2117 10/08/23 1011   10/06/23 2200  cefTRIAXone (ROCEPHIN) 1 g in sodium chloride 0.9 % 100 mL IVPB  Status:  Discontinued        1 g 200 mL/hr over 30 Minutes Intravenous Every 24 hours 10/06/23 2058 10/06/23 2117          Medications  atenolol  25 mg Oral Daily   benzonatate  200 mg Oral TID   cefUROXime  500 mg Oral BID WC   clopidogrel  75 mg Oral Daily   enoxaparin (LOVENOX) injection  40 mg Subcutaneous Q24H   fluticasone furoate-vilanterol  1 puff Inhalation Daily   And   umeclidinium bromide  1 puff Inhalation Daily   melatonin  3 mg Oral QHS    nicotine  21 mg Transdermal Daily   simvastatin  40 mg Oral QHS   traZODone  50 mg Oral QHS   triazolam  0.25 mg Oral QHS      Subjective:   Juan Brewer was seen and examined today.  Complaining of right shoulder pain, since yesterday evening with noted during the physical therapy.  X-ray negative.  On examination, complaining of 9/10 pain today, on external rotation and abduction.  No fever chills,  chest pain or shortness of breath.    Objective:   Vitals:   10/09/23 1949 10/09/23 2326 10/10/23 0424 10/10/23 0805  BP: 125/81 102/67 106/69 127/76  Pulse: 78 67 66 70  Resp: 18 16 18 16   Temp: 98.4 F (36.9 C) (!) 97.4 F (36.3 C) 98.1 F (36.7 C) 98.1 F (36.7 C)  TempSrc: Oral Oral Oral Oral  SpO2: 93% 94% 92% 92%  Height:        Intake/Output Summary (Last 24 hours) at 10/10/2023 1036 Last data filed at 10/09/2023 1500 Gross per 24 hour  Intake 320 ml  Output 350 ml  Net -30 ml     Wt Readings from Last 3 Encounters:  07/14/23 76.2 kg  06/29/23 76.2 kg  05/13/23 76.2 kg   Physical Exam General: Alert and oriented x 3, NAD, uncomfortable with pain Cardiovascular: S1 S2 clear, RRR.  Respiratory: CTAB, no wheezing Gastrointestinal: Soft, nontender, nondistended, NBS Ext: no pedal edema bilaterally, ROM decreased (right shoulder), with abduction and external rotation Neuro: no new deficits Psych: uncomfortable with shoulder pain   Data Reviewed:  I have personally reviewed following labs    CBC Lab Results  Component Value Date   WBC 9.8 10/09/2023   RBC 4.24 10/09/2023   HGB 12.8 (L) 10/09/2023   HCT 37.3 (L) 10/09/2023   MCV 88.0 10/09/2023   MCH 30.2 10/09/2023   PLT 134 (L) 10/09/2023   MCHC 34.3 10/09/2023   RDW 12.5 10/09/2023   LYMPHSABS 1.2 05/11/2021   MONOABS 1.2 (H) 05/11/2021   EOSABS 0.1 05/11/2021   BASOSABS 0.0 05/11/2021     Last metabolic panel Lab Results  Component Value Date   NA 136 10/09/2023   K 3.7 10/09/2023   CL  105 10/09/2023   CO2 22 10/09/2023   BUN 21 10/09/2023   CREATININE 1.27 (H) 10/09/2023   GLUCOSE 131 (H) 10/09/2023   GFRNONAA 59 (L) 10/09/2023   CALCIUM 9.3 10/09/2023   PHOS 2.4 (L) 12/24/2020   PROT 7.0 05/11/2021   ALBUMIN 3.2 (L) 05/11/2021   BILITOT 0.7 05/11/2021   ALKPHOS 79 05/11/2021   AST 27 05/11/2021   ALT 32 05/11/2021   ANIONGAP 9 10/09/2023    CBG (last 3)  No results for input(s): "GLUCAP" in the last 72 hours.    Coagulation Profile: No results for input(s): "INR", "PROTIME" in the last 168 hours.   Radiology Studies: I have personally reviewed the imaging studies  DG Shoulder Right Result Date: 10/09/2023 CLINICAL DATA:  144615 Pain 144615 EXAM: RIGHT SHOULDER - 2+ VIEW COMPARISON:  None Available. FINDINGS: There is no evidence of fracture or dislocation. Severe degenerative changes of the right shoulder. Soft tissues are unremarkable. Ventriculoperitoneal shunt partially visualized. Lines overlie the chest. IMPRESSION: No acute displaced fracture or dislocation. Electronically Signed   By: Tish Frederickson M.D.   On: 10/09/2023 19:21       Michelene Keniston M.D. [facility] 10/10/2023, 10:36 AM  Available via Epic secure chat 7am-7pm After 7 pm, please refer to night coverage provider listed on amion.

## 2023-10-10 NOTE — H&P (Signed)
 Physical Medicine and Rehabilitation Admission H&P       CC: Functional deficits secondary to encephalopathy   HPI: Juan Brewer, Juan Brewer. 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 Lovell Sheehan in May 2024.  He also has a history of prior CVA with right-sided weakness and presented to [facility] on 10/04/2023 with increased weakness and confusion.  He was admitted there and neurology consulted.  Patient complained of increased right lower extremity weakness greater than baseline.  Stroke swallow evaluation was obtained and he was allowed p.o. intake.  CT head without acute findings, presence of ventriculostomy with normal ventricular size.  CTA of head and neck performed with no emergent large vessel occlusion or hemodynamically significant stenosis.  He also underwent echocardiogram with normal ejection fraction, no thrombus or PFO.  His hemoglobin A1c is 6%.  LDL 55.  Neurology recommended MRI of the brain without contrast but this could not be performed due to the presence of the patient's programmable VP shunt.  He was transferred to [facility] for consultation with Dr. Lovell Sheehan.  On arrival, noted to have significant cough by Dr. Amada Jupiter which had worsened over the past week.  He has suspicion is that patient likely had an acute Visiol stressor such as bronchitis, URI, etc.  Factious workup carried out.  Found to have urinary tract infection and started on Rocephin.  Culture grew mixed flora.  Viral panel negative.  Started on bronchodilators and Tessalon Perles.  Home Plavix and statin continued.  He has significant history of anxiety and insomnia and treated with triazolam and Xanax.  Started on melatonin for sleep.  He has a history of tobacco abuse 1 pack/day and NicoDerm patches placed.  Pressure injury noted of the right left mid buttock.  MRI completed without acute intracranial abnormality.  His VP  shunt was reprogrammed by neurosurgery after the MRI.  Physical therapy note today, the patient is alert and oriented x 4 but presents with significantly impaired cognition compared to baseline with impaired awareness, impaired initiation and decreased processing. The patient requires inpatient medicine and rehabilitation evaluations and services for ongoing dysfunction secondary to encephalopathy.   ROS     Past Medical History:  Diagnosis Date   Arthritis      back   Hypertension     Stroke (cerebrum) [facility])               Past Surgical History:  Procedure Laterality Date   CHOLECYSTECTOMY       LUMBAR DISC SURGERY   1971   VENTRICULOPERITONEAL SHUNT Right 12/25/2022    Procedure: RIGHT SHUNT INSERTION VENTRICULAR-PERITONEAL;  Surgeon: Tressie Stalker, MD;  Location: [facility] OR;  Service: Neurosurgery;  Laterality: Right;  RM 21PATIENT CANT MOVE UP             Family History  Problem Relation Age of Onset   Dementia Father     Parkinson's disease Father     Healthy Brother     Stroke Neg Hx     Neuropathy Neg Hx          Social History:  reports that he has been smoking cigarettes. He has never used smokeless tobacco. He reports that he does not drink alcohol and does not use drugs. Allergies:  Allergies  No Known Allergies         Medications Prior to Admission  Medication Sig Dispense Refill   acetaminophen (  TYLENOL) 500 MG tablet Take 1,000 mg by mouth every 6 (six) hours as needed for mild pain (pain score 1-3) or moderate pain (pain score 4-6).       ALPRAZolam (XANAX) 0.5 MG tablet Take 0.5 mg by mouth 4 (four) times daily.       Ascorbic Acid (VITAMIN C PO) Take 1 tablet by mouth daily.       atenolol (TENORMIN) 25 MG tablet Take 25 mg by mouth daily.       calcium carbonate (TUMS - DOSED IN MG ELEMENTAL CALCIUM) 500 MG chewable tablet Chew 2 tablets by mouth daily.       clopidogrel (PLAVIX) 75 MG tablet Take 75 mg by mouth daily.       docusate sodium (COLACE) 100  MG capsule Take 1 capsule (100 mg total) by mouth 2 (two) times daily. (Patient taking differently: Take 100 mg by mouth 2 (two) times daily as needed for mild constipation or moderate constipation.) 30 capsule 0   Fluticasone-Umeclidin-Vilant (TRELEGY ELLIPTA) 100-62.5-25 MCG/ACT AEPB Inhale 1 puff into the lungs daily.       ibuprofen (ADVIL) 200 MG tablet Take 400 mg by mouth 3 (three) times daily as needed for moderate pain (pain score 4-6).       losartan (COZAAR) 100 MG tablet Take 100 mg by mouth daily.       Multiple Vitamins-Minerals (ZINC PO) Take 1 tablet by mouth daily.       nicotine (NICODERM CQ - DOSED IN MG/24 HOURS) 14 mg/24hr patch Place 1 patch (14 mg total) onto the skin daily. 28 patch 0   simvastatin (ZOCOR) 40 MG tablet Take 40 mg by mouth at bedtime.       traZODone (DESYREL) 50 MG tablet Take 50 mg by mouth at bedtime.       triazolam (HALCION) 0.25 MG tablet Take 0.25 mg by mouth at bedtime.                  Home: Home Living Family/patient expects to be discharged to:: Private residence Living Arrangements: Spouse/significant other Available Help at Discharge: Family, Available PRN/intermittently (wife works 2-3 days per week) Type of Home: House Home Access: Stairs to enter Secretary/administrator of Steps: 1 Entrance Stairs-Rails: None, Right Home Layout: Able to live on main level with bedroom/bathroom, Two level Alternate Level Stairs-Number of Steps: pt reports he does not need to go upstairs Bathroom Shower/Tub: Engineer, manufacturing systems: Handicapped height Home Equipment: Grab bars - tub/shower, Information systems manager, Rollator (4 wheels), Cane - single point Additional Comments: Spouse notes that pt has had some falls, pt denied having falls  Lives With: Spouse   Functional History: Prior Function Prior Level of Function : Independent/Modified Independent, History of Falls (last six months) Mobility Comments: Mod I with Rollator or spc ADLs Comments:  ind   Functional Status:  Mobility: Bed Mobility Overal bed mobility: Needs Assistance Bed Mobility: Sit to Supine Rolling: Mod assist, Used rails Sidelying to sit: Max assist, +2 for safety/equipment Supine to sit: Max assist, HOB elevated, Used rails Sit to supine: Mod assist, HOB elevated, Used rails General bed mobility comments: cuing for sequencing, assistance to move BLE on/off EOB, upright trunk to sitting Transfers Overall transfer level: Needs assistance Equipment used: Ambulation equipment used Transfers: Sit to/from Stand, Bed to chair/wheelchair/BSC Sit to Stand: Max assist, From elevated surface, Via lift equipment Bed to/from chair/wheelchair/BSC transfer type:: Via Lift equipment Stand pivot transfers: Mod assist, +2 physical assistance, +2  safety/equipment, From elevated surface Transfer via Lift Equipment: Stedy General transfer comment: STS x2 from recliner max A with cues for hand placement. Pt needing assist to position R hand on stedy. R lateral lean in standing, only correctable with manual facilitation. Max A +2 for stedy transfer to bed, pt getting fatigued and not able to assist as much with standing from perched position. Ambulation/Gait General Gait Details: Pt declined.   ADL: ADL Overall ADL's : Needs assistance/impaired Eating/Feeding: Independent, Sitting Grooming: Sitting, Contact guard assist, Wash/dry face Upper Body Bathing: Sitting, Contact guard assist Lower Body Bathing: Sitting/lateral leans, Minimal assistance Upper Body Dressing : Sitting, Contact guard assist Lower Body Dressing: Sitting/lateral leans, Contact guard assist Lower Body Dressing Details (indicate cue type and reason): don bilat socks, anticiate need for MAx A to Mod A +2 for sts dressing Toilet Transfer: Maximal assistance, +2 for physical assistance, +2 for safety/equipment, Stand-pivot Toileting- Clothing Manipulation and Hygiene: Maximal assistance, +2 for physical  assistance, +2 for safety/equipment Functional mobility during ADLs: +2 for physical assistance, Maximal assistance, +2 for safety/equipment General ADL Comments: Focused session on STS with stedy and promotion of anterior leaning   Cognition: Cognition Overall Cognitive Status: Impaired/Different from baseline Arousal/Alertness: Awake/alert Orientation Level: Oriented to person, Oriented to place, Oriented to situation Year: 2025 Day of Week: Incorrect Attention: Sustained Sustained Attention: Appears intact Memory: Impaired Memory Impairment: Storage deficit, Retrieval deficit, Decreased recall of new information (scored in moderate impairment on Cognistat (1/4 recalled, needing cues), recalled majority of what neurologist relayed) Awareness: Impaired Awareness Impairment: Intellectual impairment (states physical deficits but not cognitive) Problem Solving: Appears intact (for verbal, may break down in functional situation) Safety/Judgment: Appears intact (may have difficulty in functional situations) Cognition Arousal: Alert Behavior During Therapy: Anxious Overall Cognitive Status: Impaired/Different from baseline   Physical Exam: Blood pressure 119/78, pulse 70, temperature 98.4 F (36.9 C), temperature source Oral, resp. rate 16, height 5\' 11"  (1.803 m), SpO2 93%. Physical Exam  General: No acute distress, frail-appearing Mood and affect are appropriate Heart: Regular rate and rhythm no rubs murmurs or extra sounds Lungs: Clear to auscultation, breathing unlabored, no rales or wheezes Abdomen: Positive bowel sounds, soft nontender to palpation, nondistended Extremities: No clubbing, cyanosis, or edema Skin: No evidence of breakdown, no evidence of rash Neurologic: Oriented to person place, month but not day and date, motor strength is 4/5 in bilateral deltoid, bicep, tricep, grip, hip flexor, knee extensors, ankle dorsiflexor and plantar flexor Sensory exam normal sensation  to light touch in bilateral upper and lower extremities Cerebellar exam normal finger to nose to finger as well as heel to shin in bilateral upper and lower extremities Musculoskeletal: Full range of motion in all 4 extremities. No joint swelling    Lab Results Last 48 Hours        Results for orders placed or performed during the hospital encounter of 10/06/23 (from the past 48 hours)  CBC     Status: Abnormal    Collection Time: 10/08/23 10:15 AM  Result Value Ref Range    WBC 7.9 4.0 - 10.5 K/uL    RBC 4.21 (L) 4.22 - 5.81 MIL/uL    Hemoglobin 12.9 (L) 13.0 - 17.0 g/dL    HCT 16.1 (L) 09.6 - 52.0 %    MCV 87.4 80.0 - 100.0 fL    MCH 30.6 26.0 - 34.0 pg    MCHC 35.1 30.0 - 36.0 g/dL    RDW 04.5 40.9 - 81.1 %  Platelets 139 (L) 150 - 400 K/uL    nRBC 0.0 0.0 - 0.2 %      Comment: Performed at [facility], [address] [place], [place] [address]  Basic metabolic panel     Status: Abnormal    Collection Time: 10/08/23 10:15 AM  Result Value Ref Range    Sodium 139 135 - 145 mmol/L    Potassium 3.6 3.5 - 5.1 mmol/L    Chloride 104 98 - 111 mmol/L    CO2 22 22 - 32 mmol/L    Glucose, Bld 143 (H) 70 - 99 mg/dL      Comment: Glucose reference range applies only to samples taken after fasting for at least 8 hours.    BUN 18 8 - 23 mg/dL    Creatinine, Ser 0.86 (H) 0.61 - 1.24 mg/dL    Calcium 9.1 8.9 - 57.8 mg/dL    GFR, Estimated 53 (L) >60 mL/min      Comment: (NOTE) Calculated using the CKD-EPI Creatinine Equation (2021)      Anion gap 13 5 - 15      Comment: Performed at [facility], [address], [place], [place] [address]  CBC     Status: Abnormal    Collection Time: 10/09/23  6:56 AM  Result Value Ref Range    WBC 9.8 4.0 - 10.5 K/uL    RBC 4.24 4.22 - 5.81 MIL/uL    Hemoglobin 12.8 (L) 13.0 - 17.0 g/dL    HCT 95.2 (L) 84.1 - 52.0 %    MCV 88.0 80.0 - 100.0 fL    MCH 30.2 26.0 - 34.0 pg    MCHC 34.3 30.0 - 36.0 g/dL    RDW 32.4 40.1 - 02.7 %     Platelets 134 (L) 150 - 400 K/uL    nRBC 0.0 0.0 - 0.2 %      Comment: Performed at [facility], [address], [place], [place] [address]  Basic metabolic panel     Status: Abnormal    Collection Time: 10/09/23  6:56 AM  Result Value Ref Range    Sodium 136 135 - 145 mmol/L    Potassium 3.7 3.5 - 5.1 mmol/L    Chloride 105 98 - 111 mmol/L    CO2 22 22 - 32 mmol/L    Glucose, Bld 131 (H) 70 - 99 mg/dL      Comment: Glucose reference range applies only to samples taken after fasting for at least 8 hours.    BUN 21 8 - 23 mg/dL    Creatinine, Ser 4.40 (H) 0.61 - 1.24 mg/dL    Calcium 9.3 8.9 - 34.7 mg/dL    GFR, Estimated 59 (L) >60 mL/min      Comment: (NOTE) Calculated using the CKD-EPI Creatinine Equation (2021)      Anion gap 9 5 - 15      Comment: Performed at [facility], [address], [place], [place] [address]      Imaging Results (Last 48 hours)  No results found.         Blood pressure 119/78, pulse 70, temperature 98.4 F (36.9 C), temperature source Oral, resp. rate 16, height 5\' 11"  (1.803 m), SpO2 93%.   Medical Problem List and Plan: 1. Functional deficits secondary to normal pressure hydrocephalus             -patient may  shower             -  ELOS/Goals: 21 to 24 days PT, OT, speech therapy evaluations in a.m. 2.  Antithrombotics: -DVT/anticoagulation:  Pharmaceutical: Lovenox             -antiplatelet therapy: Plavix    3. Pain Management: Tylenol as needed   4. Mood/Behavior/Sleep: LCSW to evaluate and provide emotional support             -anxiety: Getting Xanax 0.5 mg every 6 hours as needed             -continue triazolam 0.25 mg daily at bedtime             -continue melatonin 3 mg daily at bedtime             -continue trazodone 50 mg daily at bedtime             -antipsychotic agents: n/a   5. Neuropsych/cognition: This patient is not capable of making decisions on his own behalf.   6. Skin/Wound Care: Routine skin care  checks   7. Fluids/Electrolytes/Nutrition: Routine Is and Os and follow-up chemistries   8: Hypertension: monitor TID and prn             -continue atenolol 25 mg daily, adjust BP meds as needed   9: Hyperlipidemia: continue statin   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 Lovell Sheehan)             -follows with [facility] (11/26/2023)   11: COPD exacerbation: continue Breo and Incruse Ellipta, Tessalon, appears improved no respiratory complaints today   12: UTI: mixed flora              -continue course of cefuroxime - end 3/18   13: History of parapneumonic effusion, s/p LUL biopsy             -follows with Dr. Dorris Fetch and plan surveillance CT in 2-3 months   14: Tobacco use: Continue NicoDerm 21 mg   15: AKI atop ? CKD:             -follow-up BMP   16: Thrombocytopenia: Follow-up CBC.   Milinda Antis, PA-C 10/09/2023 "I have personally performed a face to face diagnostic evaluation of this patient.  Additionally, I have reviewed and concur with the physician assistant's documentation above."  Erick Colace M.D. [facility] Fellow Am Acad of Phys Med and Rehab Diplomate Am Board of Electrodiagnostic Med Fellow Am Board of Interventional Pain

## 2023-10-10 NOTE — Progress Notes (Signed)
 Inpatient Rehab Admissions Coordinator:    Holding CIR admit for today. I will follow up tomorrow for potential admit.   Megan Salon, MS, CCC-SLP Rehab Admissions Coordinator  [phone] (celll) [phone] (office)

## 2023-10-10 NOTE — Plan of Care (Signed)
 Pt  is Alert & oriented X 3-4 .Denies any discomfort or pain at this time. Reports feeling comfortable  no signs of distress observed.Vital signs are stable. Will continue to monitor call light within reach.  Problem: Education: Goal: Knowledge of General Education information will improve Description: Including pain rating scale, medication(s)/side effects and non-pharmacologic comfort measures Outcome: Progressing   Problem: Health Behavior/Discharge Planning: Goal: Ability to manage health-related needs will improve Outcome: Progressing   Problem: Clinical Measurements: Goal: Ability to maintain clinical measurements within normal limits will improve Outcome: Progressing Goal: Will remain free from infection Outcome: Progressing Goal: Diagnostic test results will improve Outcome: Progressing Goal: Respiratory complications will improve Outcome: Progressing Goal: Cardiovascular complication will be avoided Outcome: Progressing   Problem: Activity: Goal: Risk for activity intolerance will decrease Outcome: Progressing   Problem: Nutrition: Goal: Adequate nutrition will be maintained Outcome: Progressing   Problem: Coping: Goal: Level of anxiety will decrease Outcome: Progressing   Problem: Elimination: Goal: Will not experience complications related to bowel motility Outcome: Progressing Goal: Will not experience complications related to urinary retention Outcome: Progressing   Problem: Pain Managment: Goal: General experience of comfort will improve and/or be controlled Outcome: Progressing   Problem: Safety: Goal: Ability to remain free from injury will improve Outcome: Progressing   Problem: Skin Integrity: Goal: Risk for impaired skin integrity will decrease Outcome: Progressing

## 2023-10-10 NOTE — Plan of Care (Signed)
  Problem: Clinical Measurements: Goal: Ability to maintain clinical measurements within normal limits will improve Outcome: Progressing Goal: Will remain free from infection Outcome: Progressing Goal: Diagnostic test results will improve Outcome: Progressing Goal: Respiratory complications will improve Outcome: Progressing Goal: Cardiovascular complication will be avoided Outcome: Progressing   Problem: Health Behavior/Discharge Planning: Goal: Ability to manage health-related needs will improve Outcome: Progressing   Problem: Nutrition: Goal: Adequate nutrition will be maintained Outcome: Progressing   Problem: Safety: Goal: Ability to remain free from injury will improve Outcome: Progressing   Problem: Skin Integrity: Goal: Risk for impaired skin integrity will decrease Outcome: Progressing   Problem: Elimination: Goal: Will not experience complications related to bowel motility Outcome: Progressing Goal: Will not experience complications related to urinary retention Outcome: Progressing   Problem: Nutrition: Goal: Adequate nutrition will be maintained Outcome: Progressing

## 2023-10-11 DIAGNOSIS — R531 Weakness: Secondary | ICD-10-CM | POA: Diagnosis not present

## 2023-10-11 DIAGNOSIS — G912 (Idiopathic) normal pressure hydrocephalus: Secondary | ICD-10-CM | POA: Diagnosis not present

## 2023-10-11 DIAGNOSIS — F419 Anxiety disorder, unspecified: Secondary | ICD-10-CM | POA: Diagnosis not present

## 2023-10-11 DIAGNOSIS — J410 Simple chronic bronchitis: Secondary | ICD-10-CM | POA: Diagnosis not present

## 2023-10-11 LAB — CBC
HCT: 33.7 % — ABNORMAL LOW (ref 39.0–52.0)
Hemoglobin: 11.8 g/dL — ABNORMAL LOW (ref 13.0–17.0)
MCH: 30.5 pg (ref 26.0–34.0)
MCHC: 35 g/dL (ref 30.0–36.0)
MCV: 87.1 fL (ref 80.0–100.0)
Platelets: 117 10*3/uL — ABNORMAL LOW (ref 150–400)
RBC: 3.87 MIL/uL — ABNORMAL LOW (ref 4.22–5.81)
RDW: 12.6 % (ref 11.5–15.5)
WBC: 12 10*3/uL — ABNORMAL HIGH (ref 4.0–10.5)
nRBC: 0 % (ref 0.0–0.2)

## 2023-10-11 LAB — URINALYSIS, W/ REFLEX TO CULTURE (INFECTION SUSPECTED)
Bacteria, UA: NONE SEEN
Bilirubin Urine: NEGATIVE
Glucose, UA: NEGATIVE mg/dL
Ketones, ur: NEGATIVE mg/dL
Leukocytes,Ua: NEGATIVE
Nitrite: NEGATIVE
Protein, ur: NEGATIVE mg/dL
Specific Gravity, Urine: 1.014 (ref 1.005–1.030)
pH: 5 (ref 5.0–8.0)

## 2023-10-11 LAB — RENAL FUNCTION PANEL
Albumin: 2.5 g/dL — ABNORMAL LOW (ref 3.5–5.0)
Anion gap: 9 (ref 5–15)
BUN: 21 mg/dL (ref 8–23)
CO2: 22 mmol/L (ref 22–32)
Calcium: 8.9 mg/dL (ref 8.9–10.3)
Chloride: 104 mmol/L (ref 98–111)
Creatinine, Ser: 1.42 mg/dL — ABNORMAL HIGH (ref 0.61–1.24)
GFR, Estimated: 52 mL/min — ABNORMAL LOW (ref >60)
Glucose, Bld: 140 mg/dL — ABNORMAL HIGH (ref 70–99)
Phosphorus: 2.4 mg/dL — ABNORMAL LOW (ref 2.5–4.6)
Potassium: 3.9 mmol/L (ref 3.5–5.1)
Sodium: 135 mmol/L (ref 135–145)

## 2023-10-11 LAB — PROCALCITONIN: Procalcitonin: 0.16 ng/mL

## 2023-10-11 MED ORDER — LACTATED RINGERS IV SOLN: INTRAVENOUS | Status: AC

## 2023-10-11 MED ORDER — POLYETHYLENE GLYCOL 3350 17 G PO PACK
17.0000 g | PACK | Freq: Every day | ORAL | Status: DC
  Administered 2023-10-11 – 2023-10-13 (×3): 17 g via ORAL
  Filled 2023-10-11 (×3): qty 1

## 2023-10-11 MED ORDER — CEFAZOLIN SODIUM-DEXTROSE 2-4 GM/100ML-% IV SOLN
2.0000 g | Freq: Three times a day (TID) | INTRAVENOUS | Status: DC
  Administered 2023-10-11 – 2023-10-13 (×6): 2 g via INTRAVENOUS
  Filled 2023-10-11 (×6): qty 100

## 2023-10-11 NOTE — Consult Note (Addendum)
 Orthopaedic Trauma Service (OTS) Consultation   Patient ID: Juan Brewer MRN: [id number] DOB/AGE: Mar 08, 1946 78 y.o.   Reason for Consult: right shoulder pain Referring Physician: Thad Ranger MD  HPI: Juan Brewer is an 78 y.o. male who was scheduled for discharge to CIR today but new onset progression of right shoulder pain. This am also some new redness and edema of the forearm and hand. Patient states he does not know when they removed the IV from the right antecubital space but dressing is in place there. Denies fever, chills, recent injury. Pain is mild at rest and worse with motion primarily at the wrist. Plain films and CT show advanced endstage DJD with loose body above the coracoid.  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 He has never used smokeless tobacco. He reports that he does not drink alcohol and does not use drugs.  Allergies: No Known Allergies  Medications: I have reviewed the patient's current medications.  Results for orders placed or performed during the hospital encounter of 10/06/23 (from the past 48 hours)  Sedimentation rate     Status: Abnormal   Collection Time: 10/10/23  2:40 PM  Result Value Ref Range   Sed Rate 71 (H) 0 - 16 mm/hr    Comment: Performed at [facility], [address] [place], [place]  [address]  C-reactive protein     Status: Abnormal   Collection Time: 10/10/23  2:40 PM  Result Value Ref Range   CRP 8.7 (H) <1.0 mg/dL    Comment: Performed at [facility], [address] [place], [place] [address]  Uric acid     Status: None   Collection Time: 10/10/23  2:40 PM  Result Value Ref Range   Uric Acid, Serum 5.9 3.7 - 8.6 mg/dL    Comment: Performed at [facility], [address] [place], [place] [address]  Renal function panel     Status: Abnormal   Collection Time: 10/11/23  6:30 AM  Result Value Ref Range   Sodium 135 135 - 145 mmol/L   Potassium 3.9 3.5 - 5.1 mmol/L   Chloride 104 98 - 111 mmol/L   CO2 22 22 - 32 mmol/L   Glucose, Bld 140 (H) 70 - 99 mg/dL    Comment: Glucose reference range applies only to samples taken after fasting for at least 8 hours.   BUN 21 8 - 23 mg/dL   Creatinine, Ser 6.44 (H) 0.61 - 1.24 mg/dL   Calcium 8.9 8.9 - 03.4 mg/dL   Phosphorus 2.4 (L) 2.5 - 4.6 mg/dL   Albumin 2.5 (L) 3.5 - 5.0 g/dL   GFR, Estimated 52 (L) >60 mL/min    Comment: (  NOTE) Calculated using the CKD-EPI Creatinine Equation (2021)    Anion gap 9 5 - 15    Comment: Performed at [facility], [address], [place], [place] [address]  CBC     Status: Abnormal   Collection Time: 10/11/23  6:30 AM  Result Value Ref Range   WBC 12.0 (H) 4.0 - 10.5 K/uL   RBC 3.87 (L) 4.22 - 5.81 MIL/uL   Hemoglobin 11.8 (L) 13.0 - 17.0 g/dL   HCT 60.4 (L) 54.0 - 98.1 %   MCV 87.1 80.0 - 100.0 fL   MCH 30.5 26.0 - 34.0 pg   MCHC 35.0 30.0 - 36.0 g/dL   RDW 19.1 47.8 - 29.5 %   Platelets 117 (L) 150 - 400 K/uL   nRBC 0.0 0.0 - 0.2 %    Comment: Performed at [facility], [address] [place], [place] [address]    CT SHOULDER RIGHT WO CONTRAST Result Date: 10/10/2023 CLINICAL DATA:  Chronic shoulder pain EXAM: CT OF THE UPPER RIGHT EXTREMITY WITHOUT CONTRAST TECHNIQUE: Multidetector CT imaging of the upper right extremity was performed according to  the standard protocol. RADIATION DOSE REDUCTION: This exam was performed according to the departmental dose-optimization program which includes automated exposure control, adjustment of the mA and/or kV according to patient size and/or use of iterative reconstruction technique. COMPARISON:  X-ray 10/09/2023 FINDINGS: Bones/Joint/Cartilage No acute fracture. No dislocation. Severe glenohumeral joint osteoarthritis manifested by joint space narrowing, subchondral sclerosis/cystic change, and marginal osteophyte formation. Mild superior glenoid retroversion. A moderate sized glenohumeral joint effusion is evident. 1.4 x 0.9 cm ossified loose body in the subscapularis joint recess. Smaller intra-articular loose bodies at the posterior joint line of the glenohumeral joint. Mild-moderate degenerative changes of the acromioclavicular joint. No large subacromial-subdeltoid bursal fluid collection. Remaining visualized osseous structures appear grossly intact. Ligaments Suboptimally assessed by CT. Muscles and Tendons Preserved muscle bulk of the rotator cuff musculature without significant atrophy or fatty infiltration. Rotator cuff tendons appear grossly intact within the limitations of CT. Soft tissues No soft tissue fluid collection or hematoma. No axillary lymphadenopathy. Severe emphysematous changes within the included right lung field. IMPRESSION: 1. Severe glenohumeral joint osteoarthritis. Moderate glenohumeral joint effusion containing several intra-articular loose bodies. 2. Mild-moderate degenerative changes of the acromioclavicular joint. 3. Severe emphysematous changes within the included right lung field (ICD10-J43.9). Electronically Signed   By: Duanne Guess D.O.   On: 10/10/2023 17:48   DG Shoulder Right Result Date: 10/09/2023 CLINICAL DATA:  144615 Pain 144615 EXAM: RIGHT SHOULDER - 2+ VIEW COMPARISON:  None Available. FINDINGS: There is no evidence of fracture or dislocation. Severe degenerative  changes of the right shoulder. Soft tissues are unremarkable. Ventriculoperitoneal shunt partially visualized. Lines overlie the chest. IMPRESSION: No acute displaced fracture or dislocation. Electronically Signed   By: Tish Frederickson M.D.   On: 10/09/2023 19:21    Intake/Output      03/15 0701 03/16 0700 03/16 0701 03/17 0700   P.O.     Total Intake     Urine     Total Output     Net             ROS Extensive see above, Denies recent fever. Blood pressure 115/73, pulse 71, temperature 99.2 F (37.3 C), temperature source Oral, resp. rate 16, height 5\' 11"  (1.803 m), SpO2 90%. Physical Exam Very pleasant, appreciate, and comfortable at rest. RUEx: No traumatic wounds, antecubital iv drsg in place, redness and edema of  the distal forearm extending onto the hand Rad 2+ Reports intact Ax/R/M/U and is able to generate some motor activity Gentle rotation of the glenohumeral joint is very well tolerated as is flexion to 45 degrees Elbow motion well tolerated Pain with wrist and hand motion RLE scattered redness and ulceration of skin but no edema, no knee effusion, intact motor with flex/ext of great and lesser toes; no observed block to motion LLE scattered redness and ulceration of skin but no edema, no knee effusion, intact motor with flex/ext of great and lesser toes; no observed block to motion Gait: could not assess Coordination and balance: could not assess   Assessment/Plan:  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 Certainly fine to proceed with transfer to CIR 2. Have spoken with Dr Isidoro Donning requesting MD re cellulitis and she has started IV antibiotics; we will continue monitoring of the forearm and wrist but this appears minor and early and should reliably resolve 3. Aspiration and steroid injection of the right shoulder has been ordered and scheduled for tomorrow 4. Should symptoms persist after  injection happy to see in the office for further f/u  Myrene Galas, MD [facility], [facility] [phone]  10/11/2023, 9:37 AM  [facility] [address] [place] [address] [phone] [phone] (F)    After 5pm and on the weekends please log on to Amion, go to orthopaedics and the look under the [facility] Call for the provider(s) on call. You can also call our office at [phone] and then follow the prompts to be connected to the call team.

## 2023-10-11 NOTE — Plan of Care (Signed)

## 2023-10-11 NOTE — Progress Notes (Addendum)
 [facility]                                                                              Huber Mathers, is a 78 y.o. male, DOB - 02/23/1946, ZOX:[id number] Admit date - 10/06/2023    Outpatient Primary MD for the patient is Center, [facility]  LOS - 4  days  No chief complaint on file.      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 At his baseline, the patient gets around with a walker and is able to perform his ADLs.  For the past week, the patient has been too weak to get up or ambulate on his own, has had increased confusion, and difficulty with ADLs.  His weakness has been more pronounced on the right side.  [facility] Course: Patient was admitted to [facility] on 10/04/2023.  Neurology was consulted and he underwent evaluation that included: Stroke swallow evaluation was passed.  CT head with no acute findings, ventriculostomy present and ventricular size normal.  CTA head and neck with no emergent large vessel occlusion or hemodynamically significant stenosis.  Echocardiogram with normal EF, no thrombus, and no PFO.  Hemoglobin A1c of 6%.  Total cholesterol 110, LDL 55, HDL 30.  He was seen by PT and OT and recommended for inpatient rehabilitation.   Neurology recommended MRI of the brain without contrast but this could not be performed at [facility] due to the presence of the patient's programmable VP shunt.  Dr. Lovell Sheehan of neurosurgery was contacted and agreed with sending the patient to [facility] for MRI brain.   Assessment & Plan    Principal Problem: Weakness with confusion with history of prior CVA - Was seen by neurology at [facility], had normal TSH, elevated B12, no acute findings on head  CT or CTA head & neck, no thrombus or PFO on echo, and no improvement with treatment of suspected cystitis.  MRI could not be completed at [facility] d/t VP shunt.  -MRI brain completed, no acute intracranial abnormality. -VP shunt was reprogrammed by neurosurgery after the MRI. -Rest of the stroke workup was already done at [facility] including CTA head and neck, 2D echo. -LDL 55, A1c 6 -Continue Plavix 75 mg daily, simvastatin 40 mg daily -PT evaluation recommended CIR  Active problems Right shoulder pain, acute -X-ray showed no acute displaced fracture or dislocation -Right shoulder CT showed severe glenohumeral joint osteoarthritis, moderate glenohumeral joint effusion containing several intra-articular loose bodies, mild to moderate degenerative changes of the acromioclavicular joint -Orthopedics consulted, seen by Dr. Carola Frost, recommended IR joint aspiration and ESI,. D/w IR, plan for procedure in a.m. -Also developing right forearm redness, edema, concern for early cellulitis, given elevated ESR, CRP.  Uric acid 5.9, procalcitonin mildly elevated 0.16, placed on IV Ancef -Obtain venous Dopplers RUE to rule out any SVT/DVT     Hx of  NPH   - S/p VP shunt placed in May 2024  - Normal ventricular size noted on CT from 10/04/23   -VP shunt reprogrammed by neurosurgery after the MRI   Urinary tract infection - Treated with IV Rocephin at [facility], culture grew mixed flora  -Symptoms resolved, transitioned to oral antibiotic to complete course   Cough/bronchitis/COPD exacerbation -Flu, COVID, RSV negative. -Chest x-ray had shown lobular masslike densities in the left upper lobe, discussed in detail with patient's daughter.  Patient has had several CT scans of the chest, had undergone CT-guided biopsy on 06/29/2023 which was negative for malignancy.  Per daughter, patient's PCP, CT surgery, Dr. Dorris Fetch are on " top of everything", declined any further imaging during  this admission. -Continue Tessalon Perles, Robitussin, Breo Ellipta, Incruse Ellipta, scheduled DuoNebs. -Wheezing resolved   Hypertension  -Continue atenolol    Anxiety, insomnia  -Continue triazolam, Xanax -Continue melatonin as needed   Tobacco abuse  - Smoking 1 ppd, trying to cut back     Pressure injury documentation Right, left mid stage I buttocks, POA  Estimated body mass index is 23.43 kg/m as calculated from the following:   Height as of this encounter: 5\' 11"  (1.803 m).   Weight as of 07/14/23: 76.2 kg.  Code Status: DNR DVT Prophylaxis:  enoxaparin (LOVENOX) injection 40 mg Start: 10/06/23 2200   Level of Care: Level of care: Telemetry Medical Family Communication: Updated patient's wife on the phone today Disposition Plan:      Remains inpatient appropriate: CIR tomorrow if shoulder/right forearm stable   Procedures:  MRI brain  Consultants:   Neurology Neurosurgery  Antimicrobials:   Anti-infectives (From admission, onward)    Start     Dose/Rate Route Frequency Ordered Stop   10/11/23 1400  ceFAZolin (ANCEF) IVPB 2g/100 mL premix        2 g 200 mL/hr over 30 Minutes Intravenous Every 8 hours 10/11/23 0955 10/18/23 1359   10/08/23 2000  cefUROXime (CEFTIN) tablet 500 mg  Status:  Discontinued       Placed in "Followed by" Linked Group   500 mg Oral 2 times daily with meals 10/08/23 1011 10/11/23 0955   10/08/23 1100  cefUROXime (CEFTIN) tablet 500 mg       Placed in "Followed by" Linked Group   500 mg Oral NOW 10/08/23 1011 10/08/23 1134   10/06/23 2215  cephALEXin (KEFLEX) capsule 500 mg  Status:  Discontinued        500 mg Oral Every 12 hours 10/06/23 2117 10/08/23 1011   10/06/23 2200  cefTRIAXone (ROCEPHIN) 1 g in sodium chloride 0.9 % 100 mL IVPB  Status:  Discontinued        1 g 200 mL/hr over 30 Minutes Intravenous Every 24 hours 10/06/23 2058 10/06/23 2117          Medications  atenolol  25 mg Oral Daily   benzonatate  200 mg  Oral TID   clopidogrel  75 mg Oral Daily   enoxaparin (LOVENOX) injection  40 mg Subcutaneous Q24H   fluticasone furoate-vilanterol  1 puff Inhalation Daily   And   umeclidinium bromide  1 puff Inhalation Daily   melatonin  3 mg Oral QHS   nicotine  21 mg Transdermal Daily   simvastatin  40 mg Oral QHS   traZODone  50 mg Oral QHS   triazolam  0.25 mg Oral QHS      Subjective:   [name] was seen and  examined today.  Complaining of right shoulder pain, now right hand/forearm pain, swelling and redness.  No fever chills, chest pain or shortness of breath.  No fevers.   Objective:   Vitals:   10/10/23 0805 10/10/23 1952 10/11/23 0346 10/11/23 0852  BP: 127/76 125/82 102/65 115/73  Pulse: 70 76 68 71  Resp: 16 18 18 16   Temp: 98.1 F (36.7 C) 98.2 F (36.8 C) 99.6 F (37.6 C) 99.2 F (37.3 C)  TempSrc: Oral Oral Oral Oral  SpO2: 92% 92% 91% 90%  Height:       No intake or output data in the 24 hours ending 10/11/23 1142    Wt Readings from Last 3 Encounters:  07/14/23 76.2 kg  06/29/23 76.2 kg  05/13/23 76.2 kg   Physical Exam General: Alert and oriented x 3, NAD Cardiovascular: S1 S2 clear, RRR.  Respiratory: CTAB, no wheezing Gastrointestinal: Soft, nontender, nondistended, NBS Ext: no pedal edema bilaterally, right shoulder pain, ROM dec, right forearm/hand redness, swelling   Neuro: no new deficits Psych: uncomfortable   Data Reviewed:  I have personally reviewed following labs    CBC Lab Results  Component Value Date   WBC 12.0 (H) 10/11/2023   RBC 3.87 (L) 10/11/2023   HGB 11.8 (L) 10/11/2023   HCT 33.7 (L) 10/11/2023   MCV 87.1 10/11/2023   MCH 30.5 10/11/2023   PLT 117 (L) 10/11/2023   MCHC 35.0 10/11/2023   RDW 12.6 10/11/2023   LYMPHSABS 1.2 05/11/2021   MONOABS 1.2 (H) 05/11/2021   EOSABS 0.1 05/11/2021   BASOSABS 0.0 05/11/2021     Last metabolic panel Lab Results  Component Value Date   NA 135 10/11/2023   K 3.9  10/11/2023   CL 104 10/11/2023   CO2 22 10/11/2023   BUN 21 10/11/2023   CREATININE 1.42 (H) 10/11/2023   GLUCOSE 140 (H) 10/11/2023   GFRNONAA 52 (L) 10/11/2023   CALCIUM 8.9 10/11/2023   PHOS 2.4 (L) 10/11/2023   PROT 7.0 05/11/2021   ALBUMIN 2.5 (L) 10/11/2023   BILITOT 0.7 05/11/2021   ALKPHOS 79 05/11/2021   AST 27 05/11/2021   ALT 32 05/11/2021   ANIONGAP 9 10/11/2023    CBG (last 3)  No results for input(s): "GLUCAP" in the last 72 hours.    Coagulation Profile: No results for input(s): "INR", "PROTIME" in the last 168 hours.   Radiology Studies: I have personally reviewed the imaging studies  CT SHOULDER RIGHT WO CONTRAST Result Date: 10/10/2023 CLINICAL DATA:  Chronic shoulder pain EXAM: CT OF THE UPPER RIGHT EXTREMITY WITHOUT CONTRAST TECHNIQUE: Multidetector CT imaging of the upper right extremity was performed according to the standard protocol. RADIATION DOSE REDUCTION: This exam was performed according to the departmental dose-optimization program which includes automated exposure control, adjustment of the mA and/or kV according to patient size and/or use of iterative reconstruction technique. COMPARISON:  X-ray 10/09/2023 FINDINGS: Bones/Joint/Cartilage No acute fracture. No dislocation. Severe glenohumeral joint osteoarthritis manifested by joint space narrowing, subchondral sclerosis/cystic change, and marginal osteophyte formation. Mild superior glenoid retroversion. A moderate sized glenohumeral joint effusion is evident. 1.4 x 0.9 cm ossified loose body in the subscapularis joint recess. Smaller intra-articular loose bodies at the posterior joint line of the glenohumeral joint. Mild-moderate degenerative changes of the acromioclavicular joint. No large subacromial-subdeltoid bursal fluid collection. Remaining visualized osseous structures appear grossly intact. Ligaments Suboptimally assessed by CT. Muscles and Tendons Preserved muscle bulk of the rotator cuff  musculature without significant atrophy or  fatty infiltration. Rotator cuff tendons appear grossly intact within the limitations of CT. Soft tissues No soft tissue fluid collection or hematoma. No axillary lymphadenopathy. Severe emphysematous changes within the included right lung field. IMPRESSION: 1. Severe glenohumeral joint osteoarthritis. Moderate glenohumeral joint effusion containing several intra-articular loose bodies. 2. Mild-moderate degenerative changes of the acromioclavicular joint. 3. Severe emphysematous changes within the included right lung field (ICD10-J43.9). Electronically Signed   By: Duanne Guess D.O.   On: 10/10/2023 17:48   DG Shoulder Right Result Date: 10/09/2023 CLINICAL DATA:  144615 Pain 144615 EXAM: RIGHT SHOULDER - 2+ VIEW COMPARISON:  None Available. FINDINGS: There is no evidence of fracture or dislocation. Severe degenerative changes of the right shoulder. Soft tissues are unremarkable. Ventriculoperitoneal shunt partially visualized. Lines overlie the chest. IMPRESSION: No acute displaced fracture or dislocation. Electronically Signed   By: Tish Frederickson M.D.   On: 10/09/2023 19:21       Raekwon Winkowski M.D. [facility] 10/11/2023, 11:42 AM  Available via Epic secure chat 7am-7pm After 7 pm, please refer to night coverage provider listed on amion.

## 2023-10-11 NOTE — Plan of Care (Signed)
 Pt is A+O X 3. Resting well started on antibiotic today for possible cellulitis of the rt arm.   No C/O pain just discomfort to arm. No adverse reaction noted. Call light within reach patient family at bedside. Safety maintained.  Problem: Education: Goal: Knowledge of General Education information will improve Description: Including pain rating scale, medication(s)/side effects and non-pharmacologic comfort measures Outcome: Progressing   Problem: Health Behavior/Discharge Planning: Goal: Ability to manage health-related needs will improve Outcome: Progressing   Problem: Clinical Measurements: Goal: Ability to maintain clinical measurements within normal limits will improve Outcome: Progressing Goal: Will remain free from infection Outcome: Progressing Goal: Diagnostic test results will improve Outcome: Progressing Goal: Respiratory complications will improve Outcome: Progressing Goal: Cardiovascular complication will be avoided Outcome: Progressing   Problem: Activity: Goal: Risk for activity intolerance will decrease Outcome: Progressing   Problem: Nutrition: Goal: Adequate nutrition will be maintained Outcome: Progressing   Problem: Coping: Goal: Level of anxiety will decrease Outcome: Progressing   Problem: Elimination: Goal: Will not experience complications related to bowel motility Outcome: Progressing Goal: Will not experience complications related to urinary retention Outcome: Progressing   Problem: Pain Managment: Goal: General experience of comfort will improve and/or be controlled Outcome: Progressing   Problem: Safety: Goal: Ability to remain free from injury will improve Outcome: Progressing   Problem: Skin Integrity: Goal: Risk for impaired skin integrity will decrease Outcome: Progressing   Problem: Clinical Measurements: Goal: Ability to avoid or minimize complications of infection will improve Outcome: Progressing   Problem: Skin  Integrity: Goal: Skin integrity will improve Outcome: Progressing

## 2023-10-11 NOTE — Plan of Care (Signed)

## 2023-10-12 ENCOUNTER — Inpatient Hospital Stay (HOSPITAL_COMMUNITY)

## 2023-10-12 DIAGNOSIS — G912 (Idiopathic) normal pressure hydrocephalus: Secondary | ICD-10-CM | POA: Diagnosis not present

## 2023-10-12 DIAGNOSIS — F419 Anxiety disorder, unspecified: Secondary | ICD-10-CM | POA: Diagnosis not present

## 2023-10-12 DIAGNOSIS — L03113 Cellulitis of right upper limb: Secondary | ICD-10-CM | POA: Diagnosis not present

## 2023-10-12 DIAGNOSIS — R509 Fever, unspecified: Secondary | ICD-10-CM

## 2023-10-12 DIAGNOSIS — I69398 Other sequelae of cerebral infarction: Secondary | ICD-10-CM | POA: Diagnosis not present

## 2023-10-12 DIAGNOSIS — M25411 Effusion, right shoulder: Secondary | ICD-10-CM | POA: Diagnosis not present

## 2023-10-12 DIAGNOSIS — I10 Essential (primary) hypertension: Secondary | ICD-10-CM | POA: Diagnosis not present

## 2023-10-12 DIAGNOSIS — M25511 Pain in right shoulder: Secondary | ICD-10-CM | POA: Diagnosis present

## 2023-10-12 DIAGNOSIS — L8990 Pressure ulcer of unspecified site, unspecified stage: Secondary | ICD-10-CM | POA: Insufficient documentation

## 2023-10-12 DIAGNOSIS — R531 Weakness: Secondary | ICD-10-CM | POA: Diagnosis not present

## 2023-10-12 LAB — SYNOVIAL CELL COUNT + DIFF, W/ CRYSTALS
Eosinophils-Synovial: 0 % (ref 0–1)
Lymphocytes-Synovial Fld: 19 % (ref 0–20)
Monocyte-Macrophage-Synovial Fluid: 31 % — ABNORMAL LOW (ref 50–90)
Neutrophil, Synovial: 50 % — ABNORMAL HIGH (ref 0–25)
WBC, Synovial: 1800 /mm3 — ABNORMAL HIGH (ref 0–200)

## 2023-10-12 LAB — RENAL FUNCTION PANEL
Albumin: 2.3 g/dL — ABNORMAL LOW (ref 3.5–5.0)
Anion gap: 9 (ref 5–15)
BUN: 22 mg/dL (ref 8–23)
CO2: 24 mmol/L (ref 22–32)
Calcium: 8.9 mg/dL (ref 8.9–10.3)
Chloride: 103 mmol/L (ref 98–111)
Creatinine, Ser: 1.16 mg/dL (ref 0.61–1.24)
GFR, Estimated: >60 mL/min (ref >60)
Glucose, Bld: 144 mg/dL — ABNORMAL HIGH (ref 70–99)
Phosphorus: 2.2 mg/dL — ABNORMAL LOW (ref 2.5–4.6)
Potassium: 4.1 mmol/L (ref 3.5–5.1)
Sodium: 136 mmol/L (ref 135–145)

## 2023-10-12 LAB — CBC
HCT: 33.8 % — ABNORMAL LOW (ref 39.0–52.0)
Hemoglobin: 11.7 g/dL — ABNORMAL LOW (ref 13.0–17.0)
MCH: 30.2 pg (ref 26.0–34.0)
MCHC: 34.6 g/dL (ref 30.0–36.0)
MCV: 87.3 fL (ref 80.0–100.0)
Platelets: 118 10*3/uL — ABNORMAL LOW (ref 150–400)
RBC: 3.87 MIL/uL — ABNORMAL LOW (ref 4.22–5.81)
RDW: 12.6 % (ref 11.5–15.5)
WBC: 10.2 10*3/uL (ref 4.0–10.5)
nRBC: 0 % (ref 0.0–0.2)

## 2023-10-12 MED ORDER — LIDOCAINE HCL (PF) 1 % IJ SOLN
5.0000 mL | Freq: Once | INTRAMUSCULAR | Status: AC
  Administered 2023-10-12: 2 mL via SUBCUTANEOUS

## 2023-10-12 NOTE — Progress Notes (Signed)
 Inpatient Rehab Admissions Coordinator:   Continue to follow for potential CIR admit.  ID consult pending.   Estill Dooms, PT, DPT Admissions Coordinator [phone] 10/12/23  12:57 PM
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
## 2023-10-12 NOTE — Procedures (Signed)
 Interventional Radiology Procedure Note  Risks and benefits of joint aspiration were discussed with the patient including, but not limited to bleeding, infection, unable to obtain fluid, and damage to adjacent structures.  All of the patient's questions were answered, patient is agreeable to proceed. Consent signed and in chart.  A timeout was performed with all members of the team prior to start of the procedure. Correct patient and correct procedure was confirmed. Allergies were reviewed.   PROCEDURE SUMMARY:  Successful fluoro guided right shoulder aspiration yielding 1 mL of cloudy yellow fluid. Sample was capped and sent to lab. No immediate complications.  Pt tolerated well.   EBL = none  Please see full dictation in imaging section of Epic for procedure details.

## 2023-10-12 NOTE — TOC Progression Note (Signed)
 Transition of Care [facility]) - Progression Note    Patient Details  Name: Juan Brewer MRN: [id number] Date of Birth: 04/05/1946  [facility]) CM/SW Contact  Kermit Balo, RN Phone Number: 10/12/2023, 1:19 PM  Clinical Narrative:     Plan is for CIR pending shoulder aspiration work up. TOC following.  Expected Discharge Plan: IP Rehab Facility Barriers to Discharge: Continued Medical Work up  Expected Discharge Plan and Services   Discharge Planning Services: CM Consult Post Acute Care Choice: IP Rehab Living arrangements for the past 2 months: Single Family Home Expected Discharge Date: 10/11/23                                     Social Determinants of Health (SDOH) Interventions SDOH Screenings   Food Insecurity: No Food Insecurity (10/06/2023)  Housing: Low Risk  (10/06/2023)  Transportation Needs: No Transportation Needs (10/06/2023)  Utilities: Not At Risk (10/06/2023)  Social Connections: Socially Integrated (10/07/2023)  Tobacco Use: High Risk (10/06/2023)    Readmission Risk Interventions     No data to display

## 2023-10-12 NOTE — Progress Notes (Signed)
 Daughter at bedside. Updated by this RN on patients progress, labs, and scans.

## 2023-10-12 NOTE — Progress Notes (Addendum)
 [facility]                                                                              Juan Brewer, is a 78 y.o. male, DOB - 20-May-1946, WJX:[id number] Admit date - 10/06/2023    Outpatient Primary MD for the patient is [facility]  LOS - 4  days  No chief complaint on file.      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 At his baseline, the patient gets around with a walker and is able to perform his ADLs.  For the past week, the patient has been too weak to get up or ambulate on his own, has had increased confusion, and difficulty with ADLs.  His weakness has been more pronounced on the right side.  [facility] Course: Patient was admitted to [facility] on 10/04/2023.  Neurology was consulted and he underwent evaluation that included: Stroke swallow evaluation was passed.  CT head with no acute findings, ventriculostomy present and ventricular size normal.  CTA head and neck with no emergent large vessel occlusion or hemodynamically significant stenosis.  Echocardiogram with normal EF, no thrombus, and no PFO.  Hemoglobin A1c of 6%.  Total cholesterol 110, LDL 55, HDL 30.  He was seen by PT and OT and recommended for inpatient rehabilitation.   Neurology recommended MRI of the brain without contrast but this could not be performed at [facility] due to the presence of the patient's programmable VP shunt.  Dr. Lovell Sheehan of neurosurgery was contacted and agreed with sending the patient to [facility] for MRI brain.   Assessment & Plan    Principal Problem: Acute encephalopathy, Weakness with confusion History of prior CVA - Was seen by neurology at [facility], had normal TSH, elevated B12, no  acute findings on head CT or CTA head & neck, no thrombus or PFO on echo, and no improvement with treatment of suspected cystitis.  MRI could not be completed at [facility] d/t VP shunt.  -MRI brain completed, no acute intracranial abnormality. -VP shunt was reprogrammed by neurosurgery after the MRI. -Rest of the stroke workup was already done at [facility] including CTA head and neck, 2D echo. -LDL 55, A1c 6 -Continue Plavix 75 mg daily, simvastatin 40 mg daily -PT evaluation recommended CIR  Active problems Right shoulder pain, acute -X-ray showed no acute displaced fracture or dislocation -Right shoulder CT showed severe glenohumeral joint osteoarthritis, moderate glenohumeral joint effusion containing several intra-articular loose bodies, mild to moderate degenerative changes of the acromioclavicular joint -Orthopedics consulted, seen by Dr. Carola Frost, recommended IR joint aspiration and ESI -Elevated ESR, CRP.  Procalcitonin mildly elevated, low-grade fevers, requested IR for joint aspiration.  They will hold off ESI due to fevers -Started on IV Ancef on 3/16, follow blood cultures, requested ID consult  Right hand/forearm cellulitis -placed on IV Ancef, still spiking temp, requested  ID consult -Obtain venous Dopplers RUE to rule out any SVT/DVT     Hx of NPH   - S/p VP shunt placed in May 2024  - Normal ventricular size noted on CT from 10/04/23   -VP shunt reprogrammed by neurosurgery after the MRI   Urinary tract infection - Treated with IV Rocephin at [facility], culture grew mixed flora  -Repeat UA negative for UTI  Cough/bronchitis/COPD exacerbation -Flu, COVID, RSV negative. -Chest x-ray had shown lobular masslike densities in the left upper lobe, discussed in detail with patient's daughter.  Patient has had several CT scans of the chest, had undergone CT-guided biopsy on 06/29/2023 which was negative for malignancy.  Per daughter, patient's PCP, CT surgery,  Dr. Dorris Fetch are on " top of everything", declined any further imaging during this admission. -Continue Tessalon Perles, Robitussin, Breo Ellipta, Incruse Ellipta, scheduled DuoNebs. -Wheezing resolved   Hypertension  -Continue atenolol    Anxiety, insomnia  -Continue triazolam, Xanax -Continue melatonin as needed   Tobacco abuse  - Smoking 1 ppd, trying to cut back     Pressure injury documentation Right, left mid stage I buttocks, POA  Estimated body mass index is 23.43 kg/m as calculated from the following:   Height as of this encounter: 5\' 11"  (1.803 m).   Weight as of 07/14/23: 76.2 kg.  Code Status: DNR DVT Prophylaxis:  enoxaparin (LOVENOX) injection 40 mg Start: 10/06/23 2200   Level of Care: Level of care: Telemetry Medical Family Communication: Updated patient's wife on the phone yesterday Disposition Plan:      Remains inpatient appropriate:   Procedures:  MRI brain  Consultants:   Neurology Neurosurgery  Antimicrobials:   Anti-infectives (From admission, onward)    Start     Dose/Rate Route Frequency Ordered Stop   10/11/23 1400  ceFAZolin (ANCEF) IVPB 2g/100 mL premix        2 g 200 mL/hr over 30 Minutes Intravenous Every 8 hours 10/11/23 0955 10/18/23 1359   10/08/23 2000  cefUROXime (CEFTIN) tablet 500 mg  Status:  Discontinued       Placed in "Followed by" Linked Group   500 mg Oral 2 times daily with meals 10/08/23 1011 10/11/23 0955   10/08/23 1100  cefUROXime (CEFTIN) tablet 500 mg       Placed in "Followed by" Linked Group   500 mg Oral NOW 10/08/23 1011 10/08/23 1134   10/06/23 2215  cephALEXin (KEFLEX) capsule 500 mg  Status:  Discontinued        500 mg Oral Every 12 hours 10/06/23 2117 10/08/23 1011   10/06/23 2200  cefTRIAXone (ROCEPHIN) 1 g in sodium chloride 0.9 % 100 mL IVPB  Status:  Discontinued        1 g 200 mL/hr over 30 Minutes Intravenous Every 24 hours 10/06/23 2058 10/06/23 2117          Medications  atenolol  25  mg Oral Daily   benzonatate  200 mg Oral TID   clopidogrel  75 mg Oral Daily   enoxaparin (LOVENOX) injection  40 mg Subcutaneous Q24H   fluticasone furoate-vilanterol  1 puff Inhalation Daily   And   umeclidinium bromide  1 puff Inhalation Daily   melatonin  3 mg Oral QHS   nicotine  21 mg Transdermal Daily   polyethylene glycol  17 g Oral Daily   simvastatin  40 mg Oral QHS   traZODone  50 mg Oral QHS   triazolam  0.25 mg Oral  QHS      Subjective:   [name] was seen and examined today.  Overnight spiking low-grade fevers, 100.6 F this morning.  Right hand/wrist redness, swelling improving.  Pain in the right shoulder.  No chest pain, nausea vomiting abdominal pain, diarrhea.  Objective:   Vitals:   10/12/23 0520 10/12/23 0746 10/12/23 0811 10/12/23 1154  BP: 115/70 136/80  131/82  Pulse: 70 71  72  Resp: 18 18    Temp: (!) 100.6 F (38.1 C) 98.1 F (36.7 C)  98.5 F (36.9 C)  TempSrc: Oral Oral  Oral  SpO2: 90% (!) 89% (!) 88% 92%  Height:        Intake/Output Summary (Last 24 hours) at 10/12/2023 1206 Last data filed at 10/12/2023 0547 Gross per 24 hour  Intake 384.5 ml  Output 1700 ml  Net -1315.5 ml      Wt Readings from Last 3 Encounters:  07/14/23 76.2 kg  06/29/23 76.2 kg  05/13/23 76.2 kg   Physical Exam General: Alert and oriented x 3, NAD Cardiovascular: S1 S2 clear, RRR.  Respiratory: CTAB, no wheezing Gastrointestinal: Soft, nontender, nondistended, NBS Ext: no pedal edema bilaterally, right shoulder pain, decreased ROM, right forearm/hand cellulitis improving Neuro: no new deficits Psych: Normal affect    Data Reviewed:  I have personally reviewed following labs    CBC Lab Results  Component Value Date   WBC 10.2 10/12/2023   RBC 3.87 (L) 10/12/2023   HGB 11.7 (L) 10/12/2023   HCT 33.8 (L) 10/12/2023   MCV 87.3 10/12/2023   MCH 30.2 10/12/2023   PLT 118 (L) 10/12/2023   MCHC 34.6 10/12/2023   RDW 12.6 10/12/2023    LYMPHSABS 1.2 05/11/2021   MONOABS 1.2 (H) 05/11/2021   EOSABS 0.1 05/11/2021   BASOSABS 0.0 05/11/2021     Last metabolic panel Lab Results  Component Value Date   NA 136 10/12/2023   K 4.1 10/12/2023   CL 103 10/12/2023   CO2 24 10/12/2023   BUN 22 10/12/2023   CREATININE 1.16 10/12/2023   GLUCOSE 144 (H) 10/12/2023   GFRNONAA >60 10/12/2023   CALCIUM 8.9 10/12/2023   PHOS 2.2 (L) 10/12/2023   PROT 7.0 05/11/2021   ALBUMIN 2.3 (L) 10/12/2023   BILITOT 0.7 05/11/2021   ALKPHOS 79 05/11/2021   AST 27 05/11/2021   ALT 32 05/11/2021   ANIONGAP 9 10/12/2023    CBG (last 3)  No results for input(s): "GLUCAP" in the last 72 hours.    Coagulation Profile: No results for input(s): "INR", "PROTIME" in the last 168 hours.   Radiology Studies: I have personally reviewed the imaging studies  CT SHOULDER RIGHT WO CONTRAST Result Date: 10/10/2023 CLINICAL DATA:  Chronic shoulder pain EXAM: CT OF THE UPPER RIGHT EXTREMITY WITHOUT CONTRAST TECHNIQUE: Multidetector CT imaging of the upper right extremity was performed according to the standard protocol. RADIATION DOSE REDUCTION: This exam was performed according to the departmental dose-optimization program which includes automated exposure control, adjustment of the mA and/or kV according to patient size and/or use of iterative reconstruction technique. COMPARISON:  X-ray 10/09/2023 FINDINGS: Bones/Joint/Cartilage No acute fracture. No dislocation. Severe glenohumeral joint osteoarthritis manifested by joint space narrowing, subchondral sclerosis/cystic change, and marginal osteophyte formation. Mild superior glenoid retroversion. A moderate sized glenohumeral joint effusion is evident. 1.4 x 0.9 cm ossified loose body in the subscapularis joint recess. Smaller intra-articular loose bodies at the posterior joint line of the glenohumeral joint. Mild-moderate degenerative changes of the  acromioclavicular joint. No large subacromial-subdeltoid  bursal fluid collection. Remaining visualized osseous structures appear grossly intact. Ligaments Suboptimally assessed by CT. Muscles and Tendons Preserved muscle bulk of the rotator cuff musculature without significant atrophy or fatty infiltration. Rotator cuff tendons appear grossly intact within the limitations of CT. Soft tissues No soft tissue fluid collection or hematoma. No axillary lymphadenopathy. Severe emphysematous changes within the included right lung field. IMPRESSION: 1. Severe glenohumeral joint osteoarthritis. Moderate glenohumeral joint effusion containing several intra-articular loose bodies. 2. Mild-moderate degenerative changes of the acromioclavicular joint. 3. Severe emphysematous changes within the included right lung field (ICD10-J43.9). Electronically Signed   By: Duanne Guess D.O.   On: 10/10/2023 17:48       Moritz Lever M.D. [facility] 10/12/2023, 12:06 PM  Available via Epic secure chat 7am-7pm After 7 pm, please refer to night coverage provider listed on amion.

## 2023-10-12 NOTE — Consult Note (Signed)
 [facility]    Date of Admission:  10/06/2023      Total days of antibiotics 7  Cefazolin 3/16 >> c   Cefphalexin BID 3/11 >> 3/13  Cefpodoxime 3/13 >> 3/16       Reason for Consult: Fever    Referring Provider: Isidoro Donning Primary Care Provider: [facility]   Assessment: Juan Brewer is a 78 y.o. 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 During hospital stay he developed mild leukocytosis and fever 3/15 in the setting of new onset right shoulder pain and wrist pain and redness/edema of the hand and forearm. ID was asked to consult.   Fever -  Unclear if this is reflection of cellulitis/phlebitis from PIV in R arm. 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 Low suspicion this is due to septic arthritis--aspiration with cell count will help differentiate.  He has also had evaluation for lung malignancy with surgical pathology unable to rule out necrotic tumor from biopsy Dec 2024. Given overall frailty, no further work up was recommended outside of monitoring with serial CT scans to assess progression.  -continue cefazolin for now -follow up blood cultures (ordered)   Right Glenohumeral Joint Effusion, Moderate -  Unclear if this is infected or reactive. He does not recall any falls on the shoulder to explain the pain and seemed to spontaneously start hurting as he was being eval'ed for CIR. Will check blood cultures (though he has been on ABX for UTI orally).  -If not much fluid on aspiration - would prioritize cell count > gram stain given he has been on antibiotics >1 week.  -FU pending micro, would not expand coverage at this point until we can get a better idea if this is infected or reactive with severe OA.   Right FA Cellulitis -  He had Rt AC PIV placed at outside hospital and removed 3/14 per patient request - I wonder  if he had some phlebitis here leading to his request to have it removed. No specific other details documented. No cording noted. On exam he has generalized swelling of hand / forearm and mostly ecchymosis but not much erythema noted today on abx. No pre-abx photo for review.  -Cefazolin for coverage   Weakness, Confusion S/P CVA -  Brain MRI w/o any acute finding to explain. Nothing concerning re: shunt catheter.   Disposition -  Would not oppose CIR admission now - he has no hemodynamic instability, no confusion, feels well enough to participate in rehab (though after steroid injection may be more able), Defer to primary team / ortho for further timeline on transfer. We are able to follow during CIR stay as well.     Plan: Blood cultures now Please send fluid from shoulder for cell count #1 and gram stain / culture #2.  Continue cefazolin for phlebitis/cellulitis       Principal Problem:   Right sided weakness Active Problems:   Essential hypertension   (Idiopathic) normal pressure hydrocephalus (HCC)   COPD (chronic obstructive pulmonary disease) (HCC)   History of CVA (cerebrovascular accident)   UTI (urinary tract infection)   Anxiety   Insomnia    atenolol  25 mg Oral Daily   benzonatate  200 mg Oral TID   clopidogrel  75 mg Oral Daily   enoxaparin (LOVENOX) injection  40 mg  Subcutaneous Q24H   fluticasone furoate-vilanterol  1 puff Inhalation Daily   And   umeclidinium bromide  1 puff Inhalation Daily   melatonin  3 mg Oral QHS   nicotine  21 mg Transdermal Daily   polyethylene glycol  17 g Oral Daily   simvastatin  40 mg Oral QHS   traZODone  50 mg Oral QHS   triazolam  0.25 mg Oral QHS    HPI: Juan Brewer is a 78 y.o. male admitted from home with right sided weakness.   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   Admitted from [facility] 3/9 with increasing weakness and confusion. Has  been too weak ton ambulate or get up on his own.  Admitted to [facility] - neurology consulted and recommended MRI brain but unable to perform there with shunt in place. TTE done with normal EF, no thrombus and no PFO. Recommended IP rehab. Transferred to [facility] for MRI of brain with existing VP shunt.  Was treated for UTI with abnormal U/A, mixed flora culture (contaminated) and arrived to [facility] on cephalexin 500 mg BID. This was switched to cefuroxime 500 mg BID 3/13. He was scheduled to discharge to CIR yesterday however this was put on hold d/t new onset right shoulder pain and cellulitis of the right forearm and hand. CT scan with moderate glenohumeral joint effusion noted. Orthopedics has seen (Dr. Carola Frost) - most pain is localized to the wrist. Planning aspiration and steroid injection today. Developed mild leukocytosis 3/15 and fever 100.6 F on 3/17 early morning. Started on cefazolin to treat possible cellulitis.   In speaking to [name] he feels he at some point had an IV in the right forearm (charting reflects it was placed 3/11 and pulled 3/14    Review of Systems: Review of Systems  Constitutional:  Positive for fever and malaise/fatigue. Negative for chills and diaphoresis.  Respiratory:  Positive for cough.   Cardiovascular: Negative.   Gastrointestinal: Negative.   Genitourinary: Negative.   Musculoskeletal:  Positive for joint pain (right shoulder, wright wrist).  Skin:  Positive for rash.     Past Medical History:  Diagnosis Date   Arthritis    back   Hypertension    Stroke (cerebrum) (HCC)     Social History   Tobacco Use   Smoking status: Every Day    Current packs/day: 1.00    Types: Cigarettes   Smokeless tobacco: Never  Vaping Use   Vaping status: Never Used  Substance Use Topics   Alcohol use: Never   Drug use: Never    Family History  Problem Relation Age of Onset   Dementia Father    Parkinson's disease Father    Healthy Brother     Stroke Neg Hx    Neuropathy Neg Hx    No Known Allergies  OBJECTIVE: Blood pressure 131/82, pulse 72, temperature 98.5 F (36.9 C), temperature source Oral, resp. rate 18, height 5\' 11"  (1.803 m), SpO2 92%.  Physical Exam Constitutional:      Appearance: He is ill-appearing.  Neck:     Comments: Favors right side, can turn to left but not comfortably.  Cardiovascular:     Rate and Rhythm: Normal rate and regular rhythm.  Pulmonary:     Effort: Pulmonary effort is normal. No respiratory distress.  Musculoskeletal:        General: Tenderness (right shoulder. right  wrist difficult to supinate) present.  Skin:    Findings: Bruising and erythema present.  Neurological:     Mental Status: He is alert.        Lab Results Lab Results  Component Value Date   WBC 10.2 10/12/2023   HGB 11.7 (L) 10/12/2023   HCT 33.8 (L) 10/12/2023   MCV 87.3 10/12/2023   PLT 118 (L) 10/12/2023    Lab Results  Component Value Date   CREATININE 1.16 10/12/2023   BUN 22 10/12/2023   NA 136 10/12/2023   K 4.1 10/12/2023   CL 103 10/12/2023   CO2 24 10/12/2023    Lab Results  Component Value Date   ALT 32 05/11/2021   AST 27 05/11/2021   ALKPHOS 79 05/11/2021   BILITOT 0.7 05/11/2021     Microbiology: Recent Results (from the past 240 hours)  Resp panel by RT-PCR (RSV, Flu A&B, Covid) Anterior Nasal Swab     Status: None   Collection Time: 10/07/23  3:29 PM   Specimen: Anterior Nasal Swab  Result Value Ref Range Status   SARS Coronavirus 2 by RT PCR NEGATIVE NEGATIVE Final   Influenza A by PCR NEGATIVE NEGATIVE Final   Influenza B by PCR NEGATIVE NEGATIVE Final    Comment: (NOTE) The Xpert Xpress SARS-CoV-2/FLU/RSV plus assay is intended as an aid in the diagnosis of influenza from Nasopharyngeal swab specimens and should not be used as a sole basis for treatment. Nasal washings and aspirates are unacceptable for Xpert Xpress SARS-CoV-2/FLU/RSV testing.  Fact Sheet for  Patients: BloggerCourse.com  Fact Sheet for Healthcare Providers: SeriousBroker.it  This test is not yet approved or cleared by the [place] FDA and has been authorized for detection and/or diagnosis of SARS-CoV-2 by FDA under an Emergency Use Authorization (EUA). This EUA will remain in effect (meaning this test can be used) for the duration of the COVID-19 declaration under Section 564(b)(1) of the Act, 21 U.S.C. section 360bbb-3(b)(1), unless the authorization is terminated or revoked.     Resp Syncytial Virus by PCR NEGATIVE NEGATIVE Final    Comment: (NOTE) Fact Sheet for Patients: BloggerCourse.com  Fact Sheet for Healthcare Providers: SeriousBroker.it  This test is not yet approved or cleared by the [place] FDA and has been authorized for detection and/or diagnosis of SARS-CoV-2 by FDA under an Emergency Use Authorization (EUA). This EUA will remain in effect (meaning this test can be used) for the duration of the COVID-19 declaration under Section 564(b)(1) of the Act, 21 U.S.C. section 360bbb-3(b)(1), unless the authorization is terminated or revoked.  Performed at [facility], [address] [place], [place] [address]     Rexene Alberts, MSN, NP-C [facility] Pager: [phone]  10/12/2023 12:13 PM

## 2023-10-13 ENCOUNTER — Inpatient Hospital Stay (HOSPITAL_COMMUNITY)

## 2023-10-13 ENCOUNTER — Encounter (HOSPITAL_COMMUNITY): Payer: Self-pay | Admitting: Physical Medicine and Rehabilitation

## 2023-10-13 ENCOUNTER — Inpatient Hospital Stay (HOSPITAL_COMMUNITY)
Admission: AD | Admit: 2023-10-13 | Discharge: 2023-10-27 | DRG: 945 | Disposition: A | Discharge status: Home / Self Care | Source: Intra-hospital | Attending: Physical Medicine and Rehabilitation | Admitting: Physical Medicine and Rehabilitation

## 2023-10-13 ENCOUNTER — Other Ambulatory Visit: Payer: Self-pay

## 2023-10-13 DIAGNOSIS — N390 Urinary tract infection, site not specified: Secondary | ICD-10-CM | POA: Diagnosis present

## 2023-10-13 DIAGNOSIS — M25411 Effusion, right shoulder: Secondary | ICD-10-CM | POA: Diagnosis present

## 2023-10-13 DIAGNOSIS — M11211 Other chondrocalcinosis, right shoulder: Secondary | ICD-10-CM | POA: Diagnosis present

## 2023-10-13 DIAGNOSIS — G912 (Idiopathic) normal pressure hydrocephalus: Secondary | ICD-10-CM | POA: Diagnosis not present

## 2023-10-13 DIAGNOSIS — M7989 Other specified soft tissue disorders: Secondary | ICD-10-CM | POA: Diagnosis not present

## 2023-10-13 DIAGNOSIS — Z7951 Long term (current) use of inhaled steroids: Secondary | ICD-10-CM

## 2023-10-13 DIAGNOSIS — Z7409 Other reduced mobility: Secondary | ICD-10-CM | POA: Diagnosis present

## 2023-10-13 DIAGNOSIS — G4700 Insomnia, unspecified: Secondary | ICD-10-CM | POA: Diagnosis present

## 2023-10-13 DIAGNOSIS — L03113 Cellulitis of right upper limb: Secondary | ICD-10-CM

## 2023-10-13 DIAGNOSIS — G9349 Other encephalopathy: Secondary | ICD-10-CM | POA: Diagnosis present

## 2023-10-13 DIAGNOSIS — K5900 Constipation, unspecified: Secondary | ICD-10-CM | POA: Diagnosis not present

## 2023-10-13 DIAGNOSIS — R5381 Other malaise: Secondary | ICD-10-CM | POA: Diagnosis present

## 2023-10-13 DIAGNOSIS — R001 Bradycardia, unspecified: Secondary | ICD-10-CM | POA: Diagnosis present

## 2023-10-13 DIAGNOSIS — F17210 Nicotine dependence, cigarettes, uncomplicated: Secondary | ICD-10-CM | POA: Diagnosis present

## 2023-10-13 DIAGNOSIS — F419 Anxiety disorder, unspecified: Secondary | ICD-10-CM | POA: Diagnosis present

## 2023-10-13 DIAGNOSIS — G479 Sleep disorder, unspecified: Secondary | ICD-10-CM | POA: Diagnosis not present

## 2023-10-13 DIAGNOSIS — Z982 Presence of cerebrospinal fluid drainage device: Secondary | ICD-10-CM | POA: Diagnosis not present

## 2023-10-13 DIAGNOSIS — N179 Acute kidney failure, unspecified: Secondary | ICD-10-CM | POA: Diagnosis present

## 2023-10-13 DIAGNOSIS — G9340 Encephalopathy, unspecified: Principal | ICD-10-CM | POA: Diagnosis present

## 2023-10-13 DIAGNOSIS — Z820 Family history of epilepsy and other diseases of the nervous system: Secondary | ICD-10-CM

## 2023-10-13 DIAGNOSIS — R531 Weakness: Secondary | ICD-10-CM | POA: Diagnosis not present

## 2023-10-13 DIAGNOSIS — Z79899 Other long term (current) drug therapy: Secondary | ICD-10-CM | POA: Diagnosis not present

## 2023-10-13 DIAGNOSIS — L03115 Cellulitis of right lower limb: Secondary | ICD-10-CM

## 2023-10-13 DIAGNOSIS — D696 Thrombocytopenia, unspecified: Secondary | ICD-10-CM | POA: Diagnosis present

## 2023-10-13 DIAGNOSIS — I69351 Hemiplegia and hemiparesis following cerebral infarction affecting right dominant side: Secondary | ICD-10-CM

## 2023-10-13 DIAGNOSIS — I10 Essential (primary) hypertension: Secondary | ICD-10-CM

## 2023-10-13 DIAGNOSIS — G8929 Other chronic pain: Secondary | ICD-10-CM | POA: Diagnosis present

## 2023-10-13 DIAGNOSIS — M19011 Primary osteoarthritis, right shoulder: Secondary | ICD-10-CM | POA: Diagnosis not present

## 2023-10-13 DIAGNOSIS — J441 Chronic obstructive pulmonary disease with (acute) exacerbation: Secondary | ICD-10-CM | POA: Diagnosis present

## 2023-10-13 DIAGNOSIS — E785 Hyperlipidemia, unspecified: Secondary | ICD-10-CM | POA: Diagnosis present

## 2023-10-13 DIAGNOSIS — Z7902 Long term (current) use of antithrombotics/antiplatelets: Secondary | ICD-10-CM

## 2023-10-13 DIAGNOSIS — M25511 Pain in right shoulder: Secondary | ICD-10-CM

## 2023-10-13 DIAGNOSIS — L89321 Pressure ulcer of left buttock, stage 1: Secondary | ICD-10-CM | POA: Diagnosis present

## 2023-10-13 DIAGNOSIS — I129 Hypertensive chronic kidney disease with stage 1 through stage 4 chronic kidney disease, or unspecified chronic kidney disease: Secondary | ICD-10-CM | POA: Diagnosis present

## 2023-10-13 DIAGNOSIS — M545 Low back pain, unspecified: Secondary | ICD-10-CM | POA: Diagnosis present

## 2023-10-13 DIAGNOSIS — K5901 Slow transit constipation: Secondary | ICD-10-CM | POA: Diagnosis not present

## 2023-10-13 LAB — RENAL FUNCTION PANEL
Albumin: 2.1 g/dL — ABNORMAL LOW (ref 3.5–5.0)
Anion gap: 8 (ref 5–15)
BUN: 23 mg/dL (ref 8–23)
CO2: 22 mmol/L (ref 22–32)
Calcium: 8.6 mg/dL — ABNORMAL LOW (ref 8.9–10.3)
Chloride: 106 mmol/L (ref 98–111)
Creatinine, Ser: 1.24 mg/dL (ref 0.61–1.24)
GFR, Estimated: >60 mL/min (ref >60)
Glucose, Bld: 131 mg/dL — ABNORMAL HIGH (ref 70–99)
Phosphorus: 2.4 mg/dL — ABNORMAL LOW (ref 2.5–4.6)
Potassium: 4.2 mmol/L (ref 3.5–5.1)
Sodium: 136 mmol/L (ref 135–145)

## 2023-10-13 LAB — CBC
HCT: 34.3 % — ABNORMAL LOW (ref 39.0–52.0)
Hemoglobin: 11.5 g/dL — ABNORMAL LOW (ref 13.0–17.0)
MCH: 29.9 pg (ref 26.0–34.0)
MCHC: 33.5 g/dL (ref 30.0–36.0)
MCV: 89.3 fL (ref 80.0–100.0)
Platelets: 143 10*3/uL — ABNORMAL LOW (ref 150–400)
RBC: 3.84 MIL/uL — ABNORMAL LOW (ref 4.22–5.81)
RDW: 12.9 % (ref 11.5–15.5)
WBC: 10.4 10*3/uL (ref 4.0–10.5)
nRBC: 0 % (ref 0.0–0.2)

## 2023-10-13 LAB — BASIC METABOLIC PANEL
Anion gap: 8 (ref 5–15)
BUN: 25 mg/dL — ABNORMAL HIGH (ref 8–23)
CO2: 25 mmol/L (ref 22–32)
Calcium: 9 mg/dL (ref 8.9–10.3)
Chloride: 104 mmol/L (ref 98–111)
Creatinine, Ser: 1.24 mg/dL (ref 0.61–1.24)
GFR, Estimated: >60 mL/min (ref >60)
Glucose, Bld: 147 mg/dL — ABNORMAL HIGH (ref 70–99)
Potassium: 4.2 mmol/L (ref 3.5–5.1)
Sodium: 137 mmol/L (ref 135–145)

## 2023-10-13 LAB — CREATININE, SERUM
Creatinine, Ser: 1.25 mg/dL — ABNORMAL HIGH (ref 0.61–1.24)
GFR, Estimated: >60 mL/min (ref >60)

## 2023-10-13 LAB — GLUCOSE, CAPILLARY: Glucose-Capillary: 147 mg/dL — ABNORMAL HIGH (ref 70–99)

## 2023-10-13 MED ORDER — MELATONIN 3 MG PO TABS
3.0000 mg | ORAL_TABLET | Freq: Every day | ORAL | Status: DC
  Administered 2023-10-13 – 2023-10-26 (×14): 3 mg via ORAL
  Filled 2023-10-13 (×14): qty 1

## 2023-10-13 MED ORDER — ENOXAPARIN SODIUM 40 MG/0.4ML IJ SOSY
40.0000 mg | PREFILLED_SYRINGE | INTRAMUSCULAR | Status: DC
  Administered 2023-10-13 – 2023-10-26 (×14): 40 mg via SUBCUTANEOUS
  Filled 2023-10-13 (×14): qty 0.4

## 2023-10-13 MED ORDER — CEFADROXIL 500 MG PO CAPS
500.0000 mg | ORAL_CAPSULE | Freq: Two times a day (BID) | ORAL | Status: AC
  Administered 2023-10-13 – 2023-10-17 (×9): 500 mg via ORAL
  Filled 2023-10-13 (×9): qty 1

## 2023-10-13 MED ORDER — SENNOSIDES-DOCUSATE SODIUM 8.6-50 MG PO TABS: 1.0000 | ORAL_TABLET | Freq: Every evening | ORAL | Status: DC | PRN

## 2023-10-13 MED ORDER — ONDANSETRON HCL 4 MG PO TABS: 4.0000 mg | ORAL_TABLET | Freq: Four times a day (QID) | ORAL | Status: DC | PRN

## 2023-10-13 MED ORDER — FLEET ENEMA RE ENEM: 1.0000 | ENEMA | Freq: Once | RECTAL | Status: DC | PRN

## 2023-10-13 MED ORDER — CEFADROXIL 500 MG PO CAPS
500.0000 mg | ORAL_CAPSULE | Freq: Two times a day (BID) | ORAL | Status: DC
  Administered 2023-10-13: 500 mg via ORAL
  Filled 2023-10-13 (×3): qty 1

## 2023-10-13 MED ORDER — GUAIFENESIN-DM 100-10 MG/5ML PO SYRP
5.0000 mL | ORAL_SOLUTION | ORAL | Status: DC | PRN
  Administered 2023-10-16: 5 mL via ORAL
  Filled 2023-10-13: qty 10

## 2023-10-13 MED ORDER — NICOTINE 21 MG/24HR TD PT24
21.0000 mg | MEDICATED_PATCH | Freq: Every day | TRANSDERMAL | Status: DC
  Administered 2023-10-14 – 2023-10-15 (×2): 21 mg via TRANSDERMAL
  Filled 2023-10-13 (×2): qty 1

## 2023-10-13 MED ORDER — LORAZEPAM 2 MG/ML IJ SOLN: 1.0000 mg | Freq: Once | INTRAMUSCULAR | Status: DC | PRN

## 2023-10-13 MED ORDER — TRAZODONE HCL 50 MG PO TABS
50.0000 mg | ORAL_TABLET | Freq: Every day | ORAL | Status: DC
  Administered 2023-10-13 – 2023-10-26 (×14): 50 mg via ORAL
  Filled 2023-10-13 (×14): qty 1

## 2023-10-13 MED ORDER — BENZONATATE 100 MG PO CAPS
200.0000 mg | ORAL_CAPSULE | Freq: Three times a day (TID) | ORAL | Status: DC
  Administered 2023-10-13 – 2023-10-27 (×42): 200 mg via ORAL
  Filled 2023-10-13 (×43): qty 2

## 2023-10-13 MED ORDER — CLOPIDOGREL BISULFATE 75 MG PO TABS
75.0000 mg | ORAL_TABLET | Freq: Every day | ORAL | Status: DC
  Administered 2023-10-14 – 2023-10-27 (×14): 75 mg via ORAL
  Filled 2023-10-13 (×14): qty 1

## 2023-10-13 MED ORDER — SIMVASTATIN 20 MG PO TABS
40.0000 mg | ORAL_TABLET | Freq: Every day | ORAL | Status: DC
  Administered 2023-10-13 – 2023-10-26 (×14): 40 mg via ORAL
  Filled 2023-10-13 (×14): qty 2

## 2023-10-13 MED ORDER — FLUTICASONE FUROATE-VILANTEROL 100-25 MCG/ACT IN AEPB
1.0000 | INHALATION_SPRAY | Freq: Every day | RESPIRATORY_TRACT | Status: DC
  Administered 2023-10-14 – 2023-10-27 (×12): 1 via RESPIRATORY_TRACT
  Filled 2023-10-13: qty 28

## 2023-10-13 MED ORDER — ACETAMINOPHEN 325 MG PO TABS
325.0000 mg | ORAL_TABLET | ORAL | Status: DC | PRN
  Administered 2023-10-14 – 2023-10-25 (×8): 650 mg via ORAL
  Filled 2023-10-13 (×8): qty 2

## 2023-10-13 MED ORDER — UMECLIDINIUM BROMIDE 62.5 MCG/ACT IN AEPB
1.0000 | INHALATION_SPRAY | Freq: Every day | RESPIRATORY_TRACT | Status: DC
  Administered 2023-10-14 – 2023-10-27 (×13): 1 via RESPIRATORY_TRACT
  Filled 2023-10-13 (×2): qty 7

## 2023-10-13 MED ORDER — ATENOLOL 25 MG PO TABS
25.0000 mg | ORAL_TABLET | Freq: Every day | ORAL | Status: DC
  Administered 2023-10-13 – 2023-10-14 (×2): 25 mg via ORAL
  Filled 2023-10-13 (×2): qty 1

## 2023-10-13 MED ORDER — IPRATROPIUM-ALBUTEROL 0.5-2.5 (3) MG/3ML IN SOLN: 3.0000 mL | Freq: Four times a day (QID) | RESPIRATORY_TRACT | Status: DC | PRN

## 2023-10-13 MED ORDER — DICLOFENAC SODIUM 1 % EX GEL
2.0000 g | Freq: Three times a day (TID) | CUTANEOUS | Status: DC
  Administered 2023-10-13 – 2023-10-27 (×41): 2 g via TOPICAL
  Filled 2023-10-13: qty 100

## 2023-10-13 MED ORDER — CALCIUM CARBONATE ANTACID 500 MG PO CHEW
1.0000 | CHEWABLE_TABLET | Freq: Two times a day (BID) | ORAL | Status: DC | PRN
  Administered 2023-10-15 – 2023-10-17 (×3): 200 mg via ORAL
  Administered 2023-10-19 – 2023-10-20 (×2): 400 mg via ORAL
  Administered 2023-10-21: 200 mg via ORAL
  Administered 2023-10-21 – 2023-10-22 (×2): 400 mg via ORAL
  Filled 2023-10-13 (×2): qty 1
  Filled 2023-10-13: qty 2
  Filled 2023-10-13: qty 1
  Filled 2023-10-13 (×4): qty 2

## 2023-10-13 MED ORDER — POLYETHYLENE GLYCOL 3350 17 G PO PACK
17.0000 g | PACK | Freq: Every day | ORAL | Status: DC
  Administered 2023-10-14: 17 g via ORAL
  Filled 2023-10-13: qty 1

## 2023-10-13 MED ORDER — ALPRAZOLAM 0.5 MG PO TABS
0.5000 mg | ORAL_TABLET | Freq: Four times a day (QID) | ORAL | Status: DC | PRN
  Administered 2023-10-13 – 2023-10-21 (×13): 0.5 mg via ORAL
  Filled 2023-10-13 (×13): qty 1

## 2023-10-13 MED ORDER — TRIAZOLAM 0.125 MG PO TABS
0.2500 mg | ORAL_TABLET | Freq: Every day | ORAL | Status: DC
  Administered 2023-10-13 – 2023-10-26 (×14): 0.25 mg via ORAL
  Filled 2023-10-13 (×14): qty 2

## 2023-10-13 MED ORDER — ONDANSETRON HCL 4 MG/2ML IJ SOLN: 4.0000 mg | Freq: Four times a day (QID) | INTRAMUSCULAR | Status: DC | PRN
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
## 2023-10-13 NOTE — Progress Notes (Signed)
 Admitted to CIR today. Admission/Assessment complete.    Tilden Dome, LPN

## 2023-10-13 NOTE — PMR Pre-admission (Signed)
 PMR Admission Coordinator Pre-Admission Assessment  Patient: Juan Brewer is an 78 y.o., male MRN: [id number] DOB: 1945/09/29 Height: 5\' 11"  (180.3 cm) Weight:    Insurance Information HMO:     PPO:      PCP:      IPA:      80/20:      OTHER:  PRIMARY: Medicare A/B      Policy#: [id number]       Subscriber: pt CM Name:       Phone#:      Fax#:  Pre-Cert#: verified Health and safety inspector:  Benefits:  Phone #:      Name:  Eff. Date: 12/26/12 A/B     Deduct: $1676      Out of Pocket Max: n/a      Life Max: n/a CIR: 100%      SNF: 20 full days Outpatient: 80%     Co-Pay: 20% Home Health: 100%      Co-Pay:  DME: 80%     Co-Pay: 20% Providers:  SECONDARY: BCBS Supplement      Policy#: [id number]     Phone#: [phone]  Financial Counselor:       Phone#:   The "Data Collection Information Summary" for patients in Inpatient Rehabilitation Facilities with attached "Privacy Act Statement-Health Care Records" was provided and verbally reviewed with: Patient and Family  Emergency Contact Information Contact Information     Name Relation Home Work Mobile   PRICE,TERRI Spouse  [phone]       Other Contacts     Name Relation Home Work Mobile   Price,Will Son   [phone]       Current Medical History  Patient Admitting Diagnosis: encephalopathy  History of Present Illness: Pt is a 78 y/o male with PMH of CVA with residual R sided weakness, HTN, COPD, and 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  At [facility] neurology was consulted and stroke workup was essentially negative, except for MRI which they could not perform due to VPS, so he was transferred to [facility] on 3/11.  Neurology consulted and noted R weakness with slight drift.  Neurosurgery consulted for VPS management and reduced setting to 60 mmHg.  On 3/14 pt developed increase RUE pain, redness.  Pt underwent  xray and CT of R shoulder which revealed severe glenohumeral joint OA with moderate glenohumeral effusion with intra-articular loose bodies and mild/moderate degeneration of the AC joint.  Labs also showing elevated ESR/CRP and in conjunction with low grade fevers ID was consulted.  Recommendations to continue to ancef until blood cultures are back.  IR aspirated GH joint on 3/17.  Therapy evaluations completed and pt was recommended for CIR due to functional decline.    Complete NIHSS TOTAL: 4  Patient's medical record from [facility] (no records available from [facility]) has been reviewed by the rehabilitation admission coordinator and physician.  Past Medical History  Past Medical History:  Diagnosis Date   Arthritis    back   Hypertension    Stroke (cerebrum) (HCC)     Has the patient had major surgery during 100 days prior to admission? Yes  Family History   family history includes Dementia in his father; Healthy in his brother; Parkinson's disease in his father.  Current Medications  Current Facility-Administered Medications:    acetaminophen (TYLENOL) tablet 650 mg, 650 mg, Oral, Q4H PRN,  650 mg at 10/11/23 2117 **OR** acetaminophen (TYLENOL) 160 MG/5ML solution 650 mg, 650 mg, Per Tube, Q4H PRN **OR** acetaminophen (TYLENOL) suppository 650 mg, 650 mg, Rectal, Q4H PRN, Opyd, Lavone Neri, MD   ALPRAZolam Prudy Feeler) tablet 0.5 mg, 0.5 mg, Oral, Q6H PRN, Opyd, Lavone Neri, MD, 0.5 mg at 10/11/23 0911   atenolol (TENORMIN) tablet 25 mg, 25 mg, Oral, Daily, Opyd, Lavone Neri, MD, 25 mg at 10/12/23 1610   benzonatate (TESSALON) capsule 200 mg, 200 mg, Oral, TID, Rai, Ripudeep K, MD, 200 mg at 10/13/23 0841   calcium carbonate (TUMS - dosed in mg elemental calcium) chewable tablet 200-400 mg of elemental calcium, 1-2 tablet, Oral, BID PRN, Rai, Ripudeep K, MD, 200 mg of elemental calcium at 10/11/23 0911   cefadroxil (DURICEF) capsule 500 mg, 500 mg, Oral, BID, Danelle Earthly, MD   clopidogrel  (PLAVIX) tablet 75 mg, 75 mg, Oral, Daily, Opyd, Lavone Neri, MD, 75 mg at 10/13/23 0858   enoxaparin (LOVENOX) injection 40 mg, 40 mg, Subcutaneous, Q24H, Opyd, Lavone Neri, MD, 40 mg at 10/12/23 2208   fluticasone furoate-vilanterol (BREO ELLIPTA) 100-25 MCG/ACT 1 puff, 1 puff, Inhalation, Daily, 1 puff at 10/13/23 0835 **AND** umeclidinium bromide (INCRUSE ELLIPTA) 62.5 MCG/ACT 1 puff, 1 puff, Inhalation, Daily, Opyd, Lavone Neri, MD, 1 puff at 10/13/23 0835   guaiFENesin-dextromethorphan (ROBITUSSIN DM) 100-10 MG/5ML syrup 5 mL, 5 mL, Oral, Q4H PRN, Rai, Ripudeep K, MD   ipratropium-albuterol (DUONEB) 0.5-2.5 (3) MG/3ML nebulizer solution 3 mL, 3 mL, Nebulization, Q6H PRN, Rai, Ripudeep K, MD   LORazepam (ATIVAN) injection 1 mg, 1 mg, Intravenous, Once PRN, Rai, Ripudeep K, MD   melatonin tablet 3 mg, 3 mg, Oral, QHS, Rai, Ripudeep K, MD, 3 mg at 10/12/23 2206   nicotine (NICODERM CQ - dosed in mg/24 hours) patch 21 mg, 21 mg, Transdermal, Daily, Opyd, Lavone Neri, MD, 21 mg at 10/13/23 0850   polyethylene glycol (MIRALAX / GLYCOLAX) packet 17 g, 17 g, Oral, Daily, Rai, Ripudeep K, MD, 17 g at 10/13/23 0841   prochlorperazine (COMPAZINE) injection 5 mg, 5 mg, Intravenous, Q6H PRN, Opyd, Lavone Neri, MD   senna-docusate (Senokot-S) tablet 1 tablet, 1 tablet, Oral, QHS PRN, Opyd, Lavone Neri, MD, 1 tablet at 10/12/23 2250   simvastatin (ZOCOR) tablet 40 mg, 40 mg, Oral, QHS, Opyd, Timothy S, MD, 40 mg at 10/12/23 2206   traZODone (DESYREL) tablet 50 mg, 50 mg, Oral, QHS, Opyd, Timothy S, MD, 50 mg at 10/12/23 2207   triazolam (HALCION) tablet 0.25 mg, 0.25 mg, Oral, QHS, Opyd, Timothy S, MD, 0.25 mg at 10/12/23 2206  Patients Current Diet:  Diet Order             Diet regular Fluid consistency: Thin  Diet effective now                   Precautions / Restrictions Precautions Precautions: Fall Precaution/Restrictions Comments: Fear of Falling Restrictions Weight Bearing Restrictions Per  Provider Order: No   Has the patient had 2 or more falls or a fall with injury in the past year? No  Prior Activity Level Limited Community (1-2x/wk): went out with family to the "office" a few times a week, assist to manage stairs, typically mod I with RW at home, mod I for ADLs, assist for iADLs  Prior Functional Level Self Care: Did the patient need help bathing, dressing, using the toilet or eating? Independent  Indoor Mobility: Did the patient need assistance with walking from room  to room (with or without device)? Independent  Stairs: Did the patient need assistance with internal or external stairs (with or without device)? Needed some help  Functional Cognition: Did the patient need help planning regular tasks such as shopping or remembering to take medications? Needed some help  Patient Information Are you of Hispanic, Latino/a,or Spanish origin?: A. No, not of Hispanic, Latino/a, or Spanish origin What is your race?: A. White Do you need or want an interpreter to communicate with a doctor or health care staff?: 0. No  Patient's Response To:  Health Literacy and Transportation Is the patient able to respond to health literacy and transportation needs?: Yes Health Literacy - How often do you need to have someone help you when you read instructions, pamphlets, or other written material from your doctor or pharmacy?: Never In the past 12 months, has lack of transportation kept you from medical appointments or from getting medications?: No In the past 12 months, has lack of transportation kept you from meetings, work, or from getting things needed for daily living?: No  Journalist, newspaper / Equipment Home Equipment: Grab bars - tub/shower, Information systems manager, Rollator (4 wheels), Cane - single point  Prior Device Use: Indicate devices/aids used by the patient prior to current illness, exacerbation or injury? Walker  Current Functional Level Cognition  Arousal/Alertness:  Awake/alert Overall Cognitive Status: Impaired/Different from baseline Orientation Level: Oriented to person, Oriented to place, Oriented to situation, Disoriented to time Attention: Sustained Sustained Attention: Appears intact Memory: Impaired Memory Impairment: Storage deficit, Retrieval deficit, Decreased recall of new information (scored in moderate impairment on Cognistat (1/4 recalled, needing cues), recalled majority of what neurologist relayed) Awareness: Impaired Awareness Impairment: Intellectual impairment (states physical deficits but not cognitive) Problem Solving: Appears intact (for verbal, may break down in functional situation) Safety/Judgment: Appears intact (may have difficulty in functional situations)    Extremity Assessment (includes Sensation/Coordination)  Upper Extremity Assessment: Right hand dominant, RUE deficits/detail, Overall WFL for tasks assessed (LUE>RUE) RUE Deficits / Details: hx of 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. sensation intact, able to lift about 60* shoulder flexion against gravity. RUE: Shoulder pain with ROM RUE Sensation: WNL RUE Coordination: WNL  Lower Extremity Assessment: Overall WFL for tasks assessed    ADLs  Overall ADL's : Needs assistance/impaired Eating/Feeding: Independent, Sitting Grooming: Sitting, Contact guard assist, Wash/dry face Upper Body Bathing: Sitting, Contact guard assist Lower Body Bathing: Sitting/lateral leans, Minimal assistance Upper Body Dressing : Sitting, Contact guard assist Lower Body Dressing: Sitting/lateral leans, Contact guard assist Lower Body Dressing Details (indicate cue type and reason): don bilat socks, anticiate need for MAx A to Mod A +2 for sts dressing Toilet Transfer: Maximal assistance, +2 for physical assistance, +2 for safety/equipment, Stand-pivot Toileting- Clothing Manipulation and Hygiene: Maximal assistance, +2 for physical assistance, +2 for safety/equipment Functional mobility  during ADLs: +2 for physical assistance, Maximal assistance, +2 for safety/equipment General ADL Comments: Focused session on STS with stedy and promotion of anterior leaning    Mobility  Overal bed mobility: Needs Assistance Bed Mobility: Sit to Supine, Sidelying to Sit, Rolling Rolling: Mod assist, Used rails Sidelying to sit: Max assist, +2 for physical assistance, +2 for safety/equipment, Used rails Supine to sit: Max assist, HOB elevated, Used rails Sit to supine: Max assist General bed mobility comments: cues for sequencing reach for bed rail with Roll Rt/Lt. Mod-max assist as pt fatigued. Max assist +2 fo sdielying to sit EOB. pt ultimately returned to supine after fatigued from attempts  at stadnding.    Transfers  Overall transfer level: Needs assistance Equipment used: Ambulation equipment used Transfers: Sit to/from Stand, Bed to chair/wheelchair/BSC Sit to Stand: Via lift equipment, Max assist, +2 safety/equipment, +2 physical assistance, From elevated surface Bed to/from chair/wheelchair/BSC transfer type:: Via Lift equipment Stand pivot transfers: Mod assist, +2 physical assistance, +2 safety/equipment, From elevated surface Transfer via Lift Equipment: [name] General transfer comment: Attempted sit<>stand to [facility] with max +2 assist 3x from elevated EOB. pt with heavy lean Rt and posteriorly, Max assist needed to maintain upright sitting. Pt with poor endurance to maintain hand placement on Stedy crossbar. Maxisky used for [employer] transfer.    Ambulation / Gait / Stairs / Wheelchair Mobility  Ambulation/Gait General Gait Details: Pt declined.    Posture / Balance Balance Overall balance assessment: Needs assistance Sitting-balance support: Feet supported, Single extremity supported Sitting balance-Leahy Scale: Poor Postural control: Right lateral lean, Posterior lean Standing balance support: Bilateral upper extremity supported, During functional activity,  Reliant on assistive device for balance Standing balance-Leahy Scale: Zero Standing balance comment: Reliant on therapists    Special needs/care consideration Diabetic management A1C 6   Previous Home Environment (from acute therapy documentation) Living Arrangements: Spouse/significant other  Lives With: Spouse Available Help at Discharge: Family, Available PRN/intermittently (wife works 2-3 days per week) Type of Home: House Home Layout: Able to live on main level with bedroom/bathroom, Two level Alternate Level Stairs-Number of Steps: pt reports he does not need to go upstairs Home Access: Stairs to enter Entrance Stairs-Rails: None, Right Entrance Stairs-Number of Steps: 1 Bathroom Shower/Tub: Engineer, manufacturing systems: Handicapped height Home Care Services: Yes Type of Home Care Services: Home PT Additional Comments: Spouse notes that pt has had some falls, pt denied having falls  Discharge Living Setting Plans for Discharge Living Setting: Patient's home, Lives with (comment) (spouse) Type of Home at Discharge: House Discharge Home Layout: Able to live on main level with bedroom/bathroom Discharge Home Access: Stairs to enter Entrance Stairs-Rails: Right Entrance Stairs-Number of Steps: 1 Discharge Bathroom Shower/Tub: Tub/shower unit Discharge Bathroom Toilet: Handicapped height Discharge Bathroom Accessibility: Yes How Accessible: Accessible via walker Does the patient have any problems obtaining your medications?: No  Social/Family/Support Systems Patient Roles: Spouse Anticipated Caregiver: Myrtie Hawk Anticipated Caregiver's Contact Information: [phone] Ability/Limitations of Caregiver: n/a Caregiver Availability: 24/7 Discharge Plan Discussed with Primary Caregiver: Yes Is Caregiver In Agreement with Plan?: Yes Does Caregiver/Family have Issues with Lodging/Transportation while Pt is in Rehab?: No  Goals Patient/Family Goal for Rehab: PT/OT/SLP  supervision to min assist Expected length of stay: 21-24 days Additional Information: Discharge plan: will discharge home with family providing 24/7 assist if needed Pt/Family Agrees to Admission and willing to participate: Yes Program Orientation Provided & Reviewed with Pt/Caregiver Including Roles  & Responsibilities: Yes  Decrease burden of Care through IP rehab admission: n/a  Possible need for SNF placement upon discharge:  Not anticipated.  Plan for discharge home with family to provide 24/7 assist.   Patient Condition: I have reviewed medical records from [facility], spoken with CSW, and patient, spouse, and family member. I met with patient at the bedside for inpatient rehabilitation assessment.  Patient will benefit from ongoing PT, OT, and SLP, can actively participate in 3 hours of therapy a day 5 days of the week, and can make measurable gains during the admission.  Patient will also benefit from the coordinated team approach during an Inpatient Acute Rehabilitation admission.  The patient will receive intensive  therapy as well as Rehabilitation physician, nursing, social worker, and care management interventions.  Due to bladder management, bowel management, safety, skin/wound care, disease management, medication administration, pain management, and patient education the patient requires 24 hour a day rehabilitation nursing.  The patient is currently max assist +2 with mobility and basic ADLs.  Discharge setting and therapy post discharge at home with home health is anticipated.  Patient has agreed to participate in the Acute Inpatient Rehabilitation Program and will admit Saturday 10/10/23.  Preadmission Screen Completed By:  Stephania Fragmin, PT, DPT 10/13/2023 9:21 AM ______________________________________________________________________   Discussed status with Dr. Riley Kill on 10/13/23  at 9:21 AM  and received approval for admission today.  Admission Coordinator:  Stephania Fragmin, PT,  DPT time 9:21 AM Dorna Bloom 10/13/23    Assessment/Plan: Diagnosis: NPH, RUE cellulitis, ?septic arthritis right shoulder Does the need for close, 24 hr/day Medical supervision in concert with the patient's rehab needs make it unreasonable for this patient to be served in a less intensive setting? Yes Co-Morbidities requiring supervision/potential complications: ID and pain considerations Due to bladder management, bowel management, safety, skin/wound care, disease management, medication administration, pain management, and patient education, does the patient require 24 hr/day rehab nursing? Yes Does the patient require coordinated care of a physician, rehab nurse, PT, OT, and SLP to address physical and functional deficits in the context of the above medical diagnosis(es)? Yes Addressing deficits in the following areas: balance, endurance, locomotion, strength, transferring, bowel/bladder control, bathing, dressing, feeding, grooming, toileting, cognition, and psychosocial support Can the patient actively participate in an intensive therapy program of at least 3 hrs of therapy 5 days a week? Yes The potential for patient to make measurable gains while on inpatient rehab is excellent Anticipated functional outcomes upon discharge from inpatient rehab: supervision and min assist PT, supervision and min assist OT, supervision SLP Estimated rehab length of stay to reach the above functional goals is: 21-24 days Anticipated discharge destination: Home 10. Overall Rehab/Functional Prognosis: excellent   MD Signature: Ranelle Oyster, MD, [facility] Physical Medicine & Rehabilitation Medical Director Rehabilitation Services 10/13/2023

## 2023-10-13 NOTE — Plan of Care (Signed)
  Problem: RH BOWEL ELIMINATION Goal: RH STG MANAGE BOWEL WITH ASSISTANCE Description: STG Manage Bowel with toileting Assistance. Outcome: Progressing Goal: RH STG MANAGE BOWEL W/MEDICATION W/ASSISTANCE Description: STG Manage Bowel with Medication with mod I Assistance. Outcome: Progressing   Problem: RH BLADDER ELIMINATION Goal: RH STG MANAGE BLADDER WITH ASSISTANCE Description: STG Manage Bladder With toileting Assistance Outcome: Progressing   Problem: RH SKIN INTEGRITY Goal: RH STG SKIN FREE OF INFECTION/BREAKDOWN Description: Manage skin w min assist Outcome: Progressing Goal: RH STG MAINTAIN SKIN INTEGRITY WITH ASSISTANCE Description: STG Maintain Skin Integrity With min Assistance. Outcome: Progressing   Problem: RH SAFETY Goal: RH STG ADHERE TO SAFETY PRECAUTIONS W/ASSISTANCE/DEVICE Description: STG Adhere to Safety Precautions With cues Assistance/Device. Outcome: Progressing   Problem: RH PAIN MANAGEMENT Goal: RH STG PAIN MANAGED AT OR BELOW PT'S PAIN GOAL Description: < 4 with prns Outcome: Progressing   Problem: RH KNOWLEDGE DEFICIT GENERAL Goal: RH STG INCREASE KNOWLEDGE OF SELF CARE AFTER HOSPITALIZATION Description: Patient and spouse will be able to manage care using educational resources for medications, dietary modifications, safety and skin care independently Outcome: Progressing

## 2023-10-13 NOTE — Discharge Instructions (Addendum)
 Inpatient Rehab Discharge Instructions  Juan Brewer Discharge date and time:  10/27/2023  Activities/Precautions/ Functional Status: Activity: no lifting, driving, or strenuous exercise for until cleared by MD Diet: regular diet Wound Care: none needed Functional status:  ___ No restrictions     ___ Walk up steps independently _x__ 24/7 supervision/assistance   ___ Walk up steps with assistance ___ Intermittent supervision/assistance  ___ Bathe/dress independently ___ Walk with walker     ___ Bathe/dress with assistance ___ Walk Independently    ___ Shower independently ___ Walk with assistance    _x__ Shower with assistance __x_ No alcohol     ___ Return to work/school ________  Special Instructions: No driving, alcohol consumption or tobacco use.   COMMUNITY REFERRALS UPON DISCHARGE:    PRIVATE PT THEY HAVE COME AND SEE PATIENT HAS HAD HOME HEALTH AND OP IN THE PAST  WANT TO CONTINUE WITH THEM.    Medical Equipment/Items Ordered:HAS ALL EQUIPMENT FROM PAST ADMISSIONS                                                 Agency/Supplier:NA   My questions have been answered and I understand these instructions. I will adhere to these goals and the provided educational materials after my discharge from the hospital.  Patient/Caregiver Signature _______________________________ Date __________  Clinician Signature _______________________________________ Date __________  Please bring this form and your medication list with you to all your follow-up doctor's appointments.

## 2023-10-13 NOTE — Progress Notes (Signed)
 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  Weight-bear as tolerated, range of motion as tolerated  Can continue with ice and/or heat depending on patient response  Symptom management while at inpatient rehab.  Could trial topical anti-inflammatories given that this is a shoulder and are likely to be more effective as it is a more superficial joint.  Could consider reconsulting IR for steroid injection if refractory  Would recommend following up with a shoulder specialist at his convenience upon discharge for more chronic follow-up though no acute intervention needs to be pursued at this time  Mearl Latin, PA-C [phone] (C) 10/13/2023, 11:23 AM  [facility] [address] [place] [address] [phone] (F)        Patient ID: Juan Brewer, male   DOB: 1945-11-08, 78 y.o.   MRN: [id number]

## 2023-10-13 NOTE — Plan of Care (Signed)
 Patient discharging to CIR when a bed is available.

## 2023-10-13 NOTE — Progress Notes (Signed)
 Ranelle Oyster, MD  Physician Physical Medicine and Rehabilitation   PMR Pre-admission     Signed   Date of Service: 10/13/2023  9:21 AM  Related encounter: Admission (Discharged) from 10/06/2023 in [facility]   Signed     Expand All Collapse All  PMR Admission Coordinator Pre-Admission Assessment   Patient: Juan Brewer is an 78 y.o., male MRN: [id number] DOB: [birth year] Height: 5\' 11"  (180.3 cm) Weight:     Insurance Information HMO:     PPO:      PCP:      IPA:      80/20:      OTHER:  PRIMARY: Medicare A/B      Policy#: [id number]       Subscriber: pt CM Name:       Phone#:      Fax#:  Pre-Cert#: verified Health and safety inspector:  Benefits:  Phone #:      Name:  Eff. Date: 12/26/12 A/B     Deduct: $1676      Out of Pocket Max: n/a      Life Max: n/a CIR: 100%      SNF: 20 full days Outpatient: 80%     Co-Pay: 20% Home Health: 100%      Co-Pay:  DME: 80%     Co-Pay: 20% Providers:  SECONDARY: BCBS Supplement      Policy#: [id number]     Phone#: [phone]   Financial Counselor:       Phone#:    The "Data Collection Information Summary" for patients in Inpatient Rehabilitation Facilities with attached "Privacy Act Statement-Health Care Records" was provided and verbally reviewed with: Patient and Family   Emergency Contact Information Contact Information       Name Relation Home Work Mobile    PRICE,TERRI Spouse   [phone]           Other Contacts       Name Relation Home Work Mobile    Price,Will Son     [phone]           Current Medical History  Patient Admitting Diagnosis: encephalopathy   History of Present Illness: Pt is a 78 y/o male with PMH of CVA with residual R sided weakness, HTN, COPD, and 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  At [facility] neurology was consulted and stroke workup was  essentially negative, except for MRI which they could not perform due to VPS, so he was transferred to [facility] on 3/11.  Neurology consulted and noted R weakness with slight drift.  Neurosurgery consulted for VPS management and reduced setting to 60 mmHg.  On 3/14 pt developed increase RUE pain, redness.  Pt underwent xray and CT of R shoulder which revealed severe glenohumeral joint OA with moderate glenohumeral effusion with intra-articular loose bodies and mild/moderate degeneration of the AC joint.  Labs also showing elevated ESR/CRP and in conjunction with low grade fevers ID was consulted.  Recommendations to continue to ancef until blood cultures are back.  IR aspirated GH joint on 3/17.  Therapy evaluations completed and pt was recommended for CIR due to functional decline.     Complete NIHSS TOTAL: 4   Patient's medical record from [facility] (no records available from [facility]) has been reviewed by the rehabilitation admission coordinator and physician.   Past Medical History  Past Medical History:  Diagnosis Date   Arthritis      back   Hypertension     Stroke (cerebrum) (HCC)            Has the patient had major surgery during 100 days prior to admission? Yes   Family History   family history includes Dementia in his father; Healthy in his brother; Parkinson's disease in his father.   Current Medications  Current Medications    Current Facility-Administered Medications:    acetaminophen (TYLENOL) tablet 650 mg, 650 mg, Oral, Q4H PRN, 650 mg at 10/11/23 2117 **OR** acetaminophen (TYLENOL) 160 MG/5ML solution 650 mg, 650 mg, Per Tube, Q4H PRN **OR** acetaminophen (TYLENOL) suppository 650 mg, 650 mg, Rectal, Q4H PRN, Opyd, Lavone Neri, MD   ALPRAZolam Prudy Feeler) tablet 0.5 mg, 0.5 mg, Oral, Q6H PRN, Opyd, Lavone Neri, MD, 0.5 mg at 10/11/23 0911   atenolol (TENORMIN) tablet 25 mg, 25 mg, Oral, Daily, Opyd, Lavone Neri, MD, 25 mg at 10/12/23 2595   benzonatate (TESSALON) capsule 200 mg,  200 mg, Oral, TID, Rai, Ripudeep K, MD, 200 mg at 10/13/23 0841   calcium carbonate (TUMS - dosed in mg elemental calcium) chewable tablet 200-400 mg of elemental calcium, 1-2 tablet, Oral, BID PRN, Rai, Ripudeep K, MD, 200 mg of elemental calcium at 10/11/23 0911   cefadroxil (DURICEF) capsule 500 mg, 500 mg, Oral, BID, Danelle Earthly, MD   clopidogrel (PLAVIX) tablet 75 mg, 75 mg, Oral, Daily, Opyd, Lavone Neri, MD, 75 mg at 10/13/23 0858   enoxaparin (LOVENOX) injection 40 mg, 40 mg, Subcutaneous, Q24H, Opyd, Lavone Neri, MD, 40 mg at 10/12/23 2208   fluticasone furoate-vilanterol (BREO ELLIPTA) 100-25 MCG/ACT 1 puff, 1 puff, Inhalation, Daily, 1 puff at 10/13/23 0835 **AND** umeclidinium bromide (INCRUSE ELLIPTA) 62.5 MCG/ACT 1 puff, 1 puff, Inhalation, Daily, Opyd, Lavone Neri, MD, 1 puff at 10/13/23 0835   guaiFENesin-dextromethorphan (ROBITUSSIN DM) 100-10 MG/5ML syrup 5 mL, 5 mL, Oral, Q4H PRN, Rai, Ripudeep K, MD   ipratropium-albuterol (DUONEB) 0.5-2.5 (3) MG/3ML nebulizer solution 3 mL, 3 mL, Nebulization, Q6H PRN, Rai, Ripudeep K, MD   LORazepam (ATIVAN) injection 1 mg, 1 mg, Intravenous, Once PRN, Rai, Ripudeep K, MD   melatonin tablet 3 mg, 3 mg, Oral, QHS, Rai, Ripudeep K, MD, 3 mg at 10/12/23 2206   nicotine (NICODERM CQ - dosed in mg/24 hours) patch 21 mg, 21 mg, Transdermal, Daily, Opyd, Lavone Neri, MD, 21 mg at 10/13/23 0850   polyethylene glycol (MIRALAX / GLYCOLAX) packet 17 g, 17 g, Oral, Daily, Rai, Ripudeep K, MD, 17 g at 10/13/23 0841   prochlorperazine (COMPAZINE) injection 5 mg, 5 mg, Intravenous, Q6H PRN, Opyd, Lavone Neri, MD   senna-docusate (Senokot-S) tablet 1 tablet, 1 tablet, Oral, QHS PRN, Opyd, Lavone Neri, MD, 1 tablet at 10/12/23 2250   simvastatin (ZOCOR) tablet 40 mg, 40 mg, Oral, QHS, Opyd, Timothy S, MD, 40 mg at 10/12/23 2206   traZODone (DESYREL) tablet 50 mg, 50 mg, Oral, QHS, Opyd, Timothy S, MD, 50 mg at 10/12/23 2207   triazolam (HALCION) tablet 0.25 mg, 0.25 mg,  Oral, QHS, Opyd, Timothy S, MD, 0.25 mg at 10/12/23 2206     Patients Current Diet:  Diet Order                  Diet regular Fluid consistency: Thin  Diet effective now  Precautions / Restrictions Precautions Precautions: Fall Precaution/Restrictions Comments: Fear of Falling Restrictions Weight Bearing Restrictions Per Provider Order: No    Has the patient had 2 or more falls or a fall with injury in the past year? No   Prior Activity Level Limited Community (1-2x/wk): went out with family to the "office" a few times a week, assist to manage stairs, typically mod I with RW at home, mod I for ADLs, assist for iADLs   Prior Functional Level Self Care: Did the patient need help bathing, dressing, using the toilet or eating? Independent   Indoor Mobility: Did the patient need assistance with walking from room to room (with or without device)? Independent   Stairs: Did the patient need assistance with internal or external stairs (with or without device)? Needed some help   Functional Cognition: Did the patient need help planning regular tasks such as shopping or remembering to take medications? Needed some help   Patient Information Are you of Hispanic, Latino/a,or Spanish origin?: A. No, not of Hispanic, Latino/a, or Spanish origin What is your race?: A. White Do you need or want an interpreter to communicate with a doctor or health care staff?: 0. No   Patient's Response To:  Health Literacy and Transportation Is the patient able to respond to health literacy and transportation needs?: Yes Health Literacy - How often do you need to have someone help you when you read instructions, pamphlets, or other written material from your doctor or pharmacy?: Never In the past 12 months, has lack of transportation kept you from medical appointments or from getting medications?: No In the past 12 months, has lack of transportation kept you from meetings, work,  or from getting things needed for daily living?: No   Journalist, newspaper / Equipment Home Equipment: Grab bars - tub/shower, Information systems manager, Rollator (4 wheels), Cane - single point   Prior Device Use: Indicate devices/aids used by the patient prior to current illness, exacerbation or injury? Walker   Current Functional Level Cognition   Arousal/Alertness: Awake/alert Overall Cognitive Status: Impaired/Different from baseline Orientation Level: Oriented to person, Oriented to place, Oriented to situation, Disoriented to time Attention: Sustained Sustained Attention: Appears intact Memory: Impaired Memory Impairment: Storage deficit, Retrieval deficit, Decreased recall of new information (scored in moderate impairment on Cognistat (1/4 recalled, needing cues), recalled majority of what neurologist relayed) Awareness: Impaired Awareness Impairment: Intellectual impairment (states physical deficits but not cognitive) Problem Solving: Appears intact (for verbal, may break down in functional situation) Safety/Judgment: Appears intact (may have difficulty in functional situations)    Extremity Assessment (includes Sensation/Coordination)   Upper Extremity Assessment: Right hand dominant, RUE deficits/detail, Overall WFL for tasks assessed (LUE>RUE) RUE Deficits / Details: hx of 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. sensation intact, able to lift about 60* shoulder flexion against gravity. RUE: Shoulder pain with ROM RUE Sensation: WNL RUE Coordination: WNL  Lower Extremity Assessment: Overall WFL for tasks assessed     ADLs   Overall ADL's : Needs assistance/impaired Eating/Feeding: Independent, Sitting Grooming: Sitting, Contact guard assist, Wash/dry face Upper Body Bathing: Sitting, Contact guard assist Lower Body Bathing: Sitting/lateral leans, Minimal assistance Upper Body Dressing : Sitting, Contact guard assist Lower Body Dressing: Sitting/lateral leans, Contact guard assist Lower  Body Dressing Details (indicate cue type and reason): don bilat socks, anticiate need for MAx A to Mod A +2 for sts dressing Toilet Transfer: Maximal assistance, +2 for physical assistance, +2 for safety/equipment, Stand-pivot Toileting- Clothing Manipulation and Hygiene: Maximal assistance, +  2 for physical assistance, +2 for safety/equipment Functional mobility during ADLs: +2 for physical assistance, Maximal assistance, +2 for safety/equipment General ADL Comments: Focused session on STS with stedy and promotion of anterior leaning     Mobility   Overal bed mobility: Needs Assistance Bed Mobility: Sit to Supine, Sidelying to Sit, Rolling Rolling: Mod assist, Used rails Sidelying to sit: Max assist, +2 for physical assistance, +2 for safety/equipment, Used rails Supine to sit: Max assist, HOB elevated, Used rails Sit to supine: Max assist General bed mobility comments: cues for sequencing reach for bed rail with Roll Rt/Lt. Mod-max assist as pt fatigued. Max assist +2 fo sdielying to sit EOB. pt ultimately returned to supine after fatigued from attempts at stadnding.     Transfers   Overall transfer level: Needs assistance Equipment used: Ambulation equipment used Transfers: Sit to/from Stand, Bed to chair/wheelchair/BSC Sit to Stand: Via lift equipment, Max assist, +2 safety/equipment, +2 physical assistance, From elevated surface Bed to/from chair/wheelchair/BSC transfer type:: Via Lift equipment Stand pivot transfers: Mod assist, +2 physical assistance, +2 safety/equipment, From elevated surface Transfer via Lift Equipment: [name] General transfer comment: Attempted sit<>stand to [facility] with max +2 assist 3x from elevated EOB. pt with heavy lean Rt and posteriorly, Max assist needed to maintain upright sitting. Pt with poor endurance to maintain hand placement on Stedy crossbar. Maxisky used for [employer] transfer.     Ambulation / Gait / Stairs / Wheelchair Mobility    Ambulation/Gait General Gait Details: Pt declined.     Posture / Balance Balance Overall balance assessment: Needs assistance Sitting-balance support: Feet supported, Single extremity supported Sitting balance-Leahy Scale: Poor Postural control: Right lateral lean, Posterior lean Standing balance support: Bilateral upper extremity supported, During functional activity, Reliant on assistive device for balance Standing balance-Leahy Scale: Zero Standing balance comment: Reliant on therapists     Special needs/care consideration Diabetic management A1C 6    Previous Home Environment (from acute therapy documentation) Living Arrangements: Spouse/significant other  Lives With: Spouse Available Help at Discharge: Family, Available PRN/intermittently (wife works 2-3 days per week) Type of Home: House Home Layout: Able to live on main level with bedroom/bathroom, Two level Alternate Level Stairs-Number of Steps: pt reports he does not need to go upstairs Home Access: Stairs to enter Entrance Stairs-Rails: None, Right Entrance Stairs-Number of Steps: 1 Bathroom Shower/Tub: Engineer, manufacturing systems: Handicapped height Home Care Services: Yes Type of Home Care Services: Home PT Additional Comments: Spouse notes that pt has had some falls, pt denied having falls   Discharge Living Setting Plans for Discharge Living Setting: Patient's home, Lives with (comment) (spouse) Type of Home at Discharge: House Discharge Home Layout: Able to live on main level with bedroom/bathroom Discharge Home Access: Stairs to enter Entrance Stairs-Rails: Right Entrance Stairs-Number of Steps: 1 Discharge Bathroom Shower/Tub: Tub/shower unit Discharge Bathroom Toilet: Handicapped height Discharge Bathroom Accessibility: Yes How Accessible: Accessible via walker Does the patient have any problems obtaining your medications?: No   Social/Family/Support Systems Patient Roles: Spouse Anticipated  Caregiver: Myrtie Hawk Anticipated Caregiver's Contact Information: [phone] Ability/Limitations of Caregiver: n/a Caregiver Availability: 24/7 Discharge Plan Discussed with Primary Caregiver: Yes Is Caregiver In Agreement with Plan?: Yes Does Caregiver/Family have Issues with Lodging/Transportation while Pt is in Rehab?: No   Goals Patient/Family Goal for Rehab: PT/OT/SLP supervision to min assist Expected length of stay: 21-24 days Additional Information: Discharge plan: will discharge home with family providing 24/7 assist if needed Pt/Family Agrees to Admission and willing to  participate: Yes Program Orientation Provided & Reviewed with Pt/Caregiver Including Roles  & Responsibilities: Yes   Decrease burden of Care through IP rehab admission: n/a   Possible need for SNF placement upon discharge:  Not anticipated.  Plan for discharge home with family to provide 24/7 assist.    Patient Condition: I have reviewed medical records from [facility], spoken with CSW, and patient, spouse, and family member. I met with patient at the bedside for inpatient rehabilitation assessment.  Patient will benefit from ongoing PT, OT, and SLP, can actively participate in 3 hours of therapy a day 5 days of the week, and can make measurable gains during the admission.  Patient will also benefit from the coordinated team approach during an Inpatient Acute Rehabilitation admission.  The patient will receive intensive therapy as well as Rehabilitation physician, nursing, social worker, and care management interventions.  Due to bladder management, bowel management, safety, skin/wound care, disease management, medication administration, pain management, and patient education the patient requires 24 hour a day rehabilitation nursing.  The patient is currently max assist +2 with mobility and basic ADLs.  Discharge setting and therapy post discharge at home with home health is anticipated.  Patient has agreed to  participate in the Acute Inpatient Rehabilitation Program and will admit Saturday 10/10/23.   Preadmission Screen Completed By:  Stephania Fragmin, PT, DPT 10/13/2023 9:21 AM ______________________________________________________________________   Discussed status with Dr. Riley Kill on 10/13/23  at 9:21 AM  and received approval for admission today.   Admission Coordinator:  Stephania Fragmin, PT, DPT time 9:21 AM Dorna Bloom 10/13/23     Assessment/Plan: Diagnosis: NPH, RUE cellulitis, ?septic arthritis right shoulder Does the need for close, 24 hr/day Medical supervision in concert with the patient's rehab needs make it unreasonable for this patient to be served in a less intensive setting? Yes Co-Morbidities requiring supervision/potential complications: ID and pain considerations Due to bladder management, bowel management, safety, skin/wound care, disease management, medication administration, pain management, and patient education, does the patient require 24 hr/day rehab nursing? Yes Does the patient require coordinated care of a physician, rehab nurse, PT, OT, and SLP to address physical and functional deficits in the context of the above medical diagnosis(es)? Yes Addressing deficits in the following areas: balance, endurance, locomotion, strength, transferring, bowel/bladder control, bathing, dressing, feeding, grooming, toileting, cognition, and psychosocial support Can the patient actively participate in an intensive therapy program of at least 3 hrs of therapy 5 days a week? Yes The potential for patient to make measurable gains while on inpatient rehab is excellent Anticipated functional outcomes upon discharge from inpatient rehab: supervision and min assist PT, supervision and min assist OT, supervision SLP Estimated rehab length of stay to reach the above functional goals is: 21-24 days Anticipated discharge destination: Home 10. Overall Rehab/Functional Prognosis: excellent     MD  Signature: Ranelle Oyster, MD, [facility] Physical Medicine & Rehabilitation Medical Director Rehabilitation Services 10/13/2023            Revision History

## 2023-10-13 NOTE — H&P (Signed)
 Physical Medicine and Rehabilitation Admission H&P       CC: Functional deficits secondary to encephalopathy   HPI: Parrish, Bonn. 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 Lovell Sheehan in May 2024.  He also has a history of prior CVA with right-sided weakness and presented to [facility] on 10/04/2023 with increased weakness and confusion.  He was admitted there and neurology consulted.  Patient complained of increased right lower extremity weakness greater than baseline.  Stroke swallow evaluation was obtained and he was allowed p.o. intake.  CT head without acute findings, presence of ventriculostomy with normal ventricular size.  CTA of head and neck performed with no emergent large vessel occlusion or hemodynamically significant stenosis.  He also underwent echocardiogram with normal ejection fraction, no thrombus or PFO.  His hemoglobin A1c is 6%.  LDL 55.  Neurology recommended MRI of the brain without contrast but this could not be performed due to the presence of the patient's programmable VP shunt.  He was transferred to [facility] for consultation with Dr. Lovell Sheehan.  On arrival, noted to have significant cough by Dr. Amada Jupiter which had worsened over the past week.  He has suspicion is that patient likely had an acute Visiol stressor such as bronchitis, URI, etc.  Factious workup carried out.  Found to have urinary tract infection and started on Rocephin.  Culture grew mixed flora.  Viral panel negative.  Started on bronchodilators and Tessalon Perles.  Home Plavix and statin continued.  He has significant history of anxiety and insomnia and treated with triazolam and Xanax.  Started on melatonin for sleep.  He has a history of tobacco abuse 1 pack/day and NicoDerm patches placed.  Pressure injury noted of the right left mid buttock.  MRI completed without acute intracranial abnormality.  His VP  shunt was reprogrammed by neurosurgery after the MRI.  Developed acute onset of right shoulder pain on 3/16 with erythema and edema of right forearm and hand.  + fever. 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 Blood cultures obtained and Ancef IV started. Orthopedic surgery and ID consulted and joint aspiration performed. Fluid aspirate suggests pseudogout. Held off on ESI due to fever. Now on Duricef 500 mg BID thru 3/23 per ID recs..   Physical therapy note yesterday, the patient required mod-max assist for bed mobility today and max support initially for seated balance with heavy Rt and posterior lean as he fatigued. Attempted sit<>stand with Stedy 3x from elevated EOB, pt returned to supine due to fatigue. Cues for rolling Rt/Lt to place maxisky pad and pt transfer bed>chair dependently via lift. The patient requires inpatient medicine and rehabilitation evaluations and services for ongoing dysfunction secondary to encephalopathy.   Review of Systems  Constitutional:  Negative for chills.  Respiratory:  Positive for cough. Negative for shortness of breath.   Cardiovascular:  Negative for chest pain.  Gastrointestinal:  Negative for abdominal pain.  Genitourinary:        Has condom cath in place  Musculoskeletal:  Positive for back pain and joint pain.  Neurological:  Positive for weakness. Negative for headaches.       Feels dizzy "sometimes" when OOB  Psychiatric/Behavioral:  The patient is not nervous/anxious and does not have insomnia.         Past Medical History:  Diagnosis Date   Arthritis  back   Hypertension     Stroke (cerebrum) [facility])               Past Surgical History:  Procedure Laterality Date   CHOLECYSTECTOMY       LUMBAR DISC SURGERY   1971   VENTRICULOPERITONEAL SHUNT Right 12/25/2022    Procedure: RIGHT SHUNT INSERTION VENTRICULAR-PERITONEAL;  Surgeon: Tressie Stalker, MD;  Location: [facility] OR;  Service: Neurosurgery;   Laterality: Right;  RM 21PATIENT CANT MOVE UP             Family History  Problem Relation Age of Onset   Dementia Father     Parkinson's disease Father     Healthy Brother     Stroke Neg Hx     Neuropathy Neg Hx          Social History:  reports that he has been smoking cigarettes. He has never used smokeless tobacco. He reports that he does not drink alcohol and does not use drugs. Allergies:  Allergies  No Known Allergies         Medications Prior to Admission  Medication Sig Dispense Refill   acetaminophen (TYLENOL) 500 MG tablet Take 1,000 mg by mouth every 6 (six) hours as needed for mild pain (pain score 1-3) or moderate pain (pain score 4-6).       ALPRAZolam (XANAX) 0.5 MG tablet Take 0.5 mg by mouth 4 (four) times daily.       Ascorbic Acid (VITAMIN C PO) Take 1 tablet by mouth daily.       atenolol (TENORMIN) 25 MG tablet Take 25 mg by mouth daily.       calcium carbonate (TUMS - DOSED IN MG ELEMENTAL CALCIUM) 500 MG chewable tablet Chew 2 tablets by mouth daily.       clopidogrel (PLAVIX) 75 MG tablet Take 75 mg by mouth daily.       docusate sodium (COLACE) 100 MG capsule Take 1 capsule (100 mg total) by mouth 2 (two) times daily. (Patient taking differently: Take 100 mg by mouth 2 (two) times daily as needed for mild constipation or moderate constipation.) 30 capsule 0   Fluticasone-Umeclidin-Vilant (TRELEGY ELLIPTA) 100-62.5-25 MCG/ACT AEPB Inhale 1 puff into the lungs daily.       ibuprofen (ADVIL) 200 MG tablet Take 400 mg by mouth 3 (three) times daily as needed for moderate pain (pain score 4-6).       losartan (COZAAR) 100 MG tablet Take 100 mg by mouth daily.       Multiple Vitamins-Minerals (ZINC PO) Take 1 tablet by mouth daily.       nicotine (NICODERM CQ - DOSED IN MG/24 HOURS) 14 mg/24hr patch Place 1 patch (14 mg total) onto the skin daily. 28 patch 0   simvastatin (ZOCOR) 40 MG tablet Take 40 mg by mouth at bedtime.       traZODone (DESYREL) 50 MG  tablet Take 50 mg by mouth at bedtime.       triazolam (HALCION) 0.25 MG tablet Take 0.25 mg by mouth at bedtime.                  Home: Home Living Family/patient expects to be discharged to:: Private residence Living Arrangements: Spouse/significant other Available Help at Discharge: Family, Available PRN/intermittently (wife works 2-3 days per week) Type of Home: House Home Access: Stairs to enter [employer] of Steps: 1 Entrance Stairs-Rails: None, Right Home Layout: Able to live on main level with bedroom/bathroom, Two  level Alternate Level Stairs-Number of Steps: pt reports he does not need to go upstairs Bathroom Shower/Tub: Engineer, manufacturing systems: Handicapped height Home Equipment: Grab bars - tub/shower, Information systems manager, Rollator (4 wheels), Cane - single point Additional Comments: Spouse notes that pt has had some falls, pt denied having falls  Lives With: Spouse   Functional History: Prior Function Prior Level of Function : Independent/Modified Independent, History of Falls (last six months) Mobility Comments: Mod I with Rollator or spc ADLs Comments: ind   Functional Status:  Mobility: Bed Mobility Overal bed mobility: Needs Assistance Bed Mobility: Sit to Supine Rolling: Mod assist, Used rails Sidelying to sit: Max assist, +2 for safety/equipment Supine to sit: Max assist, HOB elevated, Used rails Sit to supine: Mod assist, HOB elevated, Used rails General bed mobility comments: cuing for sequencing, assistance to move BLE on/off EOB, upright trunk to sitting Transfers Overall transfer level: Needs assistance Equipment used: Ambulation equipment used Transfers: Sit to/from Stand, Bed to chair/wheelchair/BSC Sit to Stand: Max assist, From elevated surface, Via lift equipment Bed to/from chair/wheelchair/BSC transfer type:: Via Lift equipment Stand pivot transfers: Mod assist, +2 physical assistance, +2 safety/equipment, From elevated  surface Transfer via Lift Equipment: Stedy General transfer comment: STS x2 from recliner max A with cues for hand placement. Pt needing assist to position R hand on stedy. R lateral lean in standing, only correctable with manual facilitation. Max A +2 for stedy transfer to bed, pt getting fatigued and not able to assist as much with standing from perched position. Ambulation/Gait General Gait Details: Pt declined.   ADL: ADL Overall ADL's : Needs assistance/impaired Eating/Feeding: Independent, Sitting Grooming: Sitting, Contact guard assist, Wash/dry face Upper Body Bathing: Sitting, Contact guard assist Lower Body Bathing: Sitting/lateral leans, Minimal assistance Upper Body Dressing : Sitting, Contact guard assist Lower Body Dressing: Sitting/lateral leans, Contact guard assist Lower Body Dressing Details (indicate cue type and reason): don bilat socks, anticiate need for MAx A to Mod A +2 for sts dressing Toilet Transfer: Maximal assistance, +2 for physical assistance, +2 for safety/equipment, Stand-pivot Toileting- Clothing Manipulation and Hygiene: Maximal assistance, +2 for physical assistance, +2 for safety/equipment Functional mobility during ADLs: +2 for physical assistance, Maximal assistance, +2 for safety/equipment General ADL Comments: Focused session on STS with stedy and promotion of anterior leaning   Cognition: Cognition Overall Cognitive Status: Impaired/Different from baseline Arousal/Alertness: Awake/alert Orientation Level: Oriented to person, Oriented to place, Oriented to situation Year: 2025 Day of Week: Incorrect Attention: Sustained Sustained Attention: Appears intact Memory: Impaired Memory Impairment: Storage deficit, Retrieval deficit, Decreased recall of new information (scored in moderate impairment on Cognistat (1/4 recalled, needing cues), recalled majority of what neurologist relayed) Awareness: Impaired Awareness Impairment: Intellectual  impairment (states physical deficits but not cognitive) Problem Solving: Appears intact (for verbal, may break down in functional situation) Safety/Judgment: Appears intact (may have difficulty in functional situations) Cognition Arousal: Alert Behavior During Therapy: Anxious Overall Cognitive Status: Impaired/Different from baseline   Physical Exam: Blood pressure 119/78, pulse 70, temperature 98.4 F (36.9 C), temperature source Oral, resp. rate 16, height 5\' 11"  (1.803 m), SpO2 93%. Physical Exam Constitutional:      General: He is not in acute distress.    Appearance: He is not ill-appearing.  HENT:     Head: Normocephalic and atraumatic.     Right Ear: External ear normal.     Left Ear: External ear normal.     Nose: Nose normal.     Mouth/Throat:  Mouth: Mucous membranes are moist.     Pharynx: Oropharynx is clear.  Eyes:     Extraocular Movements: Extraocular movements intact.     Conjunctiva/sclera: Conjunctivae normal.     Pupils: Pupils are equal, round, and reactive to light.  Cardiovascular:     Rate and Rhythm: Normal rate and regular rhythm.     Heart sounds: No murmur heard.    No gallop.  Pulmonary:     Effort: Pulmonary effort is normal. No respiratory distress.     Breath sounds: No wheezing or rales.  Abdominal:     General: Bowel sounds are normal. There is no distension.     Palpations: Abdomen is soft.     Tenderness: There is no abdominal tenderness.  Genitourinary:    Comments: Has condom cath in place draining amber colored urine Musculoskeletal:     Cervical back: Normal range of motion.     Right lower leg: No edema.     Left lower leg: No edema.     Comments: Mild right hand forearm edema. Right shoulder tender with minimal flexion, IR/ER. Mild shoulder tenderness to touch. Low back tender with bed mobility, flexion/rotation.   Skin:    Comments: Diffuse bruising/ecchymoses as well as skin lacs and abrasions of various stages on all 4  limbs. Right forearm with mild erythema, no warmth--doesn't look a whole lot different than left forearm. A few abrasions dressed.  Neurological:     Mental Status: He is alert and oriented to person, place, and time.     Comments: Pt is alert and oriented to person, place, month, year. Thought it was the 18th. CN exam non-focal. Fair insight and awareness. Able to provide some biographical information. Focus improved. MMT: RUE limited by shoulder pain 2-3/5 prox to 4/5 distally. LUE grossly 4- to 4+/5. RLE and LLE-- 3/5 HF, 3+ KE with pain compontnents. 4+/5 ADF/PF. Found no consistent sensory loss. No cerebellar signs or abnormal reflexes.   Psychiatric:        Mood and Affect: Mood normal.        Behavior: Behavior normal.     Comments: Pt is pleasant and cooperative        Lab Results Last 48 Hours        Results for orders placed or performed during the hospital encounter of 10/06/23 (from the past 48 hours)  CBC     Status: Abnormal    Collection Time: 10/08/23 10:15 AM  Result Value Ref Range    WBC 7.9 4.0 - 10.5 K/uL    RBC 4.21 (L) 4.22 - 5.81 MIL/uL    Hemoglobin 12.9 (L) 13.0 - 17.0 g/dL    HCT 48.5 (L) 46.2 - 52.0 %    MCV 87.4 80.0 - 100.0 fL    MCH 30.6 26.0 - 34.0 pg    MCHC 35.1 30.0 - 36.0 g/dL    RDW 70.3 50.0 - 93.8 %    Platelets 139 (L) 150 - 400 K/uL    nRBC 0.0 0.0 - 0.2 %      Comment: Performed at [facility], [address] [place], [place] [address]  Basic metabolic panel     Status: Abnormal    Collection Time: 10/08/23 10:15 AM  Result Value Ref Range    Sodium 139 135 - 145 mmol/L    Potassium 3.6 3.5 - 5.1 mmol/L    Chloride 104 98 - 111 mmol/L    CO2 22 22 - 32  mmol/L    Glucose, Bld 143 (H) 70 - 99 mg/dL      Comment: Glucose reference range applies only to samples taken after fasting for at least 8 hours.    BUN 18 8 - 23 mg/dL    Creatinine, Ser 4.09 (H) 0.61 - 1.24 mg/dL    Calcium 9.1 8.9 - 81.1 mg/dL    GFR, Estimated 53 (L) >60  mL/min      Comment: (NOTE) Calculated using the CKD-EPI Creatinine Equation (2021)      Anion gap 13 5 - 15      Comment: Performed at [facility], [address], [place], [place] [address]  CBC     Status: Abnormal    Collection Time: 10/09/23  6:56 AM  Result Value Ref Range    WBC 9.8 4.0 - 10.5 K/uL    RBC 4.24 4.22 - 5.81 MIL/uL    Hemoglobin 12.8 (L) 13.0 - 17.0 g/dL    HCT 29.5 (L) 62.1 - 52.0 %    MCV 88.0 80.0 - 100.0 fL    MCH 30.2 26.0 - 34.0 pg    MCHC 34.3 30.0 - 36.0 g/dL    RDW 30.8 65.7 - 84.6 %    Platelets 134 (L) 150 - 400 K/uL    nRBC 0.0 0.0 - 0.2 %      Comment: Performed at [facility], [address] [place], [place] [address]  Basic metabolic panel     Status: Abnormal    Collection Time: 10/09/23  6:56 AM  Result Value Ref Range    Sodium 136 135 - 145 mmol/L    Potassium 3.7 3.5 - 5.1 mmol/L    Chloride 105 98 - 111 mmol/L    CO2 22 22 - 32 mmol/L    Glucose, Bld 131 (H) 70 - 99 mg/dL      Comment: Glucose reference range applies only to samples taken after fasting for at least 8 hours.    BUN 21 8 - 23 mg/dL    Creatinine, Ser 2.84 (H) 0.61 - 1.24 mg/dL    Calcium 9.3 8.9 - 13.2 mg/dL    GFR, Estimated 59 (L) >60 mL/min      Comment: (NOTE) Calculated using the CKD-EPI Creatinine Equation (2021)      Anion gap 9 5 - 15      Comment: Performed at [facility], [address], [place], [place] [address]      Imaging Results (Last 48 hours)  No results found.         Blood pressure 119/78, pulse 70, temperature 98.4 F (36.9 C), temperature source Oral, resp. rate 16, height 5\' 11"  (1.803 m), SpO2 93%.   Medical Problem List and Plan: 1. Functional deficits secondary to acute encephalopathy d/t NPH             -patient may shower             -ELOS/Goals: 21-24 days, supervision to min assist with PT, OT, SLP   2.  Antithrombotics: -DVT/anticoagulation:  Pharmaceutical: Lovenox             -antiplatelet therapy:  Plavix    3. Pain Management: Tylenol as needed   4. Mood/Behavior/Sleep: LCSW to evaluate and provide emotional support             -anxiety: Getting Xanax 0.5 mg every 6 hours as needed             -  continue triazolam 0.25 mg daily at bedtime             -continue melatonin 3 mg daily at bedtime             -continue trazodone 50 mg daily at bedtime             -antipsychotic agents: n/a             -keep sleep chart 5. Neuropsych/cognition: This patient is  capable of making decisions on his own behalf.   6. Skin/Wound Care: Routine skin care checks             -local care as needed to multiple lacs/abrasions/bruises 7. 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 Dorris Fetch and plan surveillance CT in 2-3 months   14: Tobacco use: Continue NicoDerm 21 mg   15: AKI atop ? CKD: BUN/Cr currently WNL             -follow-up BMP   16: Thrombocytopenia: improving -Follow-up CBC.   17: Right shoulder pain with glenohumeral joint effusion s/p aspiration 1mL joint fluid             - jt fluid with 1800 wbc's and neutrophils 50%, and calcium pyrophosphate crystals; gram stain with rare wbc's, cx pending. 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             -will try voltaren gel initially             -consider steroid injection depending upon course             -?outpt f/u with shoulder specialist.   18: Right forearm cellulitis: fever resolved, no leukocytosis -now on cefafroxil 500 mg BID for 10 doses (thru 3/23)     Milinda Antis, PA-C 10/13/2023  I have personally performed a face to face diagnostic evaluation of this patient and formulated the key components of the plan.  Additionally, I have personally reviewed laboratory data, imaging studies, as well as relevant notes and concur with the physician assistant's documentation above.  The patient's status has not changed from the original H&P.  Any changes in documentation from the acute care chart have been noted above.  Ranelle Oyster, MD, [place]

## 2023-10-13 NOTE — Discharge Summary (Signed)
 Physician Discharge Summary   Patient: Juan Brewer MRN: [id number] DOB: 1946/04/11  Admit date:     10/06/2023  Discharge date: 10/13/23  Discharge Physician: Thad Ranger, MD    PCP: Center, [facility]   Recommendations at discharge:   Continue cefadroxil 500 mg twice daily for 5 more days Outpatient follow-up with Dr. Carola Frost for right shoulder ESI injection or can be attempted inpatient once patient has completed antibiotic course Please follow venous Doppler ultrasound right arm  Discharge Diagnoses:  Acute encephalopathy History of prior CVA with residual right sided weakness   Essential hypertension   (Idiopathic) normal pressure hydrocephalus (HCC)   COPD (chronic obstructive pulmonary disease) (HCC)   History of CVA (cerebrovascular accident)   UTI (urinary tract infection)   Anxiety   Insomnia   Right shoulder pain, acute   Pressure injury of skin   Cellulitis of right hand   Hospital Course:  Patient is a 78 year old male with HTN, COPD, anxiety, CVA with residual right-sided weakness, 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 At his baseline, the patient gets around with a walker and is able to perform his ADLs.  For the past week, the patient has been too weak to get up or ambulate on his own, has had increased confusion, and difficulty with ADLs.  His weakness has been more pronounced on the right side.  [place] Course: Patient was admitted to [facility] on 10/04/2023.  Neurology was consulted and he underwent evaluation that included: Stroke swallow evaluation was passed.  CT head with no acute findings, ventriculostomy present and ventricular size normal.  CTA head and neck with no emergent large vessel occlusion or hemodynamically significant stenosis.  Echocardiogram with normal EF, no thrombus, and no PFO.  Hemoglobin  A1c of 6%.  Total cholesterol 110, LDL 55, HDL 30.  He was seen by PT and OT and recommended for inpatient rehabilitation.   Neurology recommended MRI of the brain without contrast but this could not be performed at [facility] due to the presence of the patient's programmable VP shunt.  Dr. Lovell Sheehan of neurosurgery was contacted and agreed with sending the patient to [facility] for MRI brain.   Assessment and Plan:   Acute encephalopathy, Weakness with confusion History of prior CVA - Was seen by neurology at [facility], had normal TSH, elevated B12, no acute findings on head CT or CTA head & neck, no thrombus or PFO on echo, and no improvement with treatment of suspected cystitis.  MRI could not be completed at [facility] d/t VP shunt.  -MRI brain completed, no acute intracranial abnormality. -VP shunt was reprogrammed by neurosurgery after the MRI. -Rest of the stroke workup was already done at [facility] including CTA head and neck, 2D echo. -LDL 55, A1c 6 -Continue Plavix 75 mg daily, simvastatin 40 mg daily -PT evaluation recommended CIR -Repeat CT head on 3/17, negative for any acute intracranial abnormality    Right shoulder pain, acute -X-ray showed no acute displaced fracture or dislocation -Right shoulder CT showed severe glenohumeral joint osteoarthritis, moderate glenohumeral joint effusion containing several intra-articular loose bodies, mild to moderate degenerative changes of the acromioclavicular joint -Orthopedics consulted, seen by Dr. Carola Frost, recommended IR joint aspiration and ESI -Elevated ESR, CRP.  Procalcitonin mildly elevated, low-grade fevers, underwent right shoulder joint aspiration on 10/12/2023 by  IR.  Did not get ESI due to fever. -Started on IV Ancef on 3/16, blood cultures negative so far, cultures from the joint aspiration on 3/17, negative so far -Discussed with ID today, Dr. Thedore Mins, recommended cefadroxil 500 mg twice daily  for 5 more days   Right hand/forearm cellulitis vs phlebitis -Improving, was placed on IV Ancef on 3/17.  ID was consulted.  Patient had reported at some point he had an IV in the right forearm, placed on 3/11 and pulled on 3/14. -Follow venous Dopplers RUE to rule out any SVT/DVT     Hx of NPH   - S/p VP shunt placed in May 2024  - Normal ventricular size noted on CT from 10/04/23   -VP shunt reprogrammed by neurosurgery after the MRI   Urinary tract infection - Treated with IV Rocephin at [facility], culture grew mixed flora  -Repeat UA negative for UTI   Cough/bronchitis/COPD exacerbation -Flu, COVID, RSV negative. -Chest x-ray had shown lobular masslike densities in the left upper lobe, discussed in detail with patient's daughter.  Patient has had several CT scans of the chest, had undergone CT-guided biopsy on 06/29/2023 which was negative for malignancy.  Per daughter, patient's PCP, CT surgery, Dr. Dorris Fetch are on " top of everything", declined any further imaging during this admission. -Continue Tessalon Perles, Robitussin, Breo Ellipta, Incruse Ellipta, scheduled DuoNebs. -Wheezing resolved   Hypertension  -Continue atenolol    Anxiety, insomnia  -Continue triazolam, Xanax -Continue melatonin as needed   Tobacco abuse  - Smoking 1 ppd, trying to cut back       Pressure injury documentation Right, left mid stage I buttocks, POA   Estimated body mass index is 23.43 kg/m as calculated from the following:   Height as of this encounter: 5\' 11"  (1.803 m).   Weight as of 07/14/23: 76.2 kg.     Pain control - [employer] Controlled Substance Reporting System database was reviewed. and patient was instructed, not to drive, operate heavy machinery, perform activities at heights, swimming or participation in water activities or provide baby-sitting services while on Pain, Sleep and Anxiety Medications; until their outpatient Physician has advised to do so again.  Also recommended to not to take more than prescribed Pain, Sleep and Anxiety Medications.  Consultants: Neurology, neurosurgery, infectious disease, CIR, IR Procedures performed: MRI brain, right shoulder joint aspiration Disposition:  CIR, inpatient rehab Diet recommendation: Regular diet  DISCHARGE MEDICATION: Allergies as of 10/13/2023   No Known Allergies      Medication List     STOP taking these medications    losartan 100 MG tablet Commonly known as: COZAAR       TAKE these medications    acetaminophen 500 MG tablet Commonly known as: TYLENOL Take 1,000 mg by mouth every 6 (six) hours as needed for mild pain (pain score 1-3) or moderate pain (pain score 4-6).   ALPRAZolam 0.5 MG tablet Commonly known as: XANAX Take 0.5 mg by mouth 4 (four) times daily.   atenolol 25 MG tablet Commonly known as: TENORMIN Take 25 mg by mouth daily.   calcium carbonate 500 MG chewable tablet Commonly known as: TUMS - dosed in mg elemental calcium Chew 2 tablets by mouth daily.   clopidogrel 75 MG tablet Commonly known as: PLAVIX Take 75 mg by mouth daily.   docusate sodium 100 MG capsule Commonly known as: COLACE Take 1 capsule (100 mg total) by mouth 2 (two) times daily. What changed:  when  to take this reasons to take this   ibuprofen 200 MG tablet Commonly known as: ADVIL Take 400 mg by mouth 3 (three) times daily as needed for moderate pain (pain score 4-6).   nicotine 14 mg/24hr patch Commonly known as: NICODERM CQ - dosed in mg/24 hours Place 1 patch (14 mg total) onto the skin daily.   simvastatin 40 MG tablet Commonly known as: ZOCOR Take 40 mg by mouth at bedtime.   traZODone 50 MG tablet Commonly known as: DESYREL Take 50 mg by mouth at bedtime.   Trelegy Ellipta 100-62.5-25 MCG/ACT Aepb Generic drug: Fluticasone-Umeclidin-Vilant Inhale 1 puff into the lungs daily.   triazolam 0.25 MG tablet Commonly known as: HALCION Take 0.25 mg by mouth at  bedtime.   VITAMIN C PO Take 1 tablet by mouth daily.   ZINC PO Take 1 tablet by mouth daily.        Follow-up Information     Center, [facility]. Schedule an appointment as soon as possible for a visit in 2 week(s).   Why: for hospital follow-up Contact information: [address] [place] [address] [phone]         [facility]. Schedule an appointment as soon as possible for a visit in 4 week(s).   Specialty: Neurology Why: for hospital follow-up Contact information: [address] [place] [address] [phone]        Myrene Galas, MD. Schedule an appointment as soon as possible for a visit in 2 week(s).   Specialty: Orthopedic Surgery Why: for hospital follow-up/right shoulder Contact information: [address] [place] [address] [phone]                Discharge Exam: Patient looking forward to go to inpatient rehab today.  Wife at the bedside.  Right forearm, hand cellulitis improving able to lift his arm today.  BP 94/66   Pulse 68   Temp 98.2 F (36.8 C) (Oral)   Resp 16   Ht 5\' 11"  (1.803 m)   SpO2 93%   BMI 23.43 kg/m   Physical Exam General: Alert and oriented x 3, NAD Cardiovascular: S1 S2 clear, RRR.  Respiratory: CTAB, no wheezing, rales or rhonchi Gastrointestinal: Soft, nontender, nondistended, NBS Ext: no pedal edema bilaterally, right forearm/hand cellulitis, edema, redness improving Neuro: no new deficits Psych: Normal affect    Condition at discharge: fair  The results of significant diagnostics from this hospitalization (including imaging, microbiology, ancillary and laboratory) are listed below for reference.   Imaging Studies: CT HEAD WO CONTRAST ( ) Result Date: 10/12/2023 CLINICAL DATA:  Initial evaluation for mental status change, unknown cause. EXAM: CT HEAD WITHOUT CONTRAST TECHNIQUE: Contiguous axial images were obtained  from the base of the skull through the vertex without intravenous contrast. RADIATION DOSE REDUCTION: This exam was performed according to the departmental dose-optimization program which includes automated exposure control, adjustment of the mA and/or kV according to patient size and/or use of iterative reconstruction technique. COMPARISON:  Prior study from 10/07/2023. FINDINGS: Brain: Mild age-related cerebral atrophy. Patchy and confluent hypodensity involving the supratentorial cerebral white matter, consistent with chronic small vessel ischemic disease, moderately advanced in nature. No acute intracranial hemorrhage. No acute large vessel territory infarct. No mass lesion or midline shift. Right occipital approach VP shunt catheter in place with tip terminating at the right splenium. Stable ventricular size and morphology without hydrocephalus. No extra-axial fluid collection. Vascular: No abnormal hyperdense vessel. Scattered vascular calcifications  noted within the carotid siphons. Skull: Scalp soft tissues demonstrate no acute finding. Calvarium intact. Sinuses/Orbits: Globes and orbital soft tissues within normal limits. Chronic sphenoethmoidal and maxillary sinus disease noted. Trace right mastoid effusion noted, of doubtful significance. Other: None. IMPRESSION: 1. No acute intracranial abnormality. 2. Right occipital approach VP shunt catheter in place with tip terminating at the right splenium. Stable ventricular size and morphology without hydrocephalus. 3. 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 Electronically Signed   By: Rise Mu M.D.   On: 10/12/2023 23:03   DG FL ASP/INJ MAJOR (SHOULDER, HIP, KNEE) Result Date: 10/12/2023 CLINICAL DATA:  Patient is a 78 y/o male with right shoulder pain. Patient presents for a diagnostic right shoulder aspiration due to pain and a right glenohumeral joint effusion on CT scan. EXAM: Right   SHOULDER aspiration UNDER FLUOROSCOPY TECHNIQUE: An appropriate skin entrance site was determined. The site was marked, prepped with Betadine, draped in the usual sterile fashion, and infiltrated locally with buffered Lidocaine. 2 mL lidocaine injection. A 18 gauge spinal needle was advanced to the inferior medial margin of the humeral head under intermittent fluoroscopy. 1mL of fluid was aspirated from the joint space. There was no immediate complication. FLUOROSCOPY: Radiation Exposure Index (as provided by the fluoroscopic device): 1.20 mGy Kerma FINDINGS: 1 mL of cloudy yellow fluid obtained from the right glenohumeral joint. IMPRESSION: Technically successful right shoulder aspiration for culture obtained. Performed by Philipp Ovens PA-C Electronically Signed   By: Corlis Leak M.D.   On: 10/12/2023 17:55   CT SHOULDER RIGHT WO CONTRAST Result Date: 10/10/2023 CLINICAL DATA:  Chronic shoulder pain EXAM: CT OF THE UPPER RIGHT EXTREMITY WITHOUT CONTRAST TECHNIQUE: Multidetector CT imaging of the upper right extremity was performed according to the standard protocol. RADIATION DOSE REDUCTION: This exam was performed according to the departmental dose-optimization program which includes automated exposure control, adjustment of the mA and/or kV according to patient size and/or use of iterative reconstruction technique. COMPARISON:  X-ray 10/09/2023 FINDINGS: Bones/Joint/Cartilage No acute fracture. No dislocation. Severe glenohumeral joint osteoarthritis manifested by joint space narrowing, subchondral sclerosis/cystic change, and marginal osteophyte formation. Mild superior glenoid retroversion. A moderate sized glenohumeral joint effusion is evident. 1.4 x 0.9 cm ossified loose body in the subscapularis joint recess. Smaller intra-articular loose bodies at the posterior joint line of the glenohumeral joint. Mild-moderate degenerative changes of the acromioclavicular joint. No large subacromial-subdeltoid  bursal fluid collection. Remaining visualized osseous structures appear grossly intact. Ligaments Suboptimally assessed by CT. Muscles and Tendons Preserved muscle bulk of the rotator cuff musculature without significant atrophy or fatty infiltration. Rotator cuff tendons appear grossly intact within the limitations of CT. Soft tissues No soft tissue fluid collection or hematoma. No axillary lymphadenopathy. Severe emphysematous changes within the included right lung field. IMPRESSION: 1. Severe glenohumeral joint osteoarthritis. Moderate glenohumeral joint effusion containing several intra-articular loose bodies. 2. Mild-moderate degenerative changes of the acromioclavicular joint. 3. Severe emphysematous changes within the included right lung field (ICD10-J43.9). Electronically Signed   By: Duanne Guess D.O.   On: 10/10/2023 17:48   DG Shoulder Right Result Date: 10/09/2023 CLINICAL DATA:  144615 Pain 144615 EXAM: RIGHT SHOULDER - 2+ VIEW COMPARISON:  None Available. FINDINGS: There is no evidence of fracture or dislocation. Severe degenerative changes of the right shoulder. Soft tissues are unremarkable. Ventriculoperitoneal shunt partially visualized. Lines overlie the chest. IMPRESSION: No acute displaced fracture or dislocation. Electronically Signed   By: Tish Frederickson M.D.   On:  10/09/2023 19:21   MR BRAIN WO CONTRAST Result Date: 10/07/2023 CLINICAL DATA:  Acute neurologic deficit EXAM: MRI HEAD WITHOUT CONTRAST TECHNIQUE: Multiplanar, multiecho pulse sequences of the brain and surrounding structures were obtained without intravenous contrast. COMPARISON:  12/22/2020 FINDINGS: Brain: No acute infarct, mass effect or extra-axial collection. Fewer than 5 scattered microhemorrhages in a nonspecific pattern. There is multifocal hyperintense T2-weighted signal within the white matter. Generalized volume loss. The midline structures are normal. Right parietal approach shunt catheter traversing the  posterior right lateral ventricle, in unchanged position. Vascular: Normal flow voids. Skull and upper cervical spine: Normal calvarium and skull base. Visualized upper cervical spine and soft tissues are normal. Sinuses/Orbits:Moderate paranasal sinus disease. Bilateral ocular lens replacements. IMPRESSION: 1. No acute intracranial abnormality. 2. Right parietal approach shunt catheter traversing the posterior right lateral ventricle, in unchanged position. No hydrocephalus. Electronically Signed   By: Deatra Robinson M.D.   On: 10/07/2023 18:57   DG Chest Port 1 View Result Date: 10/07/2023 CLINICAL DATA:  Cough. EXAM: PORTABLE CHEST 1 VIEW COMPARISON:  May 19, 2023. FINDINGS: The heart size and mediastinal contours are within normal limits. Right-sided ventricular peritoneal shunt is noted. Lobular masslike densities are noted in left upper lobe corresponding to possible malignancy as noted on prior CT scan. Minimal right basilar subsegmental atelectasis or infiltrate is noted. Emphysematous disease is noted. The visualized skeletal structures are unremarkable. IMPRESSION: Lobular masslike densities are again noted in left upper lobe corresponding to possible malignancy as noted on prior CT scan. Minimal right basilar subsegmental atelectasis or scarring is noted. Emphysema (ICD10-J43.9). Electronically Signed   By: Lupita Raider M.D.   On: 10/07/2023 18:49    Microbiology: Results for orders placed or performed during the hospital encounter of 10/10/23  Anaerobic culture w Gram Stain     Status: None (Preliminary result)   Collection Time: 10/12/23  1:16 PM   Specimen: Synovium  Result Value Ref Range Status   Specimen Description SYNOVIAL RIGHT SHOULDER  Final   Special Requests NONE  Final   Gram Stain   Final    RARE WBC PRESENT,BOTH PMN AND MONONUCLEAR NO ORGANISMS SEEN Performed at [facility], [address] [place], [place] [address]    Culture PENDING  Incomplete   Report  Status PENDING  Incomplete    Labs: CBC: Recent Labs  Lab 10/08/23 1015 10/09/23 0656 10/11/23 0630 10/12/23 0509 10/13/23 0508  WBC 7.9 9.8 12.0* 10.2 10.4  HGB 12.9* 12.8* 11.8* 11.7* 11.5*  HCT 36.8* 37.3* 33.7* 33.8* 34.3*  MCV 87.4 88.0 87.1 87.3 89.3  PLT 139* 134* 117* 118* 143*   Basic Metabolic Panel: Recent Labs  Lab 10/08/23 1015 10/09/23 0656 10/11/23 0630 10/12/23 0509 10/13/23 0508 10/13/23 0509  NA 139 136 135 136  --  136  K 3.6 3.7 3.9 4.1  --  4.2  CL 104 105 104 103  --  106  CO2 22 22 22 24   --  22  GLUCOSE 143* 131* 140* 144*  --  131*  BUN 18 21 21 22   --  23  CREATININE 1.38* 1.27* 1.42* 1.16 1.25* 1.24  CALCIUM 9.1 9.3 8.9 8.9  --  8.6*  PHOS  --   --  2.4* 2.2*  --  2.4*   Liver Function Tests: Recent Labs  Lab 10/11/23 0630 10/12/23 0509 10/13/23 0509  ALBUMIN 2.5* 2.3* 2.1*   CBG: No results for input(s): "GLUCAP" in the last 168 hours.  Discharge  time spent: greater than 30 minutes.  Signed: Thad Ranger, MD [facility] 10/13/2023

## 2023-10-13 NOTE — Progress Notes (Signed)
 Inpatient Rehab Admissions Coordinator:   Pt is stable for CIR admission today.  I will make arrangements.   Estill Dooms, PT, DPT Admissions Coordinator [phone] 10/13/23  9:20 AM

## 2023-10-13 NOTE — TOC Transition Note (Signed)
 Transition of Care [facility]) - Discharge Note   Patient Details  Name: Juan Brewer MRN: [id number] Date of Birth: 1946-04-14  Transition of Care [facility]) CM/SW Contact:  Kermit Balo, RN Phone Number: 10/13/2023, 11:02 AM   Clinical Narrative:     Pt is discharging to CIR today. CM signing off.    Final next level of care: IP Rehab Facility Barriers to Discharge: No Barriers Identified   Patient Goals and CMS Choice   CMS Medicare.gov Compare Post Acute Care list provided to:: Patient Choice offered to / list presented to : Patient      Discharge Placement                       Discharge Plan and Services Additional resources added to the After Visit Summary for     Discharge Planning Services: CM Consult Post Acute Care Choice: IP Rehab                               Social Drivers of Health (SDOH) Interventions SDOH Screenings   Food Insecurity: No Food Insecurity (10/06/2023)  Housing: Low Risk  (10/06/2023)  Transportation Needs: No Transportation Needs (10/06/2023)  Utilities: Not At Risk (10/06/2023)  Social Connections: Socially Integrated (10/07/2023)  Tobacco Use: High Risk (10/06/2023)     Readmission Risk Interventions     No data to display

## 2023-10-13 NOTE — Progress Notes (Signed)
 Upper extremity venous duplex completed. Please see CV Procedures for preliminary results.  Shona Simpson, RVT 10/13/23 4:20 PM

## 2023-10-13 NOTE — Plan of Care (Signed)
  Problem: Clinical Measurements: Goal: Ability to maintain clinical measurements within normal limits will improve Outcome: Progressing Goal: Will remain free from infection Outcome: Progressing Goal: Diagnostic test results will improve Outcome: Progressing Goal: Respiratory complications will improve Outcome: Progressing Goal: Cardiovascular complication will be avoided Outcome: Progressing   Problem: Health Behavior/Discharge Planning: Goal: Ability to manage health-related needs will improve Outcome: Progressing   Problem: Nutrition: Goal: Adequate nutrition will be maintained Outcome: Progressing   Problem: Activity: Goal: Risk for activity intolerance will decrease Outcome: Progressing   Problem: Safety: Goal: Ability to remain free from injury will improve Outcome: Progressing   Problem: Pain Managment: Goal: General experience of comfort will improve and/or be controlled Outcome: Progressing   Problem: Skin Integrity: Goal: Risk for impaired skin integrity will decrease Outcome: Progressing

## 2023-10-14 LAB — COMPREHENSIVE METABOLIC PANEL
ALT: 44 U/L (ref 0–44)
AST: 84 U/L — ABNORMAL HIGH (ref 15–41)
Albumin: 2.1 g/dL — ABNORMAL LOW (ref 3.5–5.0)
Alkaline Phosphatase: 93 U/L (ref 38–126)
Anion gap: 8 (ref 5–15)
BUN: 23 mg/dL (ref 8–23)
CO2: 24 mmol/L (ref 22–32)
Calcium: 9 mg/dL (ref 8.9–10.3)
Chloride: 103 mmol/L (ref 98–111)
Creatinine, Ser: 1.14 mg/dL (ref 0.61–1.24)
GFR, Estimated: >60 mL/min (ref >60)
Glucose, Bld: 138 mg/dL — ABNORMAL HIGH (ref 70–99)
Potassium: 3.8 mmol/L (ref 3.5–5.1)
Sodium: 135 mmol/L (ref 135–145)
Total Bilirubin: 0.6 mg/dL (ref 0.0–1.2)
Total Protein: 6.1 g/dL — ABNORMAL LOW (ref 6.5–8.1)

## 2023-10-14 LAB — CBC WITH DIFFERENTIAL/PLATELET
Abs Immature Granulocytes: 0.07 10*3/uL (ref 0.00–0.07)
Basophils Absolute: 0 10*3/uL (ref 0.0–0.1)
Basophils Relative: 0 %
Eosinophils Absolute: 0 10*3/uL (ref 0.0–0.5)
Eosinophils Relative: 0 %
HCT: 34.7 % — ABNORMAL LOW (ref 39.0–52.0)
Hemoglobin: 11.6 g/dL — ABNORMAL LOW (ref 13.0–17.0)
Immature Granulocytes: 1 %
Lymphocytes Relative: 14 %
Lymphs Abs: 1.3 10*3/uL (ref 0.7–4.0)
MCH: 29.7 pg (ref 26.0–34.0)
MCHC: 33.4 g/dL (ref 30.0–36.0)
MCV: 89 fL (ref 80.0–100.0)
Monocytes Absolute: 0.6 10*3/uL (ref 0.1–1.0)
Monocytes Relative: 7 %
Neutro Abs: 7.3 10*3/uL (ref 1.7–7.7)
Neutrophils Relative %: 78 %
Platelets: 154 10*3/uL (ref 150–400)
RBC: 3.9 MIL/uL — ABNORMAL LOW (ref 4.22–5.81)
RDW: 12.9 % (ref 11.5–15.5)
WBC: 9.3 10*3/uL (ref 4.0–10.5)
nRBC: 0 % (ref 0.0–0.2)

## 2023-10-14 LAB — RENAL FUNCTION PANEL
Albumin: 2.1 g/dL — ABNORMAL LOW (ref 3.5–5.0)
Anion gap: 9 (ref 5–15)
BUN: 24 mg/dL — ABNORMAL HIGH (ref 8–23)
CO2: 23 mmol/L (ref 22–32)
Calcium: 8.9 mg/dL (ref 8.9–10.3)
Chloride: 103 mmol/L (ref 98–111)
Creatinine, Ser: 1.11 mg/dL (ref 0.61–1.24)
GFR, Estimated: >60 mL/min (ref >60)
Glucose, Bld: 138 mg/dL — ABNORMAL HIGH (ref 70–99)
Phosphorus: 2.1 mg/dL — ABNORMAL LOW (ref 2.5–4.6)
Potassium: 3.8 mmol/L (ref 3.5–5.1)
Sodium: 135 mmol/L (ref 135–145)

## 2023-10-14 LAB — PREALBUMIN: Prealbumin: 13 mg/dL — ABNORMAL LOW (ref 18–38)

## 2023-10-14 LAB — GLUCOSE, CAPILLARY: Glucose-Capillary: 138 mg/dL — ABNORMAL HIGH (ref 70–99)

## 2023-10-14 MED ORDER — ATENOLOL 25 MG PO TABS
12.5000 mg | ORAL_TABLET | Freq: Every day | ORAL | Status: DC
  Administered 2023-10-15: 12.5 mg via ORAL
  Filled 2023-10-14: qty 0.5

## 2023-10-14 NOTE — Evaluation (Signed)
 Speech Language Pathology Assessment and Plan  Patient Details  Name: Juan Brewer MRN: [id number] Date of Birth: [birth year]  SLP Diagnosis: Cognitive Impairments;Dysphagia  Rehab Potential: Good ELOS: 3-3.5 weeks    Today's Date: 10/14/2023 SLP Individual Time: 3086-5784 SLP Individual Time Calculation (min): 54 min   Hospital Problem: Principal Problem:   Encephalopathy  Past Medical History:  Past Medical History:  Diagnosis Date   Arthritis    back   Hypertension    Stroke (cerebrum) [facility])    Past Surgical History:  Past Surgical History:  Procedure Laterality Date   CHOLECYSTECTOMY     LUMBAR DISC SURGERY  1971   VENTRICULOPERITONEAL SHUNT Right 12/25/2022   Procedure: RIGHT SHUNT INSERTION VENTRICULAR-PERITONEAL;  Surgeon: Tressie Stalker, MD;  Location: [facility] OR;  Service: Neurosurgery;  Laterality: Right;  RM 21PATIENT CANT MOVE UP    Assessment / Plan / Recommendation Clinical Impression  Pt is a 78 y/o male with PMH of CVA with residual R sided weakness, HTN, COPD, and 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  At [facility] neurology was consulted and stroke workup was essentially negative, except for MRI which they could not perform due to VPS, so he was transferred to [facility] on 3/11.  Neurology consulted and noted R weakness with slight drift.  Neurosurgery consulted for VPS management and reduced setting to 60 mmHg.  On 3/14 pt developed increase RUE pain, redness.  Pt underwent xray and CT of R shoulder which revealed severe glenohumeral joint OA with moderate glenohumeral effusion with intra-articular loose bodies and mild/moderate degeneration of the AC joint.  Labs also showing elevated ESR/CRP and in conjunction with low grade fevers ID was consulted.  Recommendations to continue to ancef until blood cultures are back.  IR aspirated GH joint on 3/17.   Therapy evaluations completed and pt was recommended for CIR due to functional decline.   Cognitive-Linguistic Evaluation: Patient exhibits significant deficits in the areas of orientation, registration, delayed recall, working memory, sustained and selective attention, initiation, awareness, problem solving, and visuospatial construction as evidenced by performance of 8/30 on SLUMS examination. 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 Patient benefited from total assist for clock problem, max assist for orientation to month and year, and max assist for problem solving throughout assessment. Benefits from max to total verbal cues to increase awareness of cognitive deficits. Patient would benefit from ST services to target aforementioned deficits. No   Swallowing: SLP conducted bedside swallow evaluation. Patient demonstrated prolonged mastication with regular solids but eventually achieved full oral clearance with utilization of liquid wash and when given extra time. No increased WOB noted during meals and patient seemed to consume majority of meal items despite prolongation of oral prep. Patient did demonstrate intermittent cough throughout meal but continued to do so for remainder of session in absence of bolus consumption, thus suspect cough is unrelated. Nevertheless, will plan to monitor swallow function in an ongoing capacity to insure clinical regular/thin liquid diet tolerance.   Patient was left in lowered bed with call bell in reach and bed alarm set. SLP will continue to target goals per plan of care.     Skilled Therapeutic Interventions  SLP conducted skilled evaluation to assess cognitive-linguistic and swallowing function utilizing patient interview, bedside swallow evaluation, and SLUMS  examination. Full results above.    SLP Assessment  Patient will need skilled Speech Lanaguage Pathology Services during CIR admission    Recommendations  SLP Diet Recommendations: Thin;Age appropriate regular solids Liquid Administration via: Cup;Straw Medication Administration: Whole meds with liquid Supervision: Patient able to self feed Compensations: Minimize environmental distractions Postural Changes and/or Swallow Maneuvers: Seated upright 90 degrees Oral Care Recommendations: Oral care BID Recommendations for Other Services: Neuropsych consult Patient destination: Home Follow up Recommendations: Home Health SLP;24 hour supervision/assistance Equipment Recommended: None recommended by SLP    SLP Frequency 3 to 5 out of 7 days   SLP Duration  SLP Intensity  SLP Treatment/Interventions 3-3.5 weeks  Minumum of 1-2 x/day, 30 to 90 minutes  Dysphagia/aspiration precaution training;Patient/family education;Cognitive remediation/compensation;Environmental controls;Cueing hierarchy;Functional tasks;Therapeutic Activities    Pain Pain Assessment Pain Scale: 0-10 Pain Score: 0-No pain Pain Location: Back  Prior Functioning Cognitive/Linguistic Baseline: Within functional limits Type of Home: House  Lives With: Spouse Available Help at Discharge: Family;Available PRN/intermittently Vocation: Part time employment  SLP Evaluation Cognition Overall Cognitive Status: Impaired/Different from baseline Arousal/Alertness: Awake/alert Orientation Level: Oriented to person;Oriented to place;Oriented to situation Year: 2016 Month:  (unable to state) Day of Week: Correct Attention: Sustained;Selective Sustained Attention: Impaired Sustained Attention Impairment: Verbal basic;Functional basic Selective Attention: Impaired Selective Attention Impairment: Verbal basic;Functional basic Memory: Impaired Memory Impairment: Storage deficit;Retrieval deficit;Decreased recall of new  information Awareness: Impaired Awareness Impairment: Intellectual impairment;Emergent impairment;Anticipatory impairment Problem Solving: Impaired Problem Solving Impairment: Verbal basic;Functional basic Executive Function: Organizing;Self Monitoring;Self Correcting Organizing: Impaired Organizing Impairment: Functional basic Self Monitoring: Impaired Self Monitoring Impairment: Functional basic Self Correcting: Impaired Self Correcting Impairment: Functional basic Safety/Judgment: Impaired  Comprehension Auditory Comprehension Overall Auditory Comprehension: Appears within functional limits for tasks assessed Expression Expression Primary Mode of Expression: Verbal Verbal Expression Overall Verbal Expression: Appears within functional limits for tasks assessed Oral Motor Oral Motor/Sensory Function Overall Oral Motor/Sensory Function: Within functional limits Motor Speech Overall Motor Speech: Appears within functional limits for tasks assessed Intelligibility: Intelligible  Care Tool Care Tool Cognition Ability to hear (with hearing aid or hearing appliances if normally used Ability to hear (with hearing aid or hearing appliances if normally used): 0. Adequate - no difficulty in normal conservation, social interaction, listening to TV   Expression of Ideas and Wants Expression of Ideas and Wants: 4. Without difficulty (complex and basic) - expresses complex messages without difficulty and with speech that is clear and easy to understand   Understanding Verbal and Non-Verbal Content Understanding Verbal and Non-Verbal Content: 4. Understands (complex and basic) - clear comprehension without cues or repetitions  Memory/Recall Ability Memory/Recall Ability : That he or she is in a hospital/hospital unit   Intelligibility: Intelligible  Bedside Swallowing Assessment General Previous Swallow Assessment: n/a Diet Prior to this Study: Regular;Thin liquids (Level 0) Temperature  Spikes Noted: No Respiratory Status: Room air History of Recent Intubation: No Behavior/Cognition: Alert;Cooperative;Pleasant mood Oral Cavity - Dentition: Adequate natural dentition Self-Feeding Abilities: Able to feed self;Needs set up Patient Positioning: Upright in bed Baseline Vocal Quality: Normal Volitional Cough: Strong Volitional Swallow: Able to elicit  Oral Care Assessment   Ice Chips Ice chips: Not tested Thin Liquid Thin Liquid: Within functional limits Presentation: Straw;Self Fed Nectar Thick Nectar Thick Liquid: Not tested Honey Thick Honey Thick Liquid: Not tested Puree Puree: Not tested Solid Solid: Impaired Presentation: Self Fed Oral Phase Impairments:  Impaired mastication Oral Phase Functional Implications: Impaired mastication;Prolonged oral transit;Oral residue Other Comments: independently utilized liquid wash to clear BSE Assessment Risk for Aspiration Impact on safety and function: Mild aspiration risk Other Related Risk Factors: Cognitive impairment  Short Term Goals: Week 1: SLP Short Term Goal 1 (Week 1): Patient will orient to date and time utilizing available external aids given mod verbal cues. SLP Short Term Goal 2 (Week 1): Patient will recall daily events with 75% accuracy given mod verbal cues. SLP Short Term Goal 3 (Week 1): Patient will demonstrate intellectual awareness of deficits by stating 2 cognitive and 2 physical deficits given mod verbal cues. SLP Short Term Goal 4 (Week 1): Patient will attend to functional therapy tasks for 15 minutes given mod verbal cues. SLP Short Term Goal 5 (Week 1): Patient will solve basic functional problems with 75% accuracy given mod verbal cues. SLP Short Term Goal 6 (Week 1): Patient will tolerate regular/thin liquid diet during clinical diet tolerance assessments without overt s/sx concerning for aspiration.  Refer to Care Plan for Long Term Goals  Recommendations for other services:  Neuropsych  Discharge Criteria: Patient will be discharged from SLP if patient refuses treatment 3 consecutive times without medical reason, if treatment goals not met, if there is a change in medical status, if patient makes no progress towards goals or if patient is discharged from hospital.  The above assessment, treatment plan, treatment alternatives and goals were discussed and mutually agreed upon: by patient  Jeannie Done, M.A., CCC-SLP  Garey Ham Tristine Langi 10/14/2023, 10:00 AM

## 2023-10-14 NOTE — Progress Notes (Signed)
 Patient ID: Juan Brewer, male   DOB: 1946/06/29, 78 y.o.   MRN: [id number] Met with the patient to review current medical situation, rehab process, team conference and plan of care. Patient noted he is having shoulder pain and back pain; MD aware. Aquathermia added.  Discussed mediations for secondary risk; patient reported wife managed meds PTA.  Reviewed incontinence; using a urinal and aware of need to have a bowel movement. Continue to follow along to address educational needs to facilitate preparation for discharge. Pamelia Hoit

## 2023-10-14 NOTE — Progress Notes (Signed)
 PROGRESS NOTE   Subjective/Complaints: C/o low back pain: height of bed readjusted and kpad ordered US reviewed and is negative for clots Vitals reviewed and are stable  ROS: +low back pain  Objective:   VAS [place] UPPER EXTREMITY VENOUS DUPLEX Result Date: 10/13/2023 UPPER VENOUS STUDY  Patient Name:  Juan Brewer  Date of Exam:   10/13/2023 Medical Rec #: [id number]           Accession #:    [id number] Date of Birth: Jun 03, 1946            Patient Gender: M Patient Age:   78 years Exam Location:  [facility] Procedure:      VAS [place] UPPER EXTREMITY VENOUS DUPLEX Referring Phys: RIPUDEEP RAI --------------------------------------------------------------------------------  Indications: Swelling, and Redness Risk Factors: None identified. Comparison Study: None. Performing Technologist: Shona Simpson  Examination Guidelines: A complete evaluation includes B-mode imaging, spectral Doppler, color Doppler, and power Doppler as needed of all accessible portions of each vessel. Bilateral testing is considered an integral part of a complete examination. Limited examinations for reoccurring indications may be performed as noted.  Right Findings: +----------+------------+---------+-----------+----------+-------+ RIGHT     CompressiblePhasicitySpontaneousPropertiesSummary +----------+------------+---------+-----------+----------+-------+ IJV           Full       Yes       Yes                      +----------+------------+---------+-----------+----------+-------+ Subclavian    Full       Yes       Yes                      +----------+------------+---------+-----------+----------+-------+ Axillary      Full       Yes       Yes                      +----------+------------+---------+-----------+----------+-------+ Brachial      Full       Yes       Yes                       +----------+------------+---------+-----------+----------+-------+ Radial        Full                 Yes                      +----------+------------+---------+-----------+----------+-------+ Ulnar         Full                 Yes                      +----------+------------+---------+-----------+----------+-------+ Cephalic      Full                 Yes                      +----------+------------+---------+-----------+----------+-------+ Basilic       Full  Yes                      +----------+------------+---------+-----------+----------+-------+  Left Findings: +----------+------------+---------+-----------+----------+-------+ LEFT      CompressiblePhasicitySpontaneousPropertiesSummary +----------+------------+---------+-----------+----------+-------+ Subclavian    Full       Yes       Yes                      +----------+------------+---------+-----------+----------+-------+  Summary:  Right: No evidence of deep vein thrombosis in the upper extremity. No evidence of superficial vein thrombosis in the upper extremity.  Left: No evidence of thrombosis in the subclavian.  *See table(s) above for measurements and observations.     Preliminary    CT HEAD WO CONTRAST ( ) Result Date: 10/12/2023 CLINICAL DATA:  Initial evaluation for mental status change, unknown cause. EXAM: CT HEAD WITHOUT CONTRAST TECHNIQUE: Contiguous axial images were obtained from the base of the skull through the vertex without intravenous contrast. RADIATION DOSE REDUCTION: This exam was performed according to the departmental dose-optimization program which includes automated exposure control, adjustment of the mA and/or kV according to patient size and/or use of iterative reconstruction technique. COMPARISON:  Prior study from 10/07/2023. FINDINGS: Brain: Mild age-related cerebral atrophy. Patchy and confluent hypodensity involving the supratentorial cerebral white matter,  consistent with chronic small vessel ischemic disease, moderately advanced in nature. No acute intracranial hemorrhage. No acute large vessel territory infarct. No mass lesion or midline shift. Right occipital approach VP shunt catheter in place with tip terminating at the right splenium. Stable ventricular size and morphology without hydrocephalus. No extra-axial fluid collection. Vascular: No abnormal hyperdense vessel. Scattered vascular calcifications noted within the carotid siphons. Skull: Scalp soft tissues demonstrate no acute finding. Calvarium intact. Sinuses/Orbits: Globes and orbital soft tissues within normal limits. Chronic sphenoethmoidal and maxillary sinus disease noted. Trace right mastoid effusion noted, of doubtful significance. Other: None. IMPRESSION: 1. No acute intracranial abnormality. 2. Right occipital approach VP shunt catheter in place with tip terminating at the right splenium. Stable ventricular size and morphology without hydrocephalus. 3. 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 Electronically Signed   By: Rise Mu M.D.   On: 10/12/2023 23:03   DG FL ASP/INJ MAJOR (SHOULDER, HIP, KNEE) Result Date: 10/12/2023 CLINICAL DATA:  Patient is a 78 y/o male with right shoulder pain. Patient presents for a diagnostic right shoulder aspiration due to pain and a right glenohumeral joint effusion on CT scan. EXAM: Right  SHOULDER aspiration UNDER FLUOROSCOPY TECHNIQUE: An appropriate skin entrance site was determined. The site was marked, prepped with Betadine, draped in the usual sterile fashion, and infiltrated locally with buffered Lidocaine. 2 mL lidocaine injection. A 18 gauge spinal needle was advanced to the inferior medial margin of the humeral head under intermittent fluoroscopy. 1mL of fluid was aspirated from the joint space. There was no immediate complication. FLUOROSCOPY: Radiation Exposure Index (as provided by the  fluoroscopic device): 1.20 mGy Kerma FINDINGS: 1 mL of cloudy yellow fluid obtained from the right glenohumeral joint. IMPRESSION: Technically successful right shoulder aspiration for culture obtained. Performed by Philipp Ovens PA-C Electronically Signed   By: Corlis Leak M.D.   On: 10/12/2023 17:55   Recent Labs    10/13/23 0508 10/14/23 0518  WBC 10.4 9.3  HGB 11.5* 11.6*  HCT 34.3* 34.7*  PLT 143* 154   Recent Labs    10/14/23 0518 10/14/23 0519  NA 135 135  K 3.8 3.8  CL  103 103  CO2 24 23  GLUCOSE 138* 138*  BUN 23 24*  CREATININE 1.14 1.11  CALCIUM 9.0 8.9    Intake/Output Summary (Last 24 hours) at 10/14/2023 1050 Last data filed at 10/14/2023 0400 Gross per 24 hour  Intake 240 ml  Output 650 ml  Net -410 ml     Pressure Injury 10/06/23 Buttocks Right;Left;Mid Stage 1 -  Intact skin with non-blanchable redness of a localized area usually over a bony prominence. (Active)  10/06/23 2000  Location: Buttocks  Location Orientation: Right;Left;Mid  Staging: Stage 1 -  Intact skin with non-blanchable redness of a localized area usually over a bony prominence.  Wound Description (Comments):   Present on Admission: Yes    Physical Exam: Vital Signs Blood pressure 118/73, pulse 61, temperature 98.3 F (36.8 C), temperature source Oral, resp. rate 16, height 5\' 11"  (1.803 m), weight 75.6 kg, SpO2 96%. Gen: no distress, normal appearing HEENT: oral mucosa pink and moist, NCAT Cardio: Reg rate Chest: normal effort, normal rate of breathing Abd: soft, non-distended Ext: no edema Psych: pleasant, normal affect Genitourinary:    Comments: Has condom cath in place draining amber colored urine Musculoskeletal:     Cervical back: Normal range of motion.     Right lower leg: No edema.     Left lower leg: No edema.     Comments: Mild right hand forearm edema. Right shoulder tender with minimal flexion, IR/ER. Mild shoulder tenderness to touch. Low back tender with bed  mobility, flexion/rotation.   Skin:    Comments: Diffuse bruising/ecchymoses as well as skin lacs and abrasions of various stages on all 4 limbs. Right forearm with mild erythema, no warmth--doesn't look a whole lot different than left forearm. A few abrasions dressed.  Neurological:     Mental Status: He is alert and oriented to person, place, and time.     Comments: Pt is alert and oriented to person, place, month, year. Thought it was the 18th. CN exam non-focal. Fair insight and awareness. Able to provide some biographical information. Focus improved. MMT: RUE limited by shoulder pain 2-3/5 prox to 4/5 distally. LUE grossly 4- to 4+/5. RLE and LLE-- 3/5 HF, 3+ KE with pain compontnents. 4+/5 ADF/PF. Found no consistent sensory loss. No cerebellar signs or abnormal reflexes.   Psychiatric:        Mood and Affect: Mood normal.        Behavior: Behavior normal.     Comments: Pt is pleasant and cooperative       Assessment/Plan: 1. Functional deficits which require 3+ hours per day of interdisciplinary therapy in a comprehensive inpatient rehab setting. Physiatrist is providing close team supervision and 24 hour management of active medical problems listed below. Physiatrist and rehab team continue to assess barriers to discharge/monitor patient progress toward functional and medical goals  Care Tool:  Bathing              Bathing assist       Upper Body Dressing/Undressing Upper body dressing        Upper body assist      Lower Body Dressing/Undressing Lower body dressing            Lower body assist       Toileting Toileting    Toileting assist       Transfers Chair/bed transfer  Transfers assist           Locomotion Ambulation   Ambulation assist  Walk 10 feet activity   Assist           Walk 50 feet activity   Assist           Walk 150 feet activity   Assist           Walk 10 feet on uneven surface   activity   Assist           Wheelchair     Assist               Wheelchair 50 feet with 2 turns activity    Assist            Wheelchair 150 feet activity     Assist          Blood pressure 118/73, pulse 61, temperature 98.3 F (36.8 C), temperature source Oral, resp. rate 16, height 5\' 11"  (1.803 m), weight 75.6 kg, SpO2 96%.  Medical Problem List and Plan: 1. Functional deficits secondary to acute encephalopathy d/t NPH             -patient may shower             -ELOS/Goals: 21-24 days, supervision to min assist with PT, OT, SLP  Grounds pass ordered   2.  Antithrombotics: -DVT/anticoagulation:  Pharmaceutical: continue Lovenox             -antiplatelet therapy: Plavix    3. Low back pain: kpad ordered. Tylenol as needed   4.Anxiety: continue Xanax 0.5 mg every 6 hours as needed             -continue triazolam 0.25 mg daily at bedtime             -continue melatonin 3 mg daily at bedtime             -continue trazodone 50 mg daily at bedtime             -antipsychotic agents: n/a             -keep sleep chart 5. Neuropsych/cognition: This patient is  capable of making decisions on his own behalf.   6. Skin/Wound Care: Routine skin care checks             -local care as needed to multiple lacs/abrasions/bruises 7. 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 Lovell Sheehan)             -follows with [facility] (11/26/2023)   11: COPD exacerbation: continue Breo and Incruse Ellipta, Tessalon   12: UTI: mixed flora              -completed course of cefuroxime - end 3/18   13: History of parapneumonic effusion,  s/p LUL biopsy             -follows with Dr. Dorris Fetch and plan surveillance CT in 2-3 months   14: Tobacco use: Continue NicoDerm 21 mg   15: AKI atop ? CKD: BUN/Cr currently WNL             -  follow-up BMP   16: Thrombocytopenia: improving -Follow-up CBC.   17: Right shoulder pain with glenohumeral joint effusion s/p aspiration 1mL joint fluid             - jt fluid with 1800 wbc's and neutrophils 50%, and calcium pyrophosphate crystals; gram stain with rare wbc's, cx pending. 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             -will try voltaren gel initially             -consider steroid injection depending upon course             -?outpt f/u with shoulder specialist.   18: Right forearm cellulitis: fever resolved, no leukocytosis -now on cefafroxil 500 mg BID for 10 doses (thru 3/23)     LOS: 1 days A FACE TO FACE EVALUATION WAS PERFORMED  Juan Brewer 10/14/2023, 10:50 AM

## 2023-10-14 NOTE — Progress Notes (Signed)
 Inpatient Rehabilitation Admission Medication Review by a Pharmacist  A complete drug regimen review was completed for this patient to identify any potential clinically significant medication issues.     High Risk Drug Classes Is patient taking? Indication by Medication  Antipsychotic No    Anticoagulant Yes Lovenox - VTE ppx  Antibiotic Yes  Po Cefadroxil x 5 days COPD/UTI  Opioid No    Antiplatelet Yes Clopidogrel - CVA  Hypoglycemics/insulin No    Vasoactive Medication Yes Atenolol - HTN  Chemotherapy No    Other Yes  Acetaminophen, diclofenac gel- pain management Ondansetron prn N/V Alprazolam prn anxiety Benzonatate - cough Breo/Incruse - COPD Duo-neb prn SOB Lorazepam- x1 prn MRI Melatonin- sleep Miralax, senokot S, fleet enema: constipation Nicotine patch- tobacco use cessation Simvastatin - HLD Trazodone, triazolam - sleep         Type of Medication Issue Identified Description of Issue Recommendation(s)  Drug Interaction(s) (clinically significant)        Duplicate Therapy        Allergy        No Medication Administration End Date        Incorrect Dose        Additional Drug Therapy Needed        Significant med changes from prior encounter (inform family/care partners about these prior to discharge).  PTA medication:  Losartan was discontinued on acute care discharge.   Restart PTA meds when and if necessary during CIR admission or at time of discharge, if warranted.  Communicate to patient /family/ caregiver prior to discharge.    Other            Clinically significant medication issues were identified that warrant physician communication and completion of prescribed/recommended actions by midnight of the next day:  No   Name of provider notified for urgent issues identified:    Provider Method of Notification:        Pharmacist comments: None   Time spent performing this drug regimen review (minutes):  20 mintues    Noah Delaine,  RPh Clinical Pharmacist 10/14/2023 8:59 AM

## 2023-10-14 NOTE — Patient Care Conference (Signed)
 Inpatient RehabilitationTeam Conference and Plan of Care Update Date: 10/14/2023   Time: 11:25 AM    Patient Name: Juan Brewer      Medical Record Number: [id number]  Date of Birth: Dec 09, 1945 Sex: Male         Room/Bed: 4M11C/4M11C-01 Payor Info: Payor: MEDICARE / Plan: MEDICARE PART A AND B / Product Type: *No Product type* /    Admit Date/Time:  10/13/2023  1:24 PM  Primary Diagnosis:  Encephalopathy  Hospital Problems: Principal Problem:   Encephalopathy    Expected Discharge Date: Expected Discharge Date:  (evals pending)  Team Members Present: Physician leading conference: Dr. Sula Soda Social Worker Present: Dossie Der, LCSW Nurse Present: Chana Bode, RN PT Present: Wynelle Link, PT OT Present: Roney Mans, OT SLP Present: Jeannie Done, SLP PPS Coordinator present : Fae Pippin, SLP     Current Status/Progress Goal Weekly Team Focus  Bowel/Bladder      Incontinent of bowel and bladder    Bowel and bladder managed with min assist    Toileting protocol  Swallow/Nutrition/ Hydration   eval pending           ADL's   eval pending            Mobility   PT EVAL PENDING           Communication   eval pending            Safety/Cognition/ Behavioral Observations  eval pending            Pain      Back pain and shoulder pain    Pain < 4 with prns    Aquathermia added for pain control. Prn medications on board  Skin      Stage 1 to buttocks   Skin healing    Skin assessment q shift, off loading/pressure relief measures in place, foam on buttocks    Discharge Planning:  New evaluation-home with wife who does work a few days per week. 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  Patient on target to meet rehab goals: Evals pending   *See Care Plan and  progress notes for long and short-term goals.   Revisions to Treatment Plan:  Nicotine patch for smoking cessation Voltaren gel for back pain/aquathermia Breo for cough with tessalon pearls   Teaching Needs: Safety, medications, transfers, toileting, etc.   Current Barriers to Discharge: Decreased caregiver support, Home enviroment access/layout, and Incontinence  Possible Resolutions to Barriers: Family education     Medical Summary Current Status: low back pain, polypharmacy, cough, history of CVA, insomnia  Barriers to Discharge: Medical stability  Barriers to Discharge Comments: low back pain, polypharamcy, cough, history of CVA, insomnia Possible Resolutions to [employer] Focus: kpad ordered, decrease atenolol to 12.5mg  daily, continue tessalon, conitnue plavix/lovenox, continue trazodone, d/c miralax, continue statin   Continued Need for Acute Rehabilitation Level of Care: The patient requires daily medical management by a physician with specialized training in physical medicine and rehabilitation for the following reasons: Direction of a multidisciplinary physical rehabilitation program to maximize functional independence : Yes Medical management of patient stability for increased activity during participation in an intensive rehabilitation regime.: Yes Analysis of laboratory values and/or radiology reports with any subsequent need for medication adjustment and/or medical intervention. : Yes  I attest that I was present, lead the team conference, and concur with the assessment and plan of the team.   Chana Bode B 10/14/2023, 3:20 PM

## 2023-10-14 NOTE — Plan of Care (Signed)
  Problem: RH Swallowing Goal: LTG Patient will consume least restrictive diet using compensatory strategies with assistance (SLP) Description: LTG:  Patient will consume least restrictive diet using compensatory strategies with assistance (SLP) Flowsheets (Taken 10/14/2023 0951) LTG: Pt Patient will consume least restrictive diet using compensatory strategies with assistance of (SLP): Modified Independent   Problem: RH Cognition - SLP Goal: RH LTG Patient will demonstrate orientation with cues Description:  LTG:  Patient will demonstrate orientation to person/place/time/situation with cues (SLP)   Flowsheets (Taken 10/14/2023 0951) LTG Patient will demonstrate orientation to:  Person  Place  Time  Situation LTG: Patient will demonstrate orientation using cueing (SLP): Minimal Assistance - Patient > 75%   Problem: RH Problem Solving Goal: LTG Patient will demonstrate problem solving for (SLP) Description: LTG:  Patient will demonstrate problem solving for basic/complex daily situations with cues  (SLP) Flowsheets (Taken 10/14/2023 0951) LTG: Patient will demonstrate problem solving for (SLP): Basic daily situations LTG Patient will demonstrate problem solving for: Minimal Assistance - Patient > 75%   Problem: RH Memory Goal: LTG Patient will demonstrate ability for day to day (SLP) Description: LTG:   Patient will demonstrate ability for day to day recall/carryover during cognitive/linguistic activities with assist  (SLP) Flowsheets (Taken 10/14/2023 0951) LTG: Patient will demonstrate ability for day to day recall: New information LTG: Patient will demonstrate ability for day to day recall/carryover during cognitive/linguistic activities with assist (SLP): Minimal Assistance - Patient > 75%   Problem: RH Attention Goal: LTG Patient will demonstrate this level of attention during functional activites (SLP) Description: LTG:  Patient will will demonstrate this level of attention during  functional activites (SLP) Flowsheets (Taken 10/14/2023 [phone]) Patient will demonstrate during cognitive/linguistic activities the attention type of: Sustained Patient will demonstrate this level of attention during cognitive/linguistic activities in: Controlled LTG: Patient will demonstrate this level of attention during cognitive/linguistic activities with assistance of (SLP): Minimal Assistance - Patient > 75% Number of minutes patient will demonstrate attention during cognitive/linguistic activities: 20   Problem: RH Awareness Goal: LTG: Patient will demonstrate awareness during functional activites type of (SLP) Description: LTG: Patient will demonstrate awareness during functional activites type of (SLP) Flowsheets (Taken 10/14/2023 0951) Patient will demonstrate during cognitive/linguistic activities awareness type of: Intellectual LTG: Patient will demonstrate awareness during cognitive/linguistic activities with assistance of (SLP): Minimal Assistance - Patient > 75%

## 2023-10-14 NOTE — Progress Notes (Signed)
 Awaiting kpad to be delievered   Tilden Dome, LPN

## 2023-10-14 NOTE — Evaluation (Signed)
 Occupational Therapy Assessment and Plan  Patient Details  Name: Juan Brewer MRN: [id number] Date of Birth: [birth year]  OT Diagnosis: cognitive deficits, hemiplegia affecting dominant side, and muscle weakness (generalized) Rehab Potential: Rehab Potential (ACUTE ONLY): Fair ELOS: 3 weeks   Today's Date: 10/14/2023 OT Individual Time: 1305-1405 OT Individual Time Calculation (min): 60 min     Hospital Problem: Principal Problem:   Encephalopathy   Past Medical History:  Past Medical History:  Diagnosis Date   Arthritis    back   Hypertension    Stroke (cerebrum) [facility])    Past Surgical History:  Past Surgical History:  Procedure Laterality Date   CHOLECYSTECTOMY     LUMBAR DISC SURGERY  1971   VENTRICULOPERITONEAL SHUNT Right 12/25/2022   Procedure: RIGHT SHUNT INSERTION VENTRICULAR-PERITONEAL;  Surgeon: Tressie Stalker, MD;  Location: [facility] OR;  Service: Neurosurgery;  Laterality: Right;  RM 21PATIENT CANT MOVE UP    Assessment & Plan Clinical Impression: Juan, Brewer. 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 Lovell Brewer in May 2024.  He also has a history of prior CVA with right-sided weakness and presented to [facility] on 10/04/2023 with increased weakness and confusion.  He was admitted there and neurology consulted.  Patient complained of increased right lower extremity weakness greater than baseline.  Stroke swallow evaluation was obtained and he was allowed p.o. intake.  CT head without acute findings, presence of ventriculostomy with normal ventricular size.  CTA of head and neck performed with no emergent large vessel occlusion or hemodynamically significant stenosis.  He also underwent echocardiogram with normal ejection fraction, no thrombus or PFO.  His hemoglobin A1c is 6%.  LDL 55.  Neurology recommended MRI of the brain without contrast but this could not be  performed due to the presence of the patient's programmable VP shunt.  He was transferred to [facility] for consultation with Dr. Lovell Brewer.  On arrival, noted to have significant cough by Dr. Amada Brewer which had worsened over the past week.  He has suspicion is that patient likely had an acute Visiol stressor such as bronchitis, URI, etc.  Factious workup carried out.  Found to have urinary tract infection and started on Rocephin.  Culture grew mixed flora.  Viral panel negative.  Started on bronchodilators and Tessalon Perles.  Home Plavix and statin continued.  He has significant history of anxiety and insomnia and treated with triazolam and Xanax.  Started on melatonin for sleep.  He has a history of tobacco abuse 1 pack/day and NicoDerm patches placed.  Pressure injury noted of the right left mid buttock.  MRI completed without acute intracranial abnormality.  His VP shunt was reprogrammed by neurosurgery after the MRI.  Developed acute onset of right shoulder pain on 3/16 with erythema and edema of right forearm and hand.  + fever. 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 Blood cultures obtained and Ancef IV started. Orthopedic surgery and ID consulted and joint aspiration performed. Fluid aspirate suggests pseudogout. Held off on ESI due to fever. Now on Duricef 500 mg BID thru 3/23 per ID recs..   Physical therapy note yesterday, the patient required mod-max assist for bed mobility today and max support initially for seated balance with heavy Rt and posterior lean as he fatigued. Attempted sit<>stand with Stedy 3x from elevated EOB, pt returned to supine due to  fatigue. Cues for rolling Rt/Lt to place maxisky pad and pt transfer bed>chair dependently via lift. The patient requires inpatient medicine and rehabilitation evaluations and services for ongoing dysfunction secondary to encephalopathy.Patient transferred to CIR on 10/13/2023 .    Patient currently  requires total with basic self-care skills secondary to muscle weakness, decreased cardiorespiratoy endurance, decreased coordination and decreased motor planning, decreased attention to right, decreased initiation, decreased attention, decreased awareness, decreased problem solving, decreased safety awareness, decreased memory, and delayed processing, and decreased sitting balance, decreased standing balance, decreased postural control, hemiplegia, and decreased balance strategies.  Prior to hospitalization, patient could complete ADLs with modified independent .  Patient will benefit from skilled intervention to increase independence with basic self-care skills prior to discharge home with care partner.  Anticipate patient will require 24 hour supervision and minimal physical assistance and follow up home health.  OT - End of Session Activity Tolerance: Tolerates 10 - 20 min activity with multiple rests Endurance Deficit: Yes Endurance Deficit Description: generalized weakness OT Assessment Rehab Potential (ACUTE ONLY): Fair OT Patient demonstrates impairments in the following area(s): Balance;Safety;Cognition;Behavior;Perception;Sensory;Edema;Skin Integrity;Endurance;Vision;Motor;Pain OT Basic ADL's Functional Problem(s): Bathing;Dressing;Toileting;Grooming OT Transfers Functional Problem(s): Toilet;Tub/Shower OT Additional Impairment(s): None OT Plan OT Intensity: Minimum of 1-2 x/day, 45 to 90 minutes OT Frequency: 5 out of 7 days OT Duration/Estimated Length of Stay: 3 weeks OT Treatment/Interventions: Balance/vestibular training;Discharge planning;Pain management;Self Care/advanced ADL retraining;Therapeutic Activities;UE/LE Coordination activities;Therapeutic Exercise;Visual/perceptual remediation/compensation;Skin care/wound managment;Patient/family education;Functional mobility training;Cognitive remediation/compensation;Community reintegration;Wheelchair propulsion/positioning;UE/LE  Strength taining/ROM;Psychosocial support;Neuromuscular re-education;DME/adaptive equipment instruction OT Self Feeding Anticipated Outcome(s): (S) OT Basic Self-Care Anticipated Outcome(s): min A OT Toileting Anticipated Outcome(s): min A OT Bathroom Transfers Anticipated Outcome(s): min A OT Recommendation Patient destination: Home Follow Up Recommendations: Home health OT Equipment Recommended: To be determined   OT Evaluation Precautions/Restrictions  Precautions Precautions: Fall Precaution/Restrictions Comments: Fear of Falling Restrictions Weight Bearing Restrictions Per Provider Order: No   Home Living/Prior Functioning Home Living Living Arrangements: Spouse/significant other Available Help at Discharge: Family, Available PRN/intermittently Type of Home: House Home Access: Stairs to enter Secretary/administrator of Steps: 1 Entrance Stairs-Rails: Right Home Layout: Able to live on main level with bedroom/bathroom, Two level Alternate Level Stairs-Number of Steps: pt reports he does not need to go upstairs Bathroom Shower/Tub: Engineer, manufacturing systems: Handicapped height Additional Comments: Spouse notes that pt has had some falls, pt denied having falls  Lives With: Spouse IADL History Homemaking Responsibilities: Yes Meal Prep Responsibility: Secondary Laundry Responsibility: Secondary Cleaning Responsibility: Secondary Bill Paying/Finance Responsibility: Secondary Current License: Yes Occupation: Part time employment Type of Occupation: Water engineer estate Prior Function Level of Independence: Independent with basic ADLs, Independent with transfers, Independent with gait  Able to Take Stairs?: Yes Driving: No Vocation: Part time employment Vocation Requirements: Owns Real estate buisness Vision Baseline Vision/History: 1 Wears glasses (reading) Ability to See in Adequate Light: 1 Impaired Additional Comments: diffiulting with head   turning to the  right but able to turn eyes to the right Perception  Perception: Impaired Perception-Other Comments: right side / PTA with previous stroke Praxis Praxis: Impaired Praxis Impairment Details: Motor planning;Initiation Praxis-Other Comments: fear of movement Cognition Cognition Overall Cognitive Status: Within Functional Limits for tasks assessed Arousal/Alertness: Awake/alert Orientation Level: Person;Place;Situation Person: Oriented Place: Oriented Situation: Disoriented Memory: Impaired Memory Impairment: Storage deficit;Retrieval deficit;Decreased recall of new information Attention: Sustained;Selective Sustained Attention: Impaired Sustained Attention Impairment: Verbal basic;Functional basic Selective Attention: Impaired Selective Attention Impairment: Verbal basic;Functional basic Awareness: Impaired Awareness Impairment: Intellectual impairment Problem Solving: Impaired  Problem Solving Impairment: Verbal basic;Functional basic Executive Function: Organizing;Self Monitoring;Self Correcting Organizing: Impaired Organizing Impairment: Functional basic Self Monitoring: Impaired Self Monitoring Impairment: Functional basic Self Correcting: Impaired Self Correcting Impairment: Functional basic Safety/Judgment: Impaired Comments: all higher level of  cognition all impairement Brief Interview for Mental Status (BIMS) Repetition of Three Words (First Attempt): 3 Temporal Orientation: Year: Correct Temporal Orientation: Month: Accurate within 5 days Temporal Orientation: Day: Correct Recall: "Sock": No, could not recall Recall: "Blue": Yes, after cueing ("a color") Recall: "Bed": No, could not recall BIMS Summary Score: 10 Sensation Sensation Light Touch: Impaired Detail Light Touch Impaired Details: Impaired RUE;Impaired RLE Hot/Cold: Not tested Proprioception: Appears Intact Stereognosis: Not tested Coordination Gross Motor Movements are Fluid and Coordinated:  No Fine Motor Movements are Fluid and Coordinated: Yes Coordination and Movement Description: Global weakness and deconditioning; fear of falling inhibiting movement and effort Motor  Motor Motor: Hemiplegia;Abnormal postural alignment and control Motor - Skilled Clinical Observations: 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  Trunk/Postural Assessment  Cervical Assessment Cervical Assessment: Exceptions to [facility] Thoracic Assessment Thoracic Assessment: Exceptions to [facility] Lumbar Assessment Lumbar Assessment: Exceptions to [facility] Postural Control Postural Control: Deficits on evaluation  Balance Balance Balance Assessed: Yes Static Sitting Balance Static Sitting - Balance Support: Feet supported;Bilateral upper extremity supported Static Sitting - Level of Assistance: 2: Max assist Dynamic Sitting Balance Dynamic Sitting - Balance Support: Feet supported;Bilateral upper extremity supported Dynamic Sitting - Level of Assistance: 2: Max assist Static Standing Balance Static Standing - Balance Support: During functional activity;Bilateral upper extremity supported Static Standing - Level of Assistance: 1: +2 Total assist Dynamic Standing Balance Dynamic Standing - Balance Support: During functional activity;Bilateral upper extremity supported Dynamic Standing - Level of Assistance: 1: +2 Total assist Extremity/Trunk Assessment RUE Assessment RUE Assessment: Exceptions to [facility] Active Range of Motion (AROM) Comments: 110 degrees of shoulder flexion RUE Body System: Neuro Brunstrum levels for arm and hand: Hand;Arm Brunstrum level for arm: Stage V Relative Independence from Synergy Brunstrum level for hand: Stage VI Isolated joint movements LUE Assessment LUE Assessment: Exceptions to [facility] General Strength Comments: 4/5  Care Tool Care Tool Self Care Eating   Eating Assist Level: Minimal Assistance - Patient > 75%    Oral Care    Oral Care Assist Level: Minimal Assistance - Patient > 75%     Bathing   Body parts bathed by patient: Right arm;Left arm;Chest;Abdomen;Front perineal area;Face Body parts bathed by helper: Buttocks;Right upper leg;Left upper leg;Right lower leg;Left lower leg   Assist Level: 2 Helpers    Upper Body Dressing(including orthotics)   What is the patient wearing?: Pull over shirt   Assist Level: Minimal Assistance - Patient > 75%    Lower Body Dressing (excluding footwear)     Assist for lower body dressing: 2 Helpers    Putting on/Taking off footwear   What is the patient wearing?: Non-skid slipper socks Assist for footwear: Dependent - Patient 0%       Care Tool Toileting Toileting activity   Assist for toileting: 2 Helpers     Care Tool Bed Mobility Roll left and right activity   Roll left and right assist level: Maximal Assistance - Patient 25 - 49%    Sit to lying activity   Sit to lying assist level: Maximal Assistance - Patient 25 - 49%    Lying to sitting on side of bed activity   Lying to sitting on side of bed assist level: the ability to move from lying  on the back to sitting on the side of the bed with no back support.: Maximal Assistance - Patient 25 - 49%     Care Tool Transfers Sit to stand transfer   Sit to stand assist level: 2 Helpers    Chair/bed transfer   Chair/bed transfer assist level: 2 Software engineer transfer   Assist Level: 2 Helpers     Care Tool Cognition  Expression of Ideas and Wants Expression of Ideas and Wants: 3. Some difficulty - exhibits some difficulty with expressing needs and ideas (e.g, some words or finishing thoughts) or speech is not clear  Understanding Verbal and Non-Verbal Content Understanding Verbal and Non-Verbal Content: 3. Usually understands - understands most conversations, but misses some part/intent of message. Requires cues at times to understand   Memory/Recall Ability     Refer to Care Plan for Long Term Goals  SHORT TERM GOAL WEEK 1 OT Short Term Goal 1 (Week  1): Pt will initiate movement to EOB with no more than mod cueing OT Short Term Goal 2 (Week 1): Pt will complete sit > stand with max A of 1 OT Short Term Goal 3 (Week 1): Pt will improve awareness of deficits by asking for assist during LB dressing OT Short Term Goal 4 (Week 1): Pt will attend to RUE during grooming task with mod cueing  Recommendations for other services: Neuropsych   Skilled Therapeutic Intervention ADL ADL Grooming: Minimal assistance Upper Body Bathing: Moderate assistance Where Assessed-Upper Body Bathing: Edge of bed Lower Body Bathing: Other (comment) (+2) Where Assessed-Lower Body Bathing: Edge of bed Upper Body Dressing: Minimal assistance Lower Body Dressing: Other (Comment) (+2  for  safety) Where Assessed-Lower Body Dressing: Sitting at sink Toileting: Dependent Where Assessed-Toileting: Teacher, adult education:  (+2 max A) Toilet Transfer Method: Surveyor, minerals: Bedside commode Mobility  Bed Mobility Bed Mobility: Sit to Supine;Supine to Sit Supine to Sit: Maximal Assistance - Patient - Patient 25-49% Sit to Supine: Maximal Assistance - Patient 25-49% Transfers Sit to Stand: 2 Helpers Stand to Sit: 2 Helpers  Skilled OT evaluation completed with the creation of pt centered OT POC. Pt educated on condition, ELOS, rehab expectations, and fall risk reduction strategies throughout session- little to no carryover anticipated. He was fully oriented but demonstrated very poor initiation, sequencing, motor planning, and awareness of deficits. He required max cueing to participate in session and was perseverative on getting back to bed. He was initially total A +2 for all movement, stand pivot and toilet transfer. Fear of falling heavily impacting mobility. Once he felt safer- sink anterior to hold onto for stand and use of RW with a man present he was more successful and requiring just max A +1-2. He was passed off to PT for their  evaluation.    Discharge Criteria: Patient will be discharged from OT if patient refuses treatment 3 consecutive times without medical reason, if treatment goals not met, if there is a change in medical status, if patient makes no progress towards goals or if patient is discharged from hospital.  The above assessment, treatment plan, treatment alternatives and goals were discussed and mutually agreed upon: by patient  Crissie Reese 10/14/2023, 3:22 PM

## 2023-10-14 NOTE — Progress Notes (Signed)
 Inpatient Rehabilitation Care Coordinator Assessment and Plan Patient Details  Name: Juan Brewer MRN: [id number] Date of Birth: 01-11-1946  Today's Date: 10/14/2023  Hospital Problems: Principal Problem:   Encephalopathy  Past Medical History:  Past Medical History:  Diagnosis Date   Arthritis    back   Hypertension    Stroke (cerebrum) [facility])    Past Surgical History:  Past Surgical History:  Procedure Laterality Date   CHOLECYSTECTOMY     LUMBAR DISC SURGERY  1971   VENTRICULOPERITONEAL SHUNT Right 12/25/2022   Procedure: RIGHT SHUNT INSERTION VENTRICULAR-PERITONEAL;  Surgeon: Tressie Stalker, MD;  Location: [facility] OR;  Service: Neurosurgery;  Laterality: Right;  RM 21PATIENT CANT MOVE UP   Social History:  reports that he has been smoking cigarettes. He has never used smokeless tobacco. He reports that he does not drink alcohol and does not use drugs.  Family / Support Systems Marital Status: Married Patient Roles: Spouse, Parent Spouse/Significant Other: Camelia Eng [phone] Children: Wanita Chamberlain [phone] Other Supports: Daughter in-law Anticipated Caregiver: Wife and son Ability/Limitations of Caregiver: Wife works for the [employer] in charge of summer camps does work 2-3 days per week. Aware pt may need 24/7 care at DC Caregiver Availability: Other (Comment) (Await and arrange care depending on care needs) Family Dynamics: Close knit with family-both son and his wife assist at times with Dad and wife is available. They have friends who are supportive also  Social History Preferred language: English Religion: Methodist Cultural Background: NA Education: Some Charity fundraiser - How often do you need to have someone help you when you read instructions, pamphlets, or other written material from your doctor or pharmacy?: Rarely Writes: Yes Employment Status: Retired Marine scientist Issues: NA Guardian/Conservator: None-according to MD pt is not fully capable of  making his own decisions at this time. Will look toward his wife for any decisions while here   Abuse/Neglect Abuse/Neglect Assessment Can Be Completed: Yes Physical Abuse: Denies Verbal Abuse: Denies Sexual Abuse: Denies Exploitation of patient/patient's resources: Denies Self-Neglect: Denies  Patient response to: Social Isolation - How often do you feel lonely or isolated from those around you?: Never  Emotional Status Pt's affect, behavior and adjustment status: Pt is motivated to do well and get back to his previous level. His wife reports he was doing well at home prior to this event. He just stopped moving ne day and they took him to the hospital for tests for shunt, infection, stroke, etc. Mod/i with rolling walker PTA Recent Psychosocial Issues: other health issues-previous stroke and deficits Psychiatric History: No history-issues would benefit from seeing neuro-psych for coping while here, may still be here upon his return form vacation Substance Abuse History: Tobacco not using nicotine patch  Patient / Family Perceptions, Expectations & Goals Pt/Family understanding of illness & functional limitations: Pt and wife can explain his issues, but wife is still confused as to what has happened to him after all of the other issues have been ruled out. Both are hopeful he will do well and improve while here Premorbid pt/family roles/activities: husband, father, retiree, friend, neighbor, etc Anticipated changes in roles/activities/participation: resume Pt/family expectations/goals: Pt states: " I hope to do well and get back on my walker."  Wife states: " I hope he can be mobile and back to what he did before this episode."  [employer]: Other (Comment) (has HH and OP) Premorbid Home Care/DME Agencies: Other (Comment) (tub seat, rollator, cane) Transportation available at discharge: wife and family  Is the patient able to respond to transportation needs?:  Yes In the past 12 months, has lack of transportation kept you from medical appointments or from getting medications?: No In the past 12 months, has lack of transportation kept you from meetings, work, or from getting things needed for daily living?: No Resource referrals recommended: Neuropsychology  Discharge Planning Living Arrangements: Spouse/significant other Support Systems: Spouse/significant other, Children, Other relatives, Friends/neighbors Type of Residence: Private residence Insurance Resources: [employer] Financial Resources: Social Security, Family Support Financial Screen Referred: No Living Expenses: Own Money Management: Spouse Does the patient have any problems obtaining your medications?: No Home Management: wife Patient/Family Preliminary Plans: Return home with wife who does work two to three days per week. Pt did go to the real estate office a couple days per week with son prior to admission. Wife is aware he may need 24/7 care at discharge , wants to await progress. Care Coordinator Anticipated Follow Up Needs: HH/OP  Clinical Impression Pleasant gentleman that remembers being here two years ago. His wife is involved and will work on care plan. Son also assists Dad. Aware of rehab process due to was here in 2022. Will await team's evaluations and work on discharge needs.  Lucy Chris 10/14/2023, 3:19 PM

## 2023-10-14 NOTE — Progress Notes (Signed)
 Inpatient Rehabilitation Center Individual Statement of Services  Patient Name:  Juan Brewer  Date:  10/14/2023  Welcome to the Inpatient Rehabilitation Center.  Our goal is to provide you with an individualized program based on your diagnosis and situation, designed to meet your specific needs.  With this comprehensive rehabilitation program, you will be expected to participate in at least 3 hours of rehabilitation therapies Monday-Friday, with modified therapy programming on the weekends.  Your rehabilitation program will include the following services:  Physical Therapy (PT), Occupational Therapy (OT), Speech Therapy (ST), 24 hour per day rehabilitation nursing, Neuropsychology, Care Coordinator, Rehabilitation Medicine, Nutrition Services, and Pharmacy Services  Weekly team conferences will be held on Wednesday to discuss your progress.  Your Inpatient Rehabilitation Care Coordinator will talk with you frequently to get your input and to update you on team discussions.  Team conferences with you and your family in attendance may also be held.  Expected length of stay: 3 weeks  Overall anticipated outcome: min assist level  Depending on your progress and recovery, your program may change. Your Inpatient Rehabilitation Care Coordinator will coordinate services and will keep you informed of any changes. Your Inpatient Rehabilitation Care Coordinator's name and contact numbers are listed  below.  The following services may also be recommended but are not provided by the Inpatient Rehabilitation Center:   Home Health Rehabiltiation Services Outpatient Rehabilitation Services    Arrangements will be made to provide these services after discharge if needed.  Arrangements include referral to agencies that provide these services.  Your insurance has been verified to be:  Medicare & BCBS Your primary doctor is:  Engineer, maintenance (IT)  Pertinent information will be shared with your doctor and  your insurance company.  Inpatient Rehabilitation Care Coordinator:  Dossie Der, [place] [phone] or Luna Glasgow  Information discussed with and copy given to patient by: Lucy Chris, 10/14/2023, 3:20 PM

## 2023-10-14 NOTE — Progress Notes (Signed)
 Inpatient Rehabilitation  Patient information reviewed and entered into eRehab system by Feliberto Gottron, M.A., CCC-SLP, Rehab Quality Coordinator.  Information including medical coding, functional ability and quality indicators will be reviewed and updated through discharge.

## 2023-10-14 NOTE — Evaluation (Signed)
 Physical Therapy Assessment and Plan  Patient Details  Name: Juan Brewer MRN: [id number] Date of Birth: August 01, 1945  PT Diagnosis: Abnormal posture, Abnormality of gait, Difficulty walking, Hemiplegia dominant, Impaired cognition, and Muscle weakness Rehab Potential: Fair ELOS: 3 weeks   Today's Date: 10/14/2023 PT Individual Time: 1415-1430 PT Individual Time Calculation (min): 15 min  and Today's Date: 10/14/2023 PT Missed Time: 60 Minutes Missed Time Reason: Patient unwilling to participate;Patient fatigue   Hospital Problem: Principal Problem:   Encephalopathy   Past Medical History:  Past Medical History:  Diagnosis Date   Arthritis    back   Hypertension    Stroke (cerebrum) [facility])    Past Surgical History:  Past Surgical History:  Procedure Laterality Date   CHOLECYSTECTOMY     LUMBAR DISC SURGERY  1971   VENTRICULOPERITONEAL SHUNT Right 12/25/2022   Procedure: RIGHT SHUNT INSERTION VENTRICULAR-PERITONEAL;  Surgeon: Tressie Stalker, MD;  Location: [facility] OR;  Service: Neurosurgery;  Laterality: Right;  RM 21PATIENT CANT MOVE UP    Assessment & Plan Clinical Impression: Patient is a 78 year old male with a 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 Lovell Sheehan in May 2024.  He also has a history of prior CVA with right-sided weakness and presented to [facility] on 10/04/2023 with increased weakness and confusion.  He was admitted there and neurology consulted.  Patient complained of increased right lower extremity weakness greater than baseline.  Stroke swallow evaluation was obtained and he was allowed p.o. intake.  CT head without acute findings, presence of ventriculostomy with normal ventricular size.  CTA of head and neck performed with no emergent large vessel occlusion or hemodynamically significant stenosis.  He also underwent echocardiogram with normal ejection fraction, no thrombus or PFO.  His  hemoglobin A1c is 6%.  LDL 55.  Neurology recommended MRI of the brain without contrast but this could not be performed due to the presence of the patient's programmable VP shunt.  He was transferred to [facility] for consultation with Dr. Lovell Sheehan.  On arrival, noted to have significant cough by Dr. Amada Jupiter which had worsened over the past week.  He has suspicion is that patient likely had an acute Visiol stressor such as bronchitis, URI, etc.  Factious workup carried out.  Found to have urinary tract infection and started on Rocephin.  Culture grew mixed flora.  Viral panel negative.  Started on bronchodilators and Tessalon Perles.  Home Plavix and statin continued.  He has significant history of anxiety and insomnia and treated with triazolam and Xanax.  Started on melatonin for sleep.  He has a history of tobacco abuse 1 pack/day and NicoDerm patches placed.  Pressure injury noted of the right left mid buttock.  MRI completed without acute intracranial abnormality.  His VP shunt was reprogrammed by neurosurgery after the MRI.  Developed acute onset of right shoulder pain on 3/16 with erythema and edema of right forearm and hand.  + fever. 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 Blood cultures obtained and Ancef IV started. Orthopedic surgery and ID consulted and joint aspiration performed. Fluid aspirate suggests pseudogout. Held off on ESI due to fever. Now on Duricef 500 mg BID thru 3/23 per ID recs..   Physical therapy note yesterday, the patient required mod-max assist for bed mobility today and max support initially for seated balance with heavy Rt and posterior lean as he fatigued. Attempted  sit<>stand with Stedy 3x from elevated EOB, pt returned to supine due to fatigue. Cues for rolling Rt/Lt to place maxisky pad and pt transfer bed>chair dependently via lift. The patient requires inpatient medicine and rehabilitation evaluations and services for ongoing  dysfunction secondary to encephalopathy. Patient transferred to CIR on 10/13/2023 .   Patient currently requires max with mobility secondary to muscle weakness and muscle joint tightness, decreased cardiorespiratoy endurance, decreased initiation, decreased attention, decreased awareness, decreased problem solving, decreased safety awareness, decreased memory, and delayed processing, and decreased sitting balance, decreased standing balance, decreased postural control, hemiplegia, and decreased balance strategies.  Prior to hospitalization, patient was supervision with mobility and lived with Spouse in a House home.  Home access is 1Stairs to enter.  Patient will benefit from skilled PT intervention to maximize safe functional mobility, minimize fall risk, and decrease caregiver burden for planned discharge home with 24 hour assist.  Anticipate patient will benefit from follow up [facility] at discharge.  PT - End of Session Activity Tolerance: Tolerates < 10 min activity, no significant change in vital signs Endurance Deficit: Yes Endurance Deficit Description: generalized weakness PT Assessment Rehab Potential (ACUTE/IP ONLY): Fair PT Barriers to Discharge: Decreased caregiver support;Home environment access/layout;Insurance for SNF coverage;Incontinence;Lack of/limited family support;Behavior PT Patient demonstrates impairments in the following area(s): Balance;Endurance;Motor;Pain;Perception;Safety;Sensory;Skin Integrity PT Transfers Functional Problem(s): Bed Mobility;Bed to Chair;Car PT Locomotion Functional Problem(s): Ambulation;Stairs;Wheelchair Mobility PT Plan PT Intensity: Minimum of 1-2 x/day ,45 to 90 minutes PT Frequency: 5 out of 7 days PT Duration Estimated Length of Stay: 3 weeks PT Treatment/Interventions: Ambulation/gait training;Discharge planning;Functional mobility training;Psychosocial support;Therapeutic Activities;Visual/perceptual remediation/compensation;Wheelchair  propulsion/positioning;Therapeutic Exercise;Skin care/wound management;Neuromuscular re-education;Balance/vestibular training;Disease management/prevention;Cognitive remediation/compensation;DME/adaptive equipment instruction;Pain management;Splinting/orthotics;UE/LE Strength taining/ROM;UE/LE Coordination activities;Stair training;Patient/family education;Community reintegration;Functional electrical stimulation PT Transfers Anticipated Outcome(s): minA PT Locomotion Anticipated Outcome(s): short distance minA with RW (anticipate wheelchair level) PT Recommendation Recommendations for Other Services: None Follow Up Recommendations: Home health PT;24 hour supervision/assistance Patient destination: Home Equipment Recommended: To be determined   PT Evaluation Precautions/Restrictions Precautions Precautions: Fall Precaution/Restrictions Comments: Fear of Falling Restrictions Weight Bearing Restrictions Per Provider Order: No Pain Interference Pain Interference Pain Effect on Sleep: 2. Occasionally Pain Interference with Therapy Activities: 2. Occasionally Pain Interference with Day-to-Day Activities: 2. Occasionally Home Living/Prior Functioning Home Living Available Help at Discharge: Family;Available PRN/intermittently Type of Home: House Home Access: Stairs to enter Entrance Stairs-Number of Steps: 1 Entrance Stairs-Rails: Right Home Layout: Able to live on main level with bedroom/bathroom;Two level Alternate Level Stairs-Number of Steps: pt reports he does not need to go upstairs Bathroom Shower/Tub: Engineer, manufacturing systems: Handicapped height Additional Comments: Spouse notes that pt has had some falls, pt denied having falls  Lives With: Spouse Prior Function Level of Independence: Independent with basic ADLs;Independent with transfers;Independent with gait  Able to Take Stairs?: Yes Driving: No Vocation: Part time employment Vocation Requirements: Owns Real  estate buisness Vision/Perception  Vision - History Ability to See in Adequate Light: 1 Impaired Vision - Assessment Additional Comments: diffiulting with head   turning to the right but able to turn eyes to the right Perception Perception: Impaired Preception Impairment Details: Inattention/Neglect Perception-Other Comments: right side / PTA with previous stroke Praxis Praxis: Impaired Praxis Impairment Details: Motor planning;Initiation Praxis-Other Comments: fear of movement  Cognition Overall Cognitive Status: Within Functional Limits for tasks assessed Arousal/Alertness: Awake/alert Attention: Sustained;Selective Sustained Attention: Impaired Sustained Attention Impairment: Verbal basic;Functional basic Selective Attention: Impaired Memory: Impaired Memory Impairment: Storage deficit;Retrieval deficit;Decreased recall of new information Awareness Impairment: Intellectual impairment  Problem Solving: Impaired Problem Solving Impairment: Verbal basic;Functional basic Executive Function: Organizing;Self Monitoring;Self Correcting Organizing: Impaired Organizing Impairment: Functional basic Safety/Judgment: Impaired Comments: all higher level of  cognition all impairement Sensation Sensation Light Touch: Impaired Detail Light Touch Impaired Details: Impaired RUE;Impaired RLE Hot/Cold: Not tested Proprioception: Appears Intact Stereognosis: Not tested Coordination Gross Motor Movements are Fluid and Coordinated: No Fine Motor Movements are Fluid and Coordinated: Yes Coordination and Movement Description: Global weakness and deconditioning; fear of falling inhibiting movement and effort Motor  Motor Motor: Hemiplegia;Abnormal postural alignment and control Motor - Skilled Clinical Observations: 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   Trunk/Postural Assessment  Cervical Assessment Cervical Assessment: Exceptions to [facility] Thoracic Assessment Thoracic Assessment: Exceptions to  [facility] Lumbar Assessment Lumbar Assessment: Exceptions to [facility] Postural Control Postural Control: Deficits on evaluation  Balance Balance Balance Assessed: Yes Static Sitting Balance Static Sitting - Balance Support: Feet supported;Bilateral upper extremity supported Static Sitting - Level of Assistance: 2: Max assist Dynamic Sitting Balance Dynamic Sitting - Balance Support: Feet supported;Bilateral upper extremity supported Dynamic Sitting - Level of Assistance: 2: Max assist Static Standing Balance Static Standing - Balance Support: During functional activity;Bilateral upper extremity supported Static Standing - Level of Assistance: 1: +2 Total assist Dynamic Standing Balance Dynamic Standing - Balance Support: During functional activity;Bilateral upper extremity supported Dynamic Standing - Level of Assistance: 1: +2 Total assist Extremity Assessment  RUE Assessment RUE Assessment: Exceptions to [facility] Active Range of Motion (AROM) Comments: 110 degrees of shoulder flexion RUE Body System: Neuro Brunstrum levels for arm and hand: Hand;Arm Brunstrum level for arm: Stage V Relative Independence from Synergy Brunstrum level for hand: Stage VI Isolated joint movements LUE Assessment LUE Assessment: Exceptions to [facility] General Strength Comments: 4/5 RLE Assessment RLE Assessment: Exceptions to [facility] General Strength Comments: Grossy 4/5 LLE Assessment LLE Assessment: Exceptions to [facility] General Strength Comments: Grossly 4/5  Care Tool Care Tool Bed Mobility Roll left and right activity   Roll left and right assist level: Maximal Assistance - Patient 25 - 49%    Sit to lying activity   Sit to lying assist level: Maximal Assistance - Patient 25 - 49%    Lying to sitting on side of bed activity   Lying to sitting on side of bed assist level: the ability to move from lying on the back to sitting on the side of the bed with no back support.: Maximal Assistance - Patient 25 - 49%      Care Tool Transfers Sit to stand transfer   Sit to stand assist level: 2 Helpers    Chair/bed transfer   Chair/bed transfer assist level: 2 Web designer transfer activity did not occur: Safety/medical concerns        Care Tool Locomotion Ambulation   Assist level: 2 helpers Assistive device: Walker-rolling Max distance: 59ft  Walk 10 feet activity   Assist level: 2 helpers Assistive device: Walker-rolling   Walk 50 feet with 2 turns activity Walk 50 feet with 2 turns activity did not occur: Safety/medical concerns      Walk 150 feet activity Walk 150 feet activity did not occur: Safety/medical concerns      Walk 10 feet on uneven surfaces activity Walk 10 feet on uneven surfaces activity did not occur: Safety/medical concerns      Stairs Stair activity did not occur: Safety/medical concerns        Walk up/down 1 step activity Walk up/down 1 step or curb (drop  down) activity did not occur: Safety/medical concerns      Walk up/down 4 steps activity Walk up/down 4 steps activity did not occur: Safety/medical concerns      Walk up/down 12 steps activity Walk up/down 12 steps activity did not occur: Safety/medical concerns      Pick up small objects from floor Pick up small object from the floor (from standing position) activity did not occur: Safety/medical concerns      Wheelchair Is the patient using a wheelchair?: Yes Type of Wheelchair: Manual   Wheelchair assist level: Dependent - Patient 0%    Wheel 50 feet with 2 turns activity   Assist Level: Dependent - Patient 0%  Wheel 150 feet activity   Assist Level: Dependent - Patient 0%    Refer to Care Plan for Long Term Goals  SHORT TERM GOAL WEEK 1 PT Short Term Goal 1 (Week 1): Pt will complete bed mobility with modA PT Short Term Goal 2 (Week 1): Pt will complete bed<>chair transfers with modA PT Short Term Goal 3 (Week 1): Pt will ambulate 29ft with modA and LRAD PT Short Term Goal 4  (Week 1): Pt will tolerate sitting OOB for > 1 hour between therapy sessions  Recommendations for other services: None   Skilled Therapeutic Intervention Mobility Bed Mobility Bed Mobility: Sit to Supine;Supine to Sit Supine to Sit: Maximal Assistance - Patient - Patient 25-49% Sit to Supine: Maximal Assistance - Patient 25-49% Transfers Transfers: Sit to Stand;Stand to Sit;Stand Pivot Transfers Sit to Stand: 2 Helpers Stand to Sit: 2 Helpers Stand Pivot Transfers: 2 Advertising account planner (Assistive device): Biomedical engineer Ambulation: Yes Gait Assistance: 2 Helpers;Moderate Assistance - Patient 50-74%;Maximal Assistance - Patient 25-49% Gait Distance (Feet): 10 Feet Assistive device: Rolling walker Gait Assistance Details: Tactile cues for initiation;Tactile cues for posture;Tactile cues for weight shifting;Verbal cues for precautions/safety;Verbal cues for safe use of DME/AE;Verbal cues for technique;Verbal cues for gait pattern;Manual facilitation for weight shifting;Manual facilitation for placement Gait Gait: Yes Gait Pattern: Impaired Gait Pattern: Step-to pattern;Trunk flexed;Poor foot clearance - left;Poor foot clearance - right;Right flexed knee in stance;Left flexed knee in stance;Decreased dorsiflexion - right;Decreased dorsiflexion - left;Decreased hip/knee flexion - left;Decreased hip/knee flexion - right Stairs / Additional Locomotion Stairs: No Wheelchair Mobility Wheelchair Mobility: No   Direct handoff of care from OT evaluation. Patient sitting in wheelchair and appears agreeable to mobility with encouragement. Patient requiring +2 mod/maxA for standing to RW with heavy posterior lean and significant forward flexed hips and trunk. Patient has RW too far away from body with poor safety awareness. He requires +2 maxA for short distance gait up to 34ft using the RW. No external assist for managing his BLE but heavy assist needed for trunk support, RW  management, and balance. Pt expressing high levels of fear of falling. Pt needing assist for returning to supine and for boosting up to [facility]. He participated in bed level there-ex for BLE strengthening including heel slides, SLR, and glut sets - 1x10 each. Pt refuses further mobility 2/2 fatigue and fear of falling. He's oriented x4 but does demonstrate some significant cognitive impairments and is a questionable historian. He missed time from PT evaluation and will attempt to make up as able.    Discharge Criteria: Patient will be discharged from PT if patient refuses treatment 3 consecutive times without medical reason, if treatment goals not met, if there is a change in medical status, if patient makes no progress towards goals or if  patient is discharged from hospital.  The above assessment, treatment plan, treatment alternatives and goals were discussed and mutually agreed upon: by patient  Jefry Lesinski P Genia Perin PT 10/14/2023, 2:50 PM

## 2023-10-14 NOTE — Plan of Care (Signed)
  Problem: RH Balance Goal: LTG Patient will maintain dynamic sitting balance (PT) Description: LTG:  Patient will maintain dynamic sitting balance with assistance during mobility activities (PT) Flowsheets (Taken 10/14/2023 1503) LTG: Pt will maintain dynamic sitting balance during mobility activities with:: Contact Guard/Touching assist Goal: LTG Patient will maintain dynamic standing balance (PT) Description: LTG:  Patient will maintain dynamic standing balance with assistance during mobility activities (PT) Flowsheets (Taken 10/14/2023 1503) LTG: Pt will maintain dynamic standing balance during mobility activities with:: Minimal Assistance - Patient > 75%   Problem: Sit to Stand Goal: LTG:  Patient will perform sit to stand with assistance level (PT) Description: LTG:  Patient will perform sit to stand with assistance level (PT) Flowsheets (Taken 10/14/2023 1503) LTG: PT will perform sit to stand in preparation for functional mobility with assistance level: Minimal Assistance - Patient > 75%   Problem: RH Bed Mobility Goal: LTG Patient will perform bed mobility with assist (PT) Description: LTG: Patient will perform bed mobility with assistance, with/without cues (PT). Flowsheets (Taken 10/14/2023 1503) LTG: Pt will perform bed mobility with assistance level of: Minimal Assistance - Patient > 75%   Problem: RH Bed to Chair Transfers Goal: LTG Patient will perform bed/chair transfers w/assist (PT) Description: LTG: Patient will perform bed to chair transfers with assistance (PT). Flowsheets (Taken 10/14/2023 1503) LTG: Pt will perform Bed to Chair Transfers with assistance level: Minimal Assistance - Patient > 75%   Problem: RH Car Transfers Goal: LTG Patient will perform car transfers with assist (PT) Description: LTG: Patient will perform car transfers with assistance (PT). Flowsheets (Taken 10/14/2023 1503) LTG: Pt will perform car transfers with assist:: Minimal Assistance - Patient  > 75%   Problem: RH Ambulation Goal: LTG Patient will ambulate in controlled environment (PT) Description: LTG: Patient will ambulate in a controlled environment, # of feet with assistance (PT). Flowsheets (Taken 10/14/2023 1503) LTG: Pt will ambulate in controlled environ  assist needed:: Minimal Assistance - Patient > 75% LTG: Ambulation distance in controlled environment: 58ft Goal: LTG Patient will ambulate in home environment (PT) Description: LTG: Patient will ambulate in home environment, # of feet with assistance (PT). Flowsheets (Taken 10/14/2023 1503) LTG: Pt will ambulate in home environ  assist needed:: Minimal Assistance - Patient > 75% LTG: Ambulation distance in home environment: 23ft   Problem: RH Wheelchair Mobility Goal: LTG Patient will propel w/c in controlled environment (PT) Description: LTG: Patient will propel wheelchair in controlled environment, # of feet with assist (PT) Flowsheets (Taken 10/14/2023 1503) LTG: Pt will propel w/c in controlled environ  assist needed:: Supervision/Verbal cueing LTG: Propel w/c distance in controlled environment: 20ft Goal: LTG Patient will propel w/c in home environment (PT) Description: LTG: Patient will propel wheelchair in home environment, # of feet with assistance (PT). Flowsheets (Taken 10/14/2023 1503) LTG: Pt will propel w/c in home environ  assist needed:: Supervision/Verbal cueing LTG: Propel w/c distance in home environment: 66ft

## 2023-10-14 NOTE — Progress Notes (Signed)
 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  Noted that he has been weak and previous for the past week.  CT head with no acute findings.  CTA head and neck with no acute findings.  Neurology recommend MRI brain patient racemosa's:.  MRI brain without acute findings.  Patient's hospital course complicated by right hand/forearm cellulitis, right shoulder pain ID engaged for fevers and antibiotic recommendations.[name] #Fever 2/2 #1 2 #Right forearm cellulitis #Right shoulder pain -Right shoulder showed glenohumeral effusion.  Patient underwent aspiration with IR: 1800 cc, 50% neutrophils, 19% lymphocyte, 31% monocyte with intracellular calcium pyrophosphate distress.  Recommendations: -Discontinue cefazolin - Follow-up blood cultures - Cefadroxil x 5 days to treat cellulitis.  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  Wife at bedside. Interval: Afebrile overnight.  WBC 9.3K.  Review of Systems: Review of Systems  All other systems reviewed and are negative.    Scheduled Meds:  atenolol  25 mg Oral Daily   benzonatate  200 mg Oral TID   cefadroxil  500 mg Oral BID   clopidogrel  75 mg Oral Daily   diclofenac Sodium  2 g Topical TID   enoxaparin (LOVENOX) injection  40 mg  Subcutaneous Q24H   fluticasone furoate-vilanterol  1 puff Inhalation Daily   And   umeclidinium bromide  1 puff Inhalation Daily   melatonin  3 mg Oral QHS   nicotine  21 mg Transdermal Daily   polyethylene glycol  17 g Oral Daily   simvastatin  40 mg Oral QHS   traZODone  50 mg Oral QHS   triazolam  0.25 mg Oral QHS   Continuous Infusions: PRN Meds:.acetaminophen, ALPRAZolam, calcium carbonate, guaiFENesin-dextromethorphan, ipratropium-albuterol, LORazepam, ondansetron **OR** ondansetron (ZOFRAN) IV, senna-docusate, sodium phosphate No Known Allergies  OBJECTIVE: Vitals:   10/13/23 1332 10/13/23 1947 10/14/23 0400 10/14/23 0906  BP:  105/67 118/73   Pulse:  73 61   Resp:  16 16   Temp:  98.5 F (36.9 C) 98.3 F (36.8 C)   TempSrc:  Oral Oral   SpO2:  92% 95% 96%  Weight: 75.6 kg     Height: 5\' 11"  (1.803 m)      Body mass index is 23.25 kg/m.  Physical Exam Constitutional:      General: He is not in acute distress.    Appearance: He is normal weight. He is not toxic-appearing.  HENT:     Head: Normocephalic and atraumatic.     Right Ear: External ear normal.     Left Ear: External ear normal.     Nose: No congestion or rhinorrhea.     Mouth/Throat:     Mouth: Mucous membranes are moist.     Pharynx: Oropharynx is clear.  Eyes:     Extraocular Movements: Extraocular movements intact.     Conjunctiva/sclera: Conjunctivae normal.  Pupils: Pupils are equal, round, and reactive to light.  Cardiovascular:     Rate and Rhythm: Normal rate and regular rhythm.     Heart sounds: No murmur heard.    No friction rub. No gallop.  Pulmonary:     Effort: Pulmonary effort is normal.     Breath sounds: Normal breath sounds.  Abdominal:     General: Abdomen is flat. Bowel sounds are normal.     Palpations: Abdomen is soft.  Musculoskeletal:        General: No swelling.     Cervical back: Normal range of motion and neck supple.     Comments: Right arm erythema improved.    Skin:    General: Skin is warm and dry.  Neurological:     General: No focal deficit present.     Mental Status: He is oriented to person, place, and time.  Psychiatric:        Mood and Affect: Mood normal.       Lab Results Lab Results  Component Value Date   WBC 9.3 10/14/2023   HGB 11.6 (L) 10/14/2023   HCT 34.7 (L) 10/14/2023   MCV 89.0 10/14/2023   PLT 154 10/14/2023    Lab Results  Component Value Date   CREATININE 1.11 10/14/2023   BUN 24 (H) 10/14/2023   NA 135 10/14/2023   K 3.8 10/14/2023   CL 103 10/14/2023   CO2 23 10/14/2023    Lab Results  Component Value Date   ALT 44 10/14/2023   AST 84 (H) 10/14/2023   ALKPHOS 93 10/14/2023   BILITOT 0.6 10/14/2023        Danelle Earthly, MD [facility] 10/14/2023, 9:28 AM

## 2023-10-15 LAB — RENAL FUNCTION PANEL
Albumin: 2.2 g/dL — ABNORMAL LOW (ref 3.5–5.0)
Anion gap: 4 — ABNORMAL LOW (ref 5–15)
BUN: 26 mg/dL — ABNORMAL HIGH (ref 8–23)
CO2: 24 mmol/L (ref 22–32)
Calcium: 8.6 mg/dL — ABNORMAL LOW (ref 8.9–10.3)
Chloride: 107 mmol/L (ref 98–111)
Creatinine, Ser: 1.19 mg/dL (ref 0.61–1.24)
GFR, Estimated: >60 mL/min (ref >60)
Glucose, Bld: 119 mg/dL — ABNORMAL HIGH (ref 70–99)
Phosphorus: 2.3 mg/dL — ABNORMAL LOW (ref 2.5–4.6)
Potassium: 3.4 mmol/L — ABNORMAL LOW (ref 3.5–5.1)
Sodium: 135 mmol/L (ref 135–145)

## 2023-10-15 MED ORDER — NICOTINE 14 MG/24HR TD PT24
14.0000 mg | MEDICATED_PATCH | Freq: Every day | TRANSDERMAL | Status: DC
  Administered 2023-10-16: 14 mg via TRANSDERMAL
  Filled 2023-10-15: qty 1

## 2023-10-15 MED ORDER — POTASSIUM CHLORIDE 20 MEQ PO PACK
40.0000 meq | PACK | Freq: Once | ORAL | Status: AC
  Administered 2023-10-15: 40 meq via ORAL
  Filled 2023-10-15: qty 2

## 2023-10-15 NOTE — Progress Notes (Signed)
 Occupational Therapy Session Note  Patient Details  Name: Juan Brewer MRN: [id number] Date of Birth: 01/16/1946  Today's Date: 10/15/2023 OT Individual Time: 7829-5621 OT Individual Time Calculation (min): 56 min    Short Term Goals: Week 1:  OT Short Term Goal 1 (Week 1): Pt will initiate movement to EOB with no more than mod cueing OT Short Term Goal 2 (Week 1): Pt will complete sit > stand with max A of 1 OT Short Term Goal 3 (Week 1): Pt will improve awareness of deficits by asking for assist during LB dressing OT Short Term Goal 4 (Week 1): Pt will attend to RUE during grooming task with mod cueing  Skilled Therapeutic Interventions/Progress Updates:    Patient received supine in bed - lunch at bedside, heating pad on floor beside bed.  Patient unaware that he had a heating pad.  Reports occasional back pain.  Patient recalled that he agreed to get up this session.  Patient assisted to sit at edge of bed on left side of bed with min assist.  Patient required max assist to stand pivot transfer to wheelchair.  Patient with strong posterior preference and resistant to any forward guidance.  Patient once up in chair interested in seeing what he had for lunch.  Patient ate a chocolate chip cookie and had a partial bottle of water.  Had no interest, and declined cheeseburger on tray.  Patient did feel he may be interested in a protein drink supplement in the future - not now.  Patient transported to sink to wash face and brush teeth, comb hair.  Patient predominantly uses LUE however has much capability in Right hand (older R hemiparesis - learned non use)   Patient able to verbalize that he cannot walk like he did before this hospitalization.  Reports he has not walked yet.  Transported to gym to address sit to stand with use of grab bar, side stepping, left and right then walking short distances in parallel bars with second person to follow with wheelchair.  Patient with limited ability to  understand consequences of frequent refusals - yet clearly does best when he is allowed to move with minimal assist/ intervention.  Returned to room and back to bed.  Bed alarm engaged and call bell/ personal items in reach.    Therapy Documentation Precautions:  Precautions Precautions: Fall Precaution/Restrictions Comments: Fear of Falling Restrictions Weight Bearing Restrictions Per Provider Order: No    Pain: Pain Assessment Pain Scale: 0-10 Pain Score: 0-No pain    Therapy/Group: Individual Therapy  Collier Salina 10/15/2023, 2:10 PM

## 2023-10-15 NOTE — Progress Notes (Signed)
 Physical Therapy Session Note  Patient Details  Name: Juan Brewer MRN: [id number] Date of Birth: [birth year]  Today's Date: 10/15/2023 PT Individual Time: 1015-1112 PT Individual Time Calculation (min): 57 min   Short Term Goals: Week 1:  PT Short Term Goal 1 (Week 1): Pt will complete bed mobility with modA PT Short Term Goal 2 (Week 1): Pt will complete bed<>chair transfers with modA PT Short Term Goal 3 (Week 1): Pt will ambulate 69ft with modA and LRAD PT Short Term Goal 4 (Week 1): Pt will tolerate sitting OOB for > 1 hour between therapy sessions  Skilled Therapeutic Interventions/Progress Updates:      Pt in bed to start - pt fully dressed and clean. He's agreeable to PT tx and denies any specific pains.   Supine<>sitting using hospital bed features with modA for initiation and trunk support. Requires maxA for forward scooting to EOB. +2 maxA for completing squat pivot transfer into TIS w/c - cues for hand placement to help facilitate forward weight shifting.  Transported to day room rehab gym for time.  Worked on sit<>stands using back of chair in front of him to help reduce his fear of falling. He requires +2 maxA for sit<>stand and able to stand max 10 seconds before fatigue. Completed several rounds of this with emphasis on increasing B knee extenion, trunk extension, and hip extension as he tends to keep a "Squatted" position in standing.   Completed seated Kinetron x10 minutes at L50cm/sec resistance with emphasis on full AROM, coordination, and adequate cadence. Pt requires cues for initiation and effort.   Attempted to adjust his headrest of TIS but unable to accommodate due to stripped screws. Did provide him with a LLE leg rest.   Returned to room and required totalA for a squat pivot transfer back to bed. Patient somewhat resistive of movement and push's himself in opposite direction of transfer. ModA required for returning to supine and for repositioning. All  needs met at end with his alarm on.   *Pt requiring max encouragement throughout session for participation and effort.   Therapy Documentation Precautions:  Precautions Precautions: Fall Precaution/Restrictions Comments: Fear of Falling Restrictions Weight Bearing Restrictions Per Provider Order: No General:      Therapy/Group: Individual Therapy  Juan Brewer 10/15/2023, 7:54 AM

## 2023-10-15 NOTE — Progress Notes (Signed)
 Occupational Therapy Session Note  Patient Details  Name: Juan Brewer MRN: [id number] Date of Birth: [birth year]  Today's Date: 10/15/2023 OT Individual Time: 1660-6301 OT Individual Time Calculation (min): 42 min    Short Term Goals: Week 1:  OT Short Term Goal 1 (Week 1): Pt will initiate movement to EOB with no more than mod cueing OT Short Term Goal 2 (Week 1): Pt will complete sit > stand with max A of 1 OT Short Term Goal 3 (Week 1): Pt will improve awareness of deficits by asking for assist during LB dressing OT Short Term Goal 4 (Week 1): Pt will attend to RUE during grooming task with mod cueing  Skilled Therapeutic Interventions/Progress Updates:    Patient supported sitting in bed upon arrival.  Patient finished eating fruit cup from breakfast tray.  Patient very fearful of getting out of bed - frequently declining.  Worked on rolling left and right with increased time and moving toward targets to allow patient to move versus attempting to move patient.  Patient resists all attempts to encourage movement.  Patient able to roll left and right with min assist - more challenging rolling to right side - maintains head tipped toward left.  Needs cueing to turn head toward right in rolling.  Worked on bridging to pull pants over hips.  Transitioned supine to sidelying to sitting at edge of bed with min assist but INCREASED time.  Worked on components of level surface transfer encouraging seated frward weight shift of torso over base.  Patient refused getting out of bed- so worked on returning to supine position, bridging and adjusting position in bed.  Left supine with call bell in reach and bed alarm engaged.  Reviewed next appointment with patient, and explained that getting out of bed was crucial to leaving the hospital.    Therapy Documentation Precautions:  Precautions Precautions: Fall Precaution/Restrictions Comments: Fear of Falling Restrictions Weight Bearing Restrictions  Per Provider Order: No   Pain:  Denies pain then indicates he has back pain at times.         Therapy/Group: Individual Therapy  Collier Salina 10/15/2023, 8:13 AM

## 2023-10-15 NOTE — Progress Notes (Signed)
 Speech Language Pathology Daily Session Note  Patient Details  Name: Juan Brewer MRN: [id number] Date of Birth: Jan 30, 1946  Today's Date: 10/15/2023 SLP Individual Time: 2725-3664 SLP Individual Time Calculation (min): 41 min  Short Term Goals: Week 1: SLP Short Term Goal 1 (Week 1): Patient will orient to date and time utilizing available external aids given mod verbal cues. SLP Short Term Goal 2 (Week 1): Patient will recall daily events with 75% accuracy given mod verbal cues. SLP Short Term Goal 3 (Week 1): Patient will demonstrate intellectual awareness of deficits by stating 2 cognitive and 2 physical deficits given mod verbal cues. SLP Short Term Goal 4 (Week 1): Patient will attend to functional therapy tasks for 15 minutes given mod verbal cues. SLP Short Term Goal 5 (Week 1): Patient will solve basic functional problems with 75% accuracy given mod verbal cues. SLP Short Term Goal 6 (Week 1): Patient will tolerate regular/thin liquid diet during clinical diet tolerance assessments without overt s/sx concerning for aspiration.  Skilled Therapeutic Interventions: SLP conducted skilled therapy session targeting cognitive retraining goals. Upon SLP entry, NT finishing personal care with patient requesting to don shorts. NT and SLP assisted with LB dressing with patient benefiting from one to one cues to follow commands and perform positioning appropriate for dressing assistance at the bed level. SLP then facilitated basic money addition problems with patient benefiting from min to mod cues to solve, with necessity for cues increasing with prompt complexity. In remaining minutes of session, targeted clock formation and telling time. Patient benefited from max cues to accurately draw clock and place hands based on time indicated by SLP. Patient was left in lowered bed with call bell in reach and bed alarm set. SLP will continue to target goals per plan of care.       Pain Pain  Assessment Pain Scale: 0-10 Pain Score: 0-No pain  Therapy/Group: Individual Therapy  Jeannie Done, M.A., CCC-SLP  [name] 10/15/2023, 3:53 PM

## 2023-10-15 NOTE — Progress Notes (Signed)
 PROGRESS NOTE   Subjective/Complaints: No new complaints this morning, sleepy Tolerated therapy well today Fearful of getting out of bed Continues to have back pain  ROS: +low back pain- continues  Objective:   VAS [place] UPPER EXTREMITY VENOUS DUPLEX Result Date: 10/15/2023 UPPER VENOUS STUDY  Patient Name:  Juan Brewer  Date of Exam:   10/13/2023 Medical Rec #: [id number]           Accession #:    [id number] Date of Birth: 02-Feb-1946            Patient Gender: M Patient Age:   78 years Exam Location:  [facility] Procedure:      VAS [place] UPPER EXTREMITY VENOUS DUPLEX Referring Phys: RIPUDEEP RAI --------------------------------------------------------------------------------  Indications: Swelling, and Redness Risk Factors: None identified. Comparison Study: None. Performing Technologist: Shona Simpson  Examination Guidelines: A complete evaluation includes B-mode imaging, spectral Doppler, color Doppler, and power Doppler as needed of all accessible portions of each vessel. Bilateral testing is considered an integral part of a complete examination. Limited examinations for reoccurring indications may be performed as noted.  Right Findings: +----------+------------+---------+-----------+----------+-------+ RIGHT     CompressiblePhasicitySpontaneousPropertiesSummary +----------+------------+---------+-----------+----------+-------+ IJV           Full       Yes       Yes                      +----------+------------+---------+-----------+----------+-------+ Subclavian    Full       Yes       Yes                      +----------+------------+---------+-----------+----------+-------+ Axillary      Full       Yes       Yes                      +----------+------------+---------+-----------+----------+-------+ Brachial      Full       Yes       Yes                       +----------+------------+---------+-----------+----------+-------+ Radial        Full                 Yes                      +----------+------------+---------+-----------+----------+-------+ Ulnar         Full                 Yes                      +----------+------------+---------+-----------+----------+-------+ Cephalic      Full                 Yes                      +----------+------------+---------+-----------+----------+-------+ Basilic       Full  Yes                      +----------+------------+---------+-----------+----------+-------+  Left Findings: +----------+------------+---------+-----------+----------+-------+ LEFT      CompressiblePhasicitySpontaneousPropertiesSummary +----------+------------+---------+-----------+----------+-------+ Subclavian    Full       Yes       Yes                      +----------+------------+---------+-----------+----------+-------+  Summary:  Right: No evidence of deep vein thrombosis in the upper extremity. No evidence of superficial vein thrombosis in the upper extremity.  Left: No evidence of thrombosis in the subclavian.  *See table(s) above for measurements and observations.  Diagnosing physician: Coral Else MD Electronically signed by Coral Else MD on 10/15/2023 at 7:41:07 AM.    Final    Recent Labs    10/13/23 0508 10/14/23 0518  WBC 10.4 9.3  HGB 11.5* 11.6*  HCT 34.3* 34.7*  PLT 143* 154   Recent Labs    10/14/23 0519 10/15/23 0612  NA 135 135  K 3.8 3.4*  CL 103 107  CO2 23 24  GLUCOSE 138* 119*  BUN 24* 26*  CREATININE 1.11 1.19  CALCIUM 8.9 8.6*    Intake/Output Summary (Last 24 hours) at 10/15/2023 1008 Last data filed at 10/15/2023 0755 Gross per 24 hour  Intake 420 ml  Output --  Net 420 ml     Pressure Injury 10/06/23 Buttocks Right;Left;Mid Stage 1 -  Intact skin with non-blanchable redness of a localized area usually over a bony prominence. (Active)   10/06/23 2000  Location: Buttocks  Location Orientation: Right;Left;Mid  Staging: Stage 1 -  Intact skin with non-blanchable redness of a localized area usually over a bony prominence.  Wound Description (Comments):   Present on Admission: Yes    Physical Exam: Vital Signs Blood pressure 102/70, pulse (!) 55, temperature 97.6 F (36.4 C), resp. rate 16, height 5\' 11"  (1.803 m), weight 75.6 kg, SpO2 94%. Gen: no distress, normal appearing HEENT: oral mucosa pink and moist, NCAT Cardio: Bradycardic Chest: normal effort, normal rate of breathing Abd: soft, non-distended Ext: no edema Psych: pleasant, normal affect Genitourinary:    Comments: Has condom cath in place draining amber colored urine Musculoskeletal:     Cervical back: Normal range of motion.     Right lower leg: No edema.     Left lower leg: No edema.     Comments: Mild right hand forearm edema. Right shoulder tender with minimal flexion, IR/ER. Mild shoulder tenderness to touch. Low back tender with bed mobility, flexion/rotation.   Skin:    Comments: Diffuse bruising/ecchymoses as well as skin lacs and abrasions of various stages on all 4 limbs. Right forearm with mild erythema, no warmth--doesn't look a whole lot different than left forearm. A few abrasions dressed.  Neurological:     Mental Status: He is alert and oriented to person, place, and time.     Comments: Pt is alert and oriented to person, place, month, year. Thought it was the 18th. CN exam non-focal. Fair insight and awareness. Able to provide some biographical information. Focus improved. MMT: RUE limited by shoulder pain 2-3/5 prox to 4/5 distally. LUE grossly 4- to 4+/5. RLE and LLE-- 3/5 HF, 3+ KE with pain compontnents. 4+/5 ADF/PF. Found no consistent sensory loss. No cerebellar signs or abnormal reflexes.   Psychiatric:        Mood and Affect: Mood normal.  Behavior: Behavior normal.     Comments: Pt is pleasant and cooperative        Assessment/Plan: 1. Functional deficits which require 3+ hours per day of interdisciplinary therapy in a comprehensive inpatient rehab setting. Physiatrist is providing close team supervision and 24 hour management of active medical problems listed below. Physiatrist and rehab team continue to assess barriers to discharge/monitor patient progress toward functional and medical goals  Care Tool:  Bathing    Body parts bathed by patient: Right arm, Left arm, Chest, Abdomen, Front perineal area, Face   Body parts bathed by helper: Buttocks, Right upper leg, Left upper leg, Right lower leg, Left lower leg     Bathing assist Assist Level: 2 Helpers     Upper Body Dressing/Undressing Upper body dressing   What is the patient wearing?: Pull over shirt    Upper body assist Assist Level: Minimal Assistance - Patient > 75%    Lower Body Dressing/Undressing Lower body dressing            Lower body assist Assist for lower body dressing: 2 Helpers     Toileting Toileting    Toileting assist Assist for toileting: 2 Helpers     Transfers Chair/bed transfer  Transfers assist     Chair/bed transfer assist level: 2 Helpers     Locomotion Ambulation   Ambulation assist      Assist level: 2 helpers Assistive device: Walker-rolling Max distance: 31ft   Walk 10 feet activity   Assist     Assist level: 2 helpers Assistive device: Walker-rolling   Walk 50 feet activity   Assist Walk 50 feet with 2 turns activity did not occur: Safety/medical concerns         Walk 150 feet activity   Assist Walk 150 feet activity did not occur: Safety/medical concerns         Walk 10 feet on uneven surface  activity   Assist Walk 10 feet on uneven surfaces activity did not occur: Safety/medical concerns         Wheelchair     Assist Is the patient using a wheelchair?: Yes Type of Wheelchair: Manual    Wheelchair assist level: Dependent - Patient  0%      Wheelchair 50 feet with 2 turns activity    Assist        Assist Level: Dependent - Patient 0%   Wheelchair 150 feet activity     Assist      Assist Level: Dependent - Patient 0%   Blood pressure 102/70, pulse (!) 55, temperature 97.6 F (36.4 C), resp. rate 16, height 5\' 11"  (1.803 m), weight 75.6 kg, SpO2 94%.  Medical Problem List and Plan: 1. Functional deficits secondary to acute encephalopathy d/t NPH             -patient may shower             -ELOS/Goals: 21-24 days, supervision to min assist with PT, OT, SLP  Grounds pass ordered   2.  Antithrombotics: -DVT/anticoagulation:  Pharmaceutical: continue Lovenox             -antiplatelet therapy: Plavix    3. Low back pain: kpad ordered. Tylenol as needed, asked nursing if kpad has been delivered   4.Anxiety: continue Xanax 0.5 mg every 6 hours as needed             -continue triazolam 0.25 mg daily at bedtime             -  continue melatonin 3 mg daily at bedtime             -continue trazodone 50 mg daily at bedtime             -antipsychotic agents: n/a             -keep sleep chart 5. Neuropsych/cognition: This patient is  capable of making decisions on his own behalf.   6. Skin/Wound Care: Routine skin care checks             -local care as needed to multiple lacs/abrasions/bruises 7. 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 Lovell Sheehan)             -follows with [facility] (11/26/2023)   11: COPD exacerbation: continue Breo and Incruse Ellipta, Tessalon   12: UTI: mixed flora              -completed course of cefuroxime - end 3/18   13: History of  parapneumonic effusion, s/p LUL biopsy             -follows with Dr. Dorris Fetch and plan surveillance CT in 2-3 months   14: Tobacco use: Decrease patch to as nicotine can worsen back pain   15: AKI atop ? CKD: BUN/Cr currently WNL             -follow-up BMP   16: Thrombocytopenia: improving -Follow-up CBC.   17: Right shoulder pain with glenohumeral joint effusion s/p aspiration 1mL joint fluid             - jt fluid with 1800 wbc's and neutrophils 50%, and calcium pyrophosphate crystals; gram stain with rare wbc's, cx pending. 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             -will try voltaren gel initially             -consider steroid injection depending upon course             -?outpt f/u with shoulder specialist.   18: Right forearm cellulitis: fever resolved, no leukocytosis -continue cefafroxil 500 mg BID for 10 doses (thru 3/23)  19. Bradycardic: d/c atenolol     LOS: 2 days A FACE TO FACE EVALUATION WAS PERFORMED  Nataleah Scioneaux P Kinsie Belford 10/15/2023, 10:08 AM

## 2023-10-16 LAB — RENAL FUNCTION PANEL
Albumin: 2.2 g/dL — ABNORMAL LOW (ref 3.5–5.0)
Anion gap: 7 (ref 5–15)
BUN: 23 mg/dL (ref 8–23)
CO2: 21 mmol/L — ABNORMAL LOW (ref 22–32)
Calcium: 9 mg/dL (ref 8.9–10.3)
Chloride: 109 mmol/L (ref 98–111)
Creatinine, Ser: 1.16 mg/dL (ref 0.61–1.24)
GFR, Estimated: >60 mL/min (ref >60)
Glucose, Bld: 132 mg/dL — ABNORMAL HIGH (ref 70–99)
Phosphorus: 2.7 mg/dL (ref 2.5–4.6)
Potassium: 4.1 mmol/L (ref 3.5–5.1)
Sodium: 137 mmol/L (ref 135–145)

## 2023-10-16 LAB — BODY FLUID CULTURE W GRAM STAIN

## 2023-10-16 MED ORDER — NICOTINE 7 MG/24HR TD PT24
7.0000 mg | MEDICATED_PATCH | Freq: Every day | TRANSDERMAL | Status: DC
  Administered 2023-10-17 – 2023-10-19 (×3): 7 mg via TRANSDERMAL
  Filled 2023-10-16 (×3): qty 1

## 2023-10-16 NOTE — Progress Notes (Signed)
 Occupational Therapy Session Note  Patient Details  Name: Juan Brewer MRN: [id number] Date of Birth: 27-Apr-1946  Today's Date: 10/16/2023 OT Individual Time: 4098-1191 OT Individual Time Calculation (min): 72 min    Skilled Therapeutic Interventions/Progress Updates:    Patient received supine in bed.  Patient agreeable to shower!  Able to partially stand step transfer to wheelchair.  Upgraded safety plan to indicate use of sit to stand assist transfer.  Patient able to effectively use grab bar to pull to stand, then stand step transfer to tub transfer bench.  Patient able to bathe most body parts while seated in shower.  Patient sat at sink to dress himself.  Patient states "I need to be in bed" to don brief, socks, pants.  Patient actually able to dress himself with moderate assistance.  Patient transported to gym to address sit to stand, stand tolerance, and functional sorting task.   Left up in wheelchair to build tolerance to upright.  Safety belt in place and engaged and call bell / personal items in reach.    Therapy Documentation Precautions:  Precautions Precautions: Fall Precaution/Restrictions Comments: Fear of Falling Restrictions Weight Bearing Restrictions Per Provider Order: No   Pain:  Denies pain- reports intermittent back pain    Therapy/Group: Individual Therapy  Collier Salina 10/16/2023, 12:28 PM

## 2023-10-16 NOTE — Progress Notes (Signed)
 Physical Therapy Session Note  Patient Details  Name: Juan Brewer MRN: [id number] Date of Birth: 1946-02-23  Today's Date: 10/16/2023 PT Individual Time: 1330-1440 PT Individual Time Calculation (min): 70 min   Short Term Goals: Week 1:  PT Short Term Goal 1 (Week 1): Pt will complete bed mobility with modA PT Short Term Goal 2 (Week 1): Pt will complete bed<>chair transfers with modA PT Short Term Goal 3 (Week 1): Pt will ambulate 19ft with modA and LRAD PT Short Term Goal 4 (Week 1): Pt will tolerate sitting OOB for > 1 hour between therapy sessions  Skilled Therapeutic Interventions/Progress Updates:      Pt in bed to start - his wife and granddaughter present throughout session to aid encouragement and participation.  Pt requests his Xanax and cough medicine - relayed to his LPN.   Supine<>sitting EOB with minA with ++ time for processing and initiation. Completed squat pivot transfer with mod/maxA into TIS w/c with ++ time for processing and initiation with cues for hand placement and setup.  Wheeled down to the main rehab gym and placed outside // bars to Research officer, trade union. He completed a total of x5 sit<>stands with just minA for standing (continues to require ++ time for processing/initiation) and able to stand for up to 15 seconds. Added standing marching x5 reps bilaterally with minA for balance.   Progressed to gait training in the // bars. Completed forward/backward ambulation 4x20ft with seated rest breaks and minA overall for balance. He then completed lateral stepping L<>R 3x23ft with seated rest breaks and minA. Frequent cueing for upright and postural awareness.   Pt returned to his room at end of treatment. Assisted back to bed with a modA squat pivot transfer (was max/totalA yesterday). Requires assist for repositioning in bed - tennis shoes removed and all needs met at end with alarm on. Family present.   Therapy Documentation Precautions:  Precautions Precautions:  Fall Precaution/Restrictions Comments: Fear of Falling Restrictions Weight Bearing Restrictions Per Provider Order: No General:     Therapy/Group: Individual Therapy  Brizeida Mcmurry P Valgene Deloatch 10/16/2023, 7:45 AM

## 2023-10-16 NOTE — IPOC Note (Signed)
 Overall Plan of Care [facility]) Patient Details Name: Juan Brewer MRN: [id number] DOB: [birth year]  Admitting Diagnosis: Encephalopathy  Hospital Problems: Principal Problem:   Encephalopathy     Functional Problem List: Nursing Pain, Bowel, Bladder, Safety, Endurance, Medication Management, Skin Integrity  PT Balance, Endurance, Motor, Pain, Perception, Safety, Sensory, Skin Integrity  OT Balance, Safety, Cognition, Behavior, Perception, Sensory, Edema, Skin Integrity, Endurance, Vision, Motor, Pain  SLP Cognition, Nutrition  TR         Basic ADL's: OT Bathing, Dressing, Toileting, Grooming     Advanced  ADL's: OT       Transfers: PT Bed Mobility, Bed to Chair, Customer service manager, Tub/Shower     Locomotion: PT Ambulation, Stairs, Wheelchair Mobility     Additional Impairments: OT None  SLP Swallowing, Social Cognition   Problem Solving, Memory, Attention, Awareness  TR      Anticipated Outcomes Item Anticipated Outcome  Self Feeding (S)  Swallowing  modI   Basic self-care  min A  Toileting  min A   Bathroom Transfers min A  Bowel/Bladder  manage bowel w mod I and bladder w toileting  Transfers  minA  Locomotion  short distance minA with RW (anticipate wheelchair level)  Communication     Cognition  min assist  Pain  Pain < 4 with prns  Safety/Judgment  manage w cues   Therapy Plan: PT Intensity: Minimum of 1-2 x/day ,45 to 90 minutes PT Frequency: 5 out of 7 days PT Duration Estimated Length of Stay: 3 weeks OT Intensity: Minimum of 1-2 x/day, 45 to 90 minutes OT Frequency: 5 out of 7 days OT Duration/Estimated Length of Stay: 3 weeks SLP Intensity: Minumum of 1-2 x/day, 30 to 90 minutes SLP Frequency: 3 to 5 out of 7 days SLP Duration/Estimated Length of Stay: 3-3.5 weeks   Team Interventions: Nursing Interventions Patient/Family Education, Pain Management, Medication Management, Bladder Management, Bowel Management, Discharge Planning,  Disease Management/Prevention, Skin Care/Wound Management  PT interventions Ambulation/gait training, Discharge planning, Functional mobility training, Psychosocial support, Therapeutic Activities, Visual/perceptual remediation/compensation, Wheelchair propulsion/positioning, Therapeutic Exercise, Skin care/wound management, Neuromuscular re-education, Balance/vestibular training, Disease management/prevention, Cognitive remediation/compensation, DME/adaptive equipment instruction, Pain management, Splinting/orthotics, UE/LE Strength taining/ROM, UE/LE Coordination activities, Stair training, Patient/family education, Community reintegration, Development worker, international aid stimulation  OT Interventions Warden/ranger, Discharge planning, Pain management, Self Care/advanced ADL retraining, Therapeutic Activities, UE/LE Coordination activities, Therapeutic Exercise, Visual/perceptual remediation/compensation, Skin care/wound managment, Patient/family education, Functional mobility training, Cognitive remediation/compensation, Firefighter, Wheelchair propulsion/positioning, UE/LE Strength taining/ROM, Psychosocial support, Neuromuscular re-education, DME/adaptive equipment instruction  SLP Interventions Dysphagia/aspiration precaution training, Patient/family education, Cognitive remediation/compensation, Environmental controls, Cueing hierarchy, Functional tasks, Therapeutic Activities  TR Interventions    SW/CM Interventions Discharge Planning, Psychosocial Support, Patient/Family Education   Barriers to Discharge MD  Medical stability  Nursing Decreased caregiver support, Incontinence 2 level 1 ste, right rail w spouse, spouse notes patient has had some falls, pt denied, has DME  PT Decreased caregiver support, Home environment Best boy, Insurance for SNF coverage, Incontinence, Lack of/limited family support, Behavior    OT      SLP      SW       Team Discharge  Planning: Destination: PT-Home ,OT- Home , SLP-Home Projected Follow-up: PT-Home health PT, 24 hour supervision/assistance, OT-  Home health OT, SLP-Home Health SLP, 24 hour supervision/assistance Projected Equipment Needs: PT-To be determined, OT- To be determined, SLP-None recommended by SLP Equipment Details: PT- , OT-  Patient/family involved in discharge planning: PT- Patient,  OT-Patient, SLP-Patient  MD ELOS: 21-24 days Medical Rehab Prognosis:  Excellent Assessment: The patient has been admitted for CIR therapies with the diagnosis of encephalopathy 2/2 NPH. The team will be addressing functional mobility, strength, stamina, balance, safety, adaptive techniques and equipment, self-care, bowel and bladder mgt, patient and caregiver education. Goals have been set at 21-24 days. Anticipated discharge destination is home.        See Team Conference Notes for weekly updates to the plan of care

## 2023-10-16 NOTE — Progress Notes (Signed)
 Speech Language Pathology Daily Session Note  Patient Details  Name: Whyatt Klinger MRN: [id number] Date of Birth: 08/15/1945  Today's Date: 10/16/2023 SLP Individual Time: 0800-0900 SLP Individual Time Calculation (min): 60 min  Short Term Goals: Week 1: SLP Short Term Goal 1 (Week 1): Patient will orient to date and time utilizing available external aids given mod verbal cues. SLP Short Term Goal 2 (Week 1): Patient will recall daily events with 75% accuracy given mod verbal cues. SLP Short Term Goal 3 (Week 1): Patient will demonstrate intellectual awareness of deficits by stating 2 cognitive and 2 physical deficits given mod verbal cues. SLP Short Term Goal 4 (Week 1): Patient will attend to functional therapy tasks for 15 minutes given mod verbal cues. SLP Short Term Goal 5 (Week 1): Patient will solve basic functional problems with 75% accuracy given mod verbal cues. SLP Short Term Goal 6 (Week 1): Patient will tolerate regular/thin liquid diet during clinical diet tolerance assessments without overt s/sx concerning for aspiration.  Skilled Therapeutic Interventions:   Pt greeted at bedside. He was awake/alert upon SLP arrival and pleasant throughout tx tasks targeting cognition. SLP facilitated tx tasks targeting cognition and dysphagia. He benefited from modA cues to utilize his calendar and provide current orientation information. He was able to ID reason for stay independently. Pt's RNT then returned for rounding and brief change was completed d/t incontinence of bladder. He benefited from [facility] verbal/tactile cues to follow one step commands during peri care and LBD. After brief change and repositioning, he was observed with morning meal. Morning meal comprised of regular textures/thin liquids. Pt presented w/ large coughing episode x2 w/ regular textures, even coughing up pieces of partially masticated food during the second episode. Anticipate attention and large bites negatively  impacted success, however, pt would benefit from further evaluation of his swallow. Recommend MBSS early next week (3/24 or 3/25) to further evaluation oropharyngeal swallow function and r/o aspiration. Pt downgraded to Dys 2 textures in the meantime for pt safety given potential choking risk at this time. After breakfast, he completed a written organization task sorting items into 2 categories. He benefited from s cues for attention to detail. He also required maxA cues overall to recall recent information and identify reason for therapies during final conversation. At the end of tx tasks, he was left in his bed with the alarm set and call light within reach. Recommend cont ST per POC.   Pain  No pain reported   Therapy/Group: Individual Therapy  Pati Gallo 10/16/2023, 8:25 AM

## 2023-10-16 NOTE — Progress Notes (Signed)
 PROGRESS NOTE   Subjective/Complaints: No new complaints this morning  Kpad is helping back pain IPOC completed Patient's chart reviewed- No issues reported overnight Vitals signs stable    ROS: +low back pain--improved  Objective:   No results found.  Recent Labs    10/14/23 0518  WBC 9.3  HGB 11.6*  HCT 34.7*  PLT 154   Recent Labs    10/15/23 0612 10/16/23 0818  NA 135 137  K 3.4* 4.1  CL 107 109  CO2 24 21*  GLUCOSE 119* 132*  BUN 26* 23  CREATININE 1.19 1.16  CALCIUM 8.6* 9.0    Intake/Output Summary (Last 24 hours) at 10/16/2023 1016 Last data filed at 10/15/2023 1753 Gross per 24 hour  Intake 480 ml  Output --  Net 480 ml     Pressure Injury 10/06/23 Buttocks Right;Left;Mid Stage 1 -  Intact skin with non-blanchable redness of a localized area usually over a bony prominence. (Active)  10/06/23 2000  Location: Buttocks  Location Orientation: Right;Left;Mid  Staging: Stage 1 -  Intact skin with non-blanchable redness of a localized area usually over a bony prominence.  Wound Description (Comments):   Present on Admission: Yes    Physical Exam: Vital Signs Blood pressure 130/70, pulse 67, temperature 97.8 F (36.6 C), temperature source Oral, resp. rate 16, height 5\' 11"  (1.803 m), weight 75.6 kg, SpO2 94%. Gen: no distress, normal appearing HEENT: oral mucosa pink and moist, NCAT Cardio: Bradycardic Chest: normal effort, normal rate of breathing Abd: soft, non-distended Ext: no edema Psych: pleasant, normal affect Genitourinary:    Comments: Has condom cath in place draining amber colored urine Musculoskeletal:     Cervical back: Normal range of motion.     Right lower leg: No edema.     Left lower leg: No edema.     Comments: Mild right hand forearm edema. Right shoulder tender with minimal flexion, IR/ER. Mild shoulder tenderness to touch. Low back tender with bed mobility,  flexion/rotation.   Skin:    Comments: Diffuse bruising/ecchymoses as well as skin lacs and abrasions of various stages on all 4 limbs. Right forearm with mild erythema, no warmth--doesn't look a whole lot different than left forearm. A few abrasions dressed.  Neurological:     Mental Status: He is alert and oriented to person, place, and time.     Comments: Pt is alert and oriented to person, place, month, year. Thought it was the 18th. CN exam non-focal. Fair insight and awareness. Able to provide some biographical information. Focus improved. MMT: RUE limited by shoulder pain 3/5 to 4/5 distally. LUE grossly 4- to 4+/5. RLE and LLE-- 3/5 HF, 3+ KE with pain compontnents. 4+/5 ADF/PF. Found no consistent sensory loss. No cerebellar signs or abnormal reflexes.  Improved as above 3/21 Psychiatric:        Mood and Affect: Mood normal.        Behavior: Behavior normal.     Comments: Pt is pleasant and cooperative       Assessment/Plan: 1. Functional deficits which require 3+ hours per day of interdisciplinary therapy in a comprehensive inpatient rehab setting. Physiatrist is providing close team supervision and  24 hour management of active medical problems listed below. Physiatrist and rehab team continue to assess barriers to discharge/monitor patient progress toward functional and medical goals  Care Tool:  Bathing    Body parts bathed by patient: Right arm, Left arm, Chest, Abdomen, Front perineal area, Face   Body parts bathed by helper: Buttocks, Right upper leg, Left upper leg, Right lower leg, Left lower leg     Bathing assist Assist Level: 2 Helpers     Upper Body Dressing/Undressing Upper body dressing   What is the patient wearing?: Pull over shirt    Upper body assist Assist Level: Minimal Assistance - Patient > 75%    Lower Body Dressing/Undressing Lower body dressing            Lower body assist Assist for lower body dressing: 2 Helpers      Toileting Toileting    Toileting assist Assist for toileting: 2 Helpers     Transfers Chair/bed transfer  Transfers assist     Chair/bed transfer assist level: 2 Helpers     Locomotion Ambulation   Ambulation assist      Assist level: 2 helpers Assistive device: Walker-rolling Max distance: 9ft   Walk 10 feet activity   Assist     Assist level: 2 helpers Assistive device: Walker-rolling   Walk 50 feet activity   Assist Walk 50 feet with 2 turns activity did not occur: Safety/medical concerns         Walk 150 feet activity   Assist Walk 150 feet activity did not occur: Safety/medical concerns         Walk 10 feet on uneven surface  activity   Assist Walk 10 feet on uneven surfaces activity did not occur: Safety/medical concerns         Wheelchair     Assist Is the patient using a wheelchair?: Yes Type of Wheelchair: Manual    Wheelchair assist level: Dependent - Patient 0%      Wheelchair 50 feet with 2 turns activity    Assist        Assist Level: Dependent - Patient 0%   Wheelchair 150 feet activity     Assist      Assist Level: Dependent - Patient 0%   Blood pressure 130/70, pulse 67, temperature 97.8 F (36.6 C), temperature source Oral, resp. rate 16, height 5\' 11"  (1.803 m), weight 75.6 kg, SpO2 94%.  Medical Problem List and Plan: 1. Functional deficits secondary to acute encephalopathy d/t NPH             -patient may shower             -ELOS/Goals: 21-24 days, supervision to min assist with PT, OT, SLP  Grounds pass ordered   2.  Antithrombotics: -DVT/anticoagulation:  Pharmaceutical: continue Lovenox             -antiplatelet therapy: Plavix    3. Low back pain: kpad ordered. Tylenol as needed, asked nursing if kpad has been delivered   4.Anxiety: continue Xanax 0.5 mg every 6 hours as needed             -continue triazolam 0.25 mg daily at bedtime             -continue melatonin 3 mg  daily at bedtime             -continue trazodone 50 mg daily at bedtime             -antipsychotic agents:  n/a             -keep sleep chart 5. Neuropsych/cognition: This patient is  capable of making decisions on his own behalf.   6. Skin/Wound Care: Routine skin care checks             -local care as needed to multiple lacs/abrasions/bruises 7. 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 Dorris Fetch and plan surveillance CT in 2-3 months   14: Tobacco use: Decrease patch to as nicotine can worsen back pain   15: AKI: Cr reviewed and has resolved   16: Thrombocytopenia: reviewed and has resolved   17: Right shoulder pain with glenohumeral joint effusion s/p aspiration 1mL joint fluid             - jt fluid with 1800 wbc's and neutrophils 50%, and calcium pyrophosphate crystals; gram stain with rare wbc's, cx pending. 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             -will try voltaren gel initially             -consider steroid injection depending upon course             -?outpt f/u with shoulder specialist.   18: Right forearm cellulitis: fever resolved, no leukocytosis -continue cefafroxil 500 mg  BID for 10 doses (thru 3/23)  19. Bradycardic: d/c atenolol, HR reviewed and has improved     LOS: 3 days A FACE TO FACE EVALUATION WAS PERFORMED  Clint Bolder P Attila Mccarthy 10/16/2023, 10:16 AM

## 2023-10-17 DIAGNOSIS — K5901 Slow transit constipation: Secondary | ICD-10-CM | POA: Diagnosis not present

## 2023-10-17 DIAGNOSIS — I10 Essential (primary) hypertension: Secondary | ICD-10-CM | POA: Diagnosis not present

## 2023-10-17 LAB — RENAL FUNCTION PANEL
Albumin: 2.4 g/dL — ABNORMAL LOW (ref 3.5–5.0)
Anion gap: 6 (ref 5–15)
BUN: 19 mg/dL (ref 8–23)
CO2: 23 mmol/L (ref 22–32)
Calcium: 9.3 mg/dL (ref 8.9–10.3)
Chloride: 108 mmol/L (ref 98–111)
Creatinine, Ser: 0.98 mg/dL (ref 0.61–1.24)
GFR, Estimated: >60 mL/min (ref >60)
Glucose, Bld: 129 mg/dL — ABNORMAL HIGH (ref 70–99)
Phosphorus: 2.5 mg/dL (ref 2.5–4.6)
Potassium: 3.8 mmol/L (ref 3.5–5.1)
Sodium: 137 mmol/L (ref 135–145)

## 2023-10-17 LAB — ANAEROBIC CULTURE W GRAM STAIN

## 2023-10-17 LAB — CULTURE, BLOOD (ROUTINE X 2)
Culture: NO GROWTH
Culture: NO GROWTH
Special Requests: ADEQUATE
Special Requests: ADEQUATE

## 2023-10-17 MED ORDER — DOCUSATE SODIUM 100 MG PO CAPS
100.0000 mg | ORAL_CAPSULE | Freq: Every day | ORAL | Status: DC
Start: 2023-10-17 — End: 2023-10-17

## 2023-10-17 MED ORDER — GERHARDT'S BUTT CREAM
TOPICAL_CREAM | Freq: Two times a day (BID) | CUTANEOUS | Status: DC
  Filled 2023-10-17: qty 60

## 2023-10-17 MED ORDER — LIDOCAINE 5 % EX PTCH
1.0000 | MEDICATED_PATCH | CUTANEOUS | Status: DC
  Administered 2023-10-17 – 2023-10-27 (×11): 1 via TRANSDERMAL
  Filled 2023-10-17 (×10): qty 1

## 2023-10-17 MED ORDER — POLYETHYLENE GLYCOL 3350 17 G PO PACK
17.0000 g | PACK | Freq: Every day | ORAL | Status: DC
  Administered 2023-10-17 – 2023-10-20 (×4): 17 g via ORAL
  Filled 2023-10-17 (×4): qty 1

## 2023-10-17 NOTE — Progress Notes (Signed)
 Physical Therapy Session Note  Patient Details  Name: Juan Brewer MRN: [id number] Date of Birth: 1945/08/22  Today's Date: 10/17/2023 PT Individual Time: 1300-1355 PT Individual Time Calculation (min): 55 min   Short Term Goals: Week 1:  PT Short Term Goal 1 (Week 1): Pt will complete bed mobility with modA PT Short Term Goal 2 (Week 1): Pt will complete bed<>chair transfers with modA PT Short Term Goal 3 (Week 1): Pt will ambulate 46ft with modA and LRAD PT Short Term Goal 4 (Week 1): Pt will tolerate sitting OOB for > 1 hour between therapy sessions  Skilled Therapeutic Interventions/Progress Updates:   Received pt semi-reclined in bed using urinal. Nutrition services present to collect dinner order - assisted pt with selecting from options. Pt agreeable to PT treatment and denied any pain during session. Session with emphasis on functional mobility/transfers, generalized strengthening and endurance, dynamic standing balance/coordination, and gait training. Pt transferred semi-reclined<>sitting R EOB with HOB elevated with CGA and multimodal cues for sequencing when allowed time. Pt required significantly increased time for initiation and processing directions throughout session.   Donned shoes with max A and transferred into TIS WC via squat<>pivot with min A +2 (due to availability), again with cues for sequencing. Pt requested to brush teeth and sat in TIS WC at sink and brushed teeth with supervision. Pt transported to/from room in TIS WC dependently. Stood with RW and light mod A x 3 trials. Pt ambulated 12ft x 1 and 35ft x 2 with RW and min A +2 for WC follow. Pt required maximal encouragement to stand and walk with biggest limitation being fatigue. Placed cone for external target to increase ambulation distance. [name] arrived to encourage pt throughout session and bribe him with treats. Pt refused any further ambulation and returned to room. Pt insisting on returning to bed -  offered either TIS WC or recliner with pt not wanting to sit in either. Educated pt on importance of OOB mobility and encouraged pt to stay up to eat lunch and socialize with wife - pt's wife also in agreement to having pt remain OOB. Concluded session with pt semi-reclined in TIS WC, needs within reach, and seatbelt alarm on. Wife present at bedside.   Therapy Documentation Precautions:  Precautions Precautions: Fall Precaution/Restrictions Comments: Fear of Falling Restrictions Weight Bearing Restrictions Per Provider Order: No  Therapy/Group: Individual Therapy Marlana Salvage Zaunegger Blima Rich PT, DPT 10/17/2023, 7:14 AM

## 2023-10-17 NOTE — Progress Notes (Signed)
 PROGRESS NOTE   Subjective/Complaints:  Pt doing well,  slept well, denies pain currently but OT states his low back pain limits his participation at times. Agreeable with trying lidoderm. LBM 3-4 days ago per pt but looks like he had a BM yesterday. Urinating ok. No other complaints or concerns.    ROS: +low back pain--improved but variable, per OT it limits his participation  Objective:   No results found.  No results for input(s): "WBC", "HGB", "HCT", "PLT" in the last 72 hours.  Recent Labs    10/16/23 0818 10/17/23 0545  NA 137 137  K 4.1 3.8  CL 109 108  CO2 21* 23  GLUCOSE 132* 129*  BUN 23 19  CREATININE 1.16 0.98  CALCIUM 9.0 9.3    Intake/Output Summary (Last 24 hours) at 10/17/2023 1138 Last data filed at 10/17/2023 0908 Gross per 24 hour  Intake 118 ml  Output 850 ml  Net -732 ml     Pressure Injury 10/06/23 Buttocks Right;Left;Mid Stage 1 -  Intact skin with non-blanchable redness of a localized area usually over a bony prominence. (Active)  10/06/23 2000  Location: Buttocks  Location Orientation: Right;Left;Mid  Staging: Stage 1 -  Intact skin with non-blanchable redness of a localized area usually over a bony prominence.  Wound Description (Comments):   Present on Admission: Yes    Physical Exam: Vital Signs Blood pressure (!) 140/76, pulse 64, temperature 97.9 F (36.6 C), temperature source Oral, resp. rate 18, height 5\' 11"  (1.803 m), weight 75.6 kg, SpO2 94%.  Gen: no distress, normal appearing, laying in bed working with OT HEENT: oral mucosa pink and moist, NCAT Cardio: RRR, no m/r/g appreciated Chest: normal effort, normal rate of breathing, CTAB Abd: soft, non-distended, nonTTP, +BS throughout Ext: no edema Psych: pleasant, normal affect   PRIOR EXAMS: Musculoskeletal:     Cervical back: Normal range of motion.     Right lower leg: No edema.     Left lower leg: No edema.      Comments: Mild right hand forearm edema. Right shoulder tender with minimal flexion, IR/ER. Mild shoulder tenderness to touch. Low back tender with bed mobility, flexion/rotation.   Skin:    Comments: Diffuse bruising/ecchymoses as well as skin lacs and abrasions of various stages on all 4 limbs. Right forearm with mild erythema, no warmth--doesn't look a whole lot different than left forearm. A few abrasions dressed.  Neurological:     Mental Status: He is alert and oriented to person, place, and time.     Comments: Pt is alert and oriented to person, place, month, year. Thought it was the 18th. CN exam non-focal. Fair insight and awareness. Able to provide some biographical information. Focus improved. MMT: RUE limited by shoulder pain 3/5 to 4/5 distally. LUE grossly 4- to 4+/5. RLE and LLE-- 3/5 HF, 3+ KE with pain compontnents. 4+/5 ADF/PF. Found no consistent sensory loss. No cerebellar signs or abnormal reflexes.  Improved as above 3/21 Psychiatric:        Mood and Affect: Mood normal.        Behavior: Behavior normal.     Comments: Pt is pleasant and cooperative  Assessment/Plan: 1. Functional deficits which require 3+ hours per day of interdisciplinary therapy in a comprehensive inpatient rehab setting. Physiatrist is providing close team supervision and 24 hour management of active medical problems listed below. Physiatrist and rehab team continue to assess barriers to discharge/monitor patient progress toward functional and medical goals  Care Tool:  Bathing    Body parts bathed by patient: Right arm, Left arm, Chest, Abdomen, Front perineal area, Face, Right upper leg, Left upper leg, Right lower leg, Left lower leg   Body parts bathed by helper: Buttocks     Bathing assist Assist Level: Minimal Assistance - Patient > 75%     Upper Body Dressing/Undressing Upper body dressing   What is the patient wearing?: Pull over shirt    Upper body assist Assist Level: Set up  assist    Lower Body Dressing/Undressing Lower body dressing      What is the patient wearing?: Pants, Incontinence brief     Lower body assist Assist for lower body dressing: Minimal Assistance - Patient > 75%     Toileting Toileting Toileting Activity did not occur (Clothing management and hygiene only): N/A (no void or bm)  Toileting assist Assist for toileting: 2 Helpers     Transfers Chair/bed transfer  Transfers assist     Chair/bed transfer assist level: Minimal Assistance - Patient > 75%     Locomotion Ambulation   Ambulation assist      Assist level: 2 helpers Assistive device: Walker-rolling Max distance: 51ft   Walk 10 feet activity   Assist     Assist level: 2 helpers Assistive device: Walker-rolling   Walk 50 feet activity   Assist Walk 50 feet with 2 turns activity did not occur: Safety/medical concerns         Walk 150 feet activity   Assist Walk 150 feet activity did not occur: Safety/medical concerns         Walk 10 feet on uneven surface  activity   Assist Walk 10 feet on uneven surfaces activity did not occur: Safety/medical concerns         Wheelchair     Assist Is the patient using a wheelchair?: Yes Type of Wheelchair: Manual    Wheelchair assist level: Dependent - Patient 0%      Wheelchair 50 feet with 2 turns activity    Assist        Assist Level: Dependent - Patient 0%   Wheelchair 150 feet activity     Assist      Assist Level: Dependent - Patient 0%   Blood pressure (!) 140/76, pulse 64, temperature 97.9 F (36.6 C), temperature source Oral, resp. rate 18, height 5\' 11"  (1.803 m), weight 75.6 kg, SpO2 94%.  Medical Problem List and Plan: 1. Functional deficits secondary to acute encephalopathy d/t NPH             -patient may shower             -ELOS/Goals: 21-24 days, supervision to min assist with PT, OT, SLP  Grounds pass ordered  -Continue CIR   2.   Antithrombotics: -DVT/anticoagulation:  Pharmaceutical: continue Lovenox 40mg  daily             -antiplatelet therapy: Plavix 75mg  daily   3. Low back pain: kpad ordered. Tylenol as needed, asked nursing if kpad has been delivered  -10/17/23 apparently low back pain limits participation; will start lidoderm   4.Anxiety: continue Xanax 0.5 mg every 6 hours as  needed             -continue triazolam 0.25 mg daily at bedtime             -continue melatonin 3 mg daily at bedtime             -continue trazodone 50 mg daily at bedtime             -antipsychotic agents: n/a             -keep sleep chart 5. Neuropsych/cognition: This patient is  capable of making decisions on his own behalf.   6. Skin/Wound Care: Routine skin care checks             -local care as needed to multiple lacs/abrasions/bruises 7. Fluids/Electrolytes/Nutrition: Routine Is and Os and follow-up chemistries             -pt reports good appetite although recorded intake is minimal on flow sheets             -check prealbumin with AM labs 8: Hypertension: monitor TID and prn             -continue atenolol 25 mg daily   -10/17/23 BPs pretty good, monitor  Vitals:   10/13/23 1331 10/13/23 1947 10/14/23 0400 10/14/23 1300  BP: 115/71 105/67 118/73 (!) 101/59   10/14/23 1924 10/15/23 0412 10/15/23 1441 10/15/23 1821  BP: 111/66 102/70 104/75 105/67   10/16/23 0418 10/16/23 1334 10/16/23 2014 10/17/23 0503  BP: 130/70 114/76 132/80 (!) 140/76    9: Hyperlipidemia: continue simvastatin 40mg  daily   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 Dorris Fetch and plan surveillance CT in 2-3 months   14: Tobacco use: Decrease patch  to as nicotine can worsen back pain   15: AKI: Cr reviewed and has resolved, stable 10/17/23   16: Thrombocytopenia: reviewed and has resolved   17: Right shoulder pain with glenohumeral joint effusion s/p aspiration 1mL joint fluid - jt fluid with 1800 wbc's and neutrophils 50%, and calcium pyrophosphate crystals; gram stain with rare wbc's, cx pending. 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             -will try voltaren gel initially             -consider steroid injection depending upon course             -?outpt f/u with shoulder specialist.   18: Right forearm cellulitis: fever resolved, no leukocytosis -continue cefadroxil 500 mg BID for 10 doses (thru 3/23)  19. Bradycardic: d/c atenolol, HR reviewed and has improved  20. Bowel management: -10/17/23 pt stating last good BM was 3-4 days ago, but looks like LBM was yesterday; will start miralax daily for now, monitor stool consistency, if too loose  will back off.      LOS: 4 days A FACE TO FACE EVALUATION WAS PERFORMED  [id number] 10/17/2023, 11:38 AM

## 2023-10-17 NOTE — Progress Notes (Signed)
 Speech Language Pathology Daily Session Note  Patient Details  Name: Juan Brewer MRN: [id number] Date of Birth: [birth year]  Today's Date: 10/17/2023 SLP Individual Time: 1000-1058 SLP Individual Time Calculation (min): 58 min  Short Term Goals: Week 1: SLP Short Term Goal 1 (Week 1): Patient will orient to date and time utilizing available external aids given mod verbal cues. SLP Short Term Goal 2 (Week 1): Patient will recall daily events with 75% accuracy given mod verbal cues. SLP Short Term Goal 3 (Week 1): Patient will demonstrate intellectual awareness of deficits by stating 2 cognitive and 2 physical deficits given mod verbal cues. SLP Short Term Goal 4 (Week 1): Patient will attend to functional therapy tasks for 15 minutes given mod verbal cues. SLP Short Term Goal 5 (Week 1): Patient will solve basic functional problems with 75% accuracy given mod verbal cues. SLP Short Term Goal 6 (Week 1): Patient will tolerate regular/thin liquid diet during clinical diet tolerance assessments without overt s/sx concerning for aspiration.  Skilled Therapeutic Interventions: Skilled therapy session focused on cognitive and dysphagia goals. SLP faciliated session by introducing self and role. Patient independently provided information about self, family, and job, though required min-modA to identify cognitive and physical changes since admission. Patient oriented to self, situation, location and month/year independently, though required minA to utilize external aid for date. SLP targeted problem solving goals through identification of problem/solution in picture cards. Patient required minA, and actively participated in task for ~20 minutes before stating "Can we be done with this? I don't want to do anything else." SLP provided encouragement and patient was agreeable to participate in PO trials of D3/thin. Patient with timely mastication, adequate oral clearance and x1 immediate cough during liquid  wash, though with intermittent coughing throughout the session. Continue current diet until MBS is completed on Monday to objectively assess oropharyngeal swallow. At conclusion of session, patient agreeable to review todays newspaper and answer comprehension questions. Patient required minA to attend to task. Patient left in bed with alarm set and call bell in reach. Continue POC.   Pain Denies  Therapy/Group: Individual Therapy  Cervando Durnin M.A., CF-SLP 10/17/2023, 7:40 AM

## 2023-10-17 NOTE — Progress Notes (Addendum)
 Occupational Therapy Session Note  Patient Details  Name: Leaman Abe MRN: [id number] Date of Birth: [birth year]  Today's Date: 10/17/2023 OT Individual Time: [phone] OT Individual Time Calculation (min): 72 min    Skilled Therapeutic Interventions/Progress Updates:    Patient received supine in bed.  Patient agreeable to get out of bed for a shower.  Breakfast tray arrived at start of session.  Sat at edge of bed x 15 min to eat breakfast tray.  Patient then needed to lie down to rest his back.  Patient able to transfer to wheelchair toward right with set up and contact guard with increased time.  Patient does best when he is allowed to move himself versus with assistance.  Transported to bathroom to shower.  Able to shower with set up assistance and occasional verbal cueing.  Transferred back to chair with set up assistance and contact guard - use of grab bar - bilateral hand use.  Able to complete grooming at sink, then dressed himself while lying in bed to rest his back.  Significant improvement noted daily.   Left supine in bed with bed alarm engaged, and call bell in reach.    Therapy Documentation Precautions:  Precautions Precautions: Fall Precaution/Restrictions Comments: Fear of Falling Restrictions Weight Bearing Restrictions Per Provider Order: No   Pain: Pain Assessment Pain Scale: 0-10 Pain Score: 0-No painReports back pain after sitting edge of bed x 15 min     Therapy/Group: Individual Therapy  Collier Salina 10/17/2023, 12:00 PM

## 2023-10-18 DIAGNOSIS — K5901 Slow transit constipation: Secondary | ICD-10-CM | POA: Diagnosis not present

## 2023-10-18 DIAGNOSIS — I10 Essential (primary) hypertension: Secondary | ICD-10-CM | POA: Diagnosis not present

## 2023-10-18 NOTE — Plan of Care (Signed)
  Problem: Consults Goal: RH GENERAL PATIENT EDUCATION Description: See Patient Education module for education specifics. Outcome: Progressing   Problem: RH BOWEL ELIMINATION Goal: RH STG MANAGE BOWEL WITH ASSISTANCE Description: STG Manage Bowel with toileting Assistance. Outcome: Progressing   Problem: RH BLADDER ELIMINATION Goal: RH STG MANAGE BLADDER WITH ASSISTANCE Description: STG Manage Bladder With toileting Assistance Outcome: Progressing   Problem: RH SKIN INTEGRITY Goal: RH STG SKIN FREE OF INFECTION/BREAKDOWN Description: Manage skin w min assist Outcome: Not Progressing   Problem: RH SAFETY Goal: RH STG ADHERE TO SAFETY PRECAUTIONS W/ASSISTANCE/DEVICE Description: STG Adhere to Safety Precautions With cues Assistance/Device. Outcome: Progressing Flowsheets (Taken 10/18/2023 1707) STG:Pt will adhere to safety precautions with assistance/device: 2-Maximum assistance

## 2023-10-18 NOTE — Consult Note (Signed)
 WOC Nurse Consult Note: Reason for Consult: sacral wound Reviewed images Wound type: Deep tissue injury; right lateral sacrum; medial sacrum; evolution since admission  Pressure Injury POA: Yes Measurement: see nursing notes Wound bed:  Partial thickness skin loss upper right sacrum Dark purple non blanchable tissue over the upper buttocks and sacrum  Drainage (amount, consistency, odor) none  Periwound: intact  Dressing procedure/placement/frequency: Cleanse with saline, pat dry Single layer of xeroform to the affected areas, top with foam Change daily Low air loss mattress for moisture management and pressure redistribution  Re consult if needed, will not follow at this time. Thanks  Liliani Bobo [employer], RN,CWOCN, CNS, CWON-AP [phone])

## 2023-10-18 NOTE — Progress Notes (Signed)
 PROGRESS NOTE   Subjective/Complaints:  Pt doing well again today,  slept well, denies pain and says his low back is feeling better. Unsure of LBM, may have been this AM, but documentation still showing 3/21 was LBM. Urinating ok. No other complaints or concerns. Nursing reporting sacral wound, WOC consulted.   ROS: +low back pain--improving. Per HPI. Denies CP, SOB, abd pain, n/v/d/c  Objective:   No results found.  No results for input(s): "WBC", "HGB", "HCT", "PLT" in the last 72 hours.  Recent Labs    10/16/23 0818 10/17/23 0545  NA 137 137  K 4.1 3.8  CL 109 108  CO2 21* 23  GLUCOSE 132* 129*  BUN 23 19  CREATININE 1.16 0.98  CALCIUM 9.0 9.3    Intake/Output Summary (Last 24 hours) at 10/18/2023 1035 Last data filed at 10/18/2023 0724 Gross per 24 hour  Intake 120 ml  Output 325 ml  Net -205 ml     Pressure Injury 10/18/23 Sacrum Right;Left;Medial Deep Tissue Pressure Injury - Purple or maroon localized area of discolored intact skin or blood-filled blister due to damage of underlying soft tissue from pressure and/or shear. (Active)  10/18/23 0400  Location: Sacrum  Location Orientation: Right;Left;Medial  Staging: Deep Tissue Pressure Injury - Purple or maroon localized area of discolored intact skin or blood-filled blister due to damage of underlying soft tissue from pressure and/or shear.  Wound Description (Comments):   Present on Admission: Yes (previosuly documented as stage I- classification changed)     Pressure Injury 10/18/23 Buttocks Right Stage 2 -  Partial thickness loss of dermis presenting as a shallow open injury with a red, pink wound bed without slough. (Active)  10/18/23 0415  Location: Buttocks  Location Orientation: Right  Staging: Stage 2 -  Partial thickness loss of dermis presenting as a shallow open injury with a red, pink wound bed without slough.  Wound Description (Comments):    Present on Admission: Yes    Physical Exam: Vital Signs Blood pressure 98/72, pulse 66, temperature 98.1 F (36.7 C), temperature source Axillary, resp. rate 18, height 5\' 11"  (1.803 m), weight 75.6 kg, SpO2 94%.  Gen: no distress, normal appearing, laying in bed resting HEENT: oral mucosa pink and moist, NCAT Cardio: RRR, no m/r/g appreciated Chest: normal effort, normal rate of breathing, CTAB Abd: soft, non-distended, nonTTP, +BS throughout Ext: no edema Psych: pleasant, normal affect   PRIOR EXAMS: Musculoskeletal:     Cervical back: Normal range of motion.     Right lower leg: No edema.     Left lower leg: No edema.     Comments: Mild right hand forearm edema. Right shoulder tender with minimal flexion, IR/ER. Mild shoulder tenderness to touch. Low back tender with bed mobility, flexion/rotation.   Skin:    Comments: Diffuse bruising/ecchymoses as well as skin lacs and abrasions of various stages on all 4 limbs. Right forearm with mild erythema, no warmth--doesn't look a whole lot different than left forearm. A few abrasions dressed.  Neurological:     Mental Status: He is alert and oriented to person, place, and time.     Comments: Pt is alert and oriented to  person, place, month, year. Thought it was the 18th. CN exam non-focal. Fair insight and awareness. Able to provide some biographical information. Focus improved. MMT: RUE limited by shoulder pain 3/5 to 4/5 distally. LUE grossly 4- to 4+/5. RLE and LLE-- 3/5 HF, 3+ KE with pain compontnents. 4+/5 ADF/PF. Found no consistent sensory loss. No cerebellar signs or abnormal reflexes.  Improved as above 3/21 Psychiatric:        Mood and Affect: Mood normal.        Behavior: Behavior normal.     Comments: Pt is pleasant and cooperative       Assessment/Plan: 1. Functional deficits which require 3+ hours per day of interdisciplinary therapy in a comprehensive inpatient rehab setting. Physiatrist is providing close team  supervision and 24 hour management of active medical problems listed below. Physiatrist and rehab team continue to assess barriers to discharge/monitor patient progress toward functional and medical goals  Care Tool:  Bathing    Body parts bathed by patient: Right arm, Left arm, Chest, Abdomen, Front perineal area, Face, Right upper leg, Left upper leg, Right lower leg, Left lower leg   Body parts bathed by helper: Buttocks     Bathing assist Assist Level: Minimal Assistance - Patient > 75%     Upper Body Dressing/Undressing Upper body dressing   What is the patient wearing?: Pull over shirt    Upper body assist Assist Level: Set up assist    Lower Body Dressing/Undressing Lower body dressing      What is the patient wearing?: Pants, Incontinence brief     Lower body assist Assist for lower body dressing: Minimal Assistance - Patient > 75%     Toileting Toileting Toileting Activity did not occur (Clothing management and hygiene only): N/A (no void or bm)  Toileting assist Assist for toileting: 2 Helpers     Transfers Chair/bed transfer  Transfers assist     Chair/bed transfer assist level: Minimal Assistance - Patient > 75%     Locomotion Ambulation   Ambulation assist      Assist level: 2 helpers Assistive device: Walker-rolling Max distance: 20ft   Walk 10 feet activity   Assist     Assist level: 2 helpers Assistive device: Walker-rolling   Walk 50 feet activity   Assist Walk 50 feet with 2 turns activity did not occur: Safety/medical concerns         Walk 150 feet activity   Assist Walk 150 feet activity did not occur: Safety/medical concerns         Walk 10 feet on uneven surface  activity   Assist Walk 10 feet on uneven surfaces activity did not occur: Safety/medical concerns         Wheelchair     Assist Is the patient using a wheelchair?: Yes Type of Wheelchair: Manual    Wheelchair assist level: Dependent  - Patient 0%      Wheelchair 50 feet with 2 turns activity    Assist        Assist Level: Dependent - Patient 0%   Wheelchair 150 feet activity     Assist      Assist Level: Dependent - Patient 0%   Blood pressure 98/72, pulse 66, temperature 98.1 F (36.7 C), temperature source Axillary, resp. rate 18, height 5\' 11"  (1.803 m), weight 75.6 kg, SpO2 94%.  Medical Problem List and Plan: 1. Functional deficits secondary to acute encephalopathy d/t NPH             -  patient may shower             -ELOS/Goals: 21-24 days, supervision to min assist with PT, OT, SLP  Grounds pass ordered  -Continue CIR   2.  Antithrombotics: -DVT/anticoagulation:  Pharmaceutical: continue Lovenox 40mg  daily             -antiplatelet therapy: Plavix 75mg  daily   3. Low back pain: kpad ordered. Tylenol as needed, asked nursing if kpad has been delivered  -10/17/23 apparently low back pain limits participation; will start lidoderm  -10/18/23 per pt, back pain improved, continue lidoderm   4.Anxiety: continue Xanax 0.5 mg every 6 hours as needed             -continue triazolam 0.25 mg daily at bedtime             -continue melatonin 3 mg daily at bedtime             -continue trazodone 50 mg daily at bedtime             -antipsychotic agents: n/a             -keep sleep chart 5. Neuropsych/cognition: This patient is  capable of making decisions on his own behalf.   6. Skin/Wound Care: Routine skin care checks             -local care as needed to multiple lacs/abrasions/bruises -10/18/23 nursing stating sacral wound a bit more extensive, WOC consulted for assistance 7. Fluids/Electrolytes/Nutrition: Routine Is and Os and follow-up chemistries             -pt reports good appetite although recorded intake is minimal on flow sheets             -check prealbumin with AM labs 8: Hypertension: monitor TID and prn             -continue atenolol 25 mg daily -10/18/23 BPs a bit soft at times with  occasional elevations, but mostly stable, monitor for now  Vitals:   10/14/23 1300 10/14/23 1924 10/15/23 0412 10/15/23 1441  BP: (!) 101/59 111/66 102/70 104/75   10/15/23 1821 10/16/23 0418 10/16/23 1334 10/16/23 2014  BP: 105/67 130/70 114/76 132/80   10/17/23 0503 10/17/23 1523 10/17/23 2005 10/18/23 0421  BP: (!) 140/76 113/67 (!) 146/95 98/72    9: Hyperlipidemia: continue simvastatin 40mg  daily   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 Dorris Fetch and plan surveillance CT in 2-3 months   14: Tobacco use: Decrease patch to as nicotine can worsen back pain   15: AKI: Cr reviewed and has resolved, stable 10/17/23   16: Thrombocytopenia: reviewed and has resolved   17: Right shoulder pain with glenohumeral joint effusion s/p aspiration 1mL joint fluid - jt fluid with 1800 wbc's and neutrophils 50%, and calcium pyrophosphate crystals; gram stain with rare wbc's, cx pending. 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             -will try voltaren gel initially             -consider steroid injection depending upon course             -?outpt f/u with shoulder specialist.   18: Right forearm cellulitis: fever resolved, no leukocytosis -continue cefadroxil 500 mg BID for 10 doses (thru 3/23)  19. Bradycardic: d/c atenolol, HR reviewed and has improved  20. Bowel management/Constipation: -10/17/23 pt stating last good BM was 3-4 days ago, but looks like LBM was yesterday; will start miralax daily for now, monitor stool  consistency, if too loose will back off.  -10/18/23 LBM 3/21 per documentation, continue current regimen but if no BM by tomorrow, may need further augmentation     LOS: 5 days A FACE TO FACE EVALUATION WAS PERFORMED  [id number] 10/18/2023, 10:35 AM

## 2023-10-18 NOTE — Progress Notes (Signed)
 Patient's bed changed to air mattress per orders. During that time observed palms of patient's hands and soles of feet to be red in color. Pt endorses this is a normal occurrence for him.

## 2023-10-19 ENCOUNTER — Inpatient Hospital Stay (HOSPITAL_COMMUNITY)

## 2023-10-19 LAB — CBC
HCT: 34.7 % — ABNORMAL LOW (ref 39.0–52.0)
Hemoglobin: 11.8 g/dL — ABNORMAL LOW (ref 13.0–17.0)
MCH: 30.6 pg (ref 26.0–34.0)
MCHC: 34 g/dL (ref 30.0–36.0)
MCV: 90.1 fL (ref 80.0–100.0)
Platelets: 161 10*3/uL (ref 150–400)
RBC: 3.85 MIL/uL — ABNORMAL LOW (ref 4.22–5.81)
RDW: 12.9 % (ref 11.5–15.5)
WBC: 5.9 10*3/uL (ref 4.0–10.5)
nRBC: 0 % (ref 0.0–0.2)

## 2023-10-19 LAB — BASIC METABOLIC PANEL
Anion gap: 9 (ref 5–15)
BUN: 20 mg/dL (ref 8–23)
CO2: 23 mmol/L (ref 22–32)
Calcium: 9.3 mg/dL (ref 8.9–10.3)
Chloride: 106 mmol/L (ref 98–111)
Creatinine, Ser: 1.11 mg/dL (ref 0.61–1.24)
GFR, Estimated: >60 mL/min (ref >60)
Glucose, Bld: 138 mg/dL — ABNORMAL HIGH (ref 70–99)
Potassium: 3.9 mmol/L (ref 3.5–5.1)
Sodium: 138 mmol/L (ref 135–145)

## 2023-10-19 MED ORDER — ZINC SULFATE 220 (50 ZN) MG PO CAPS
220.0000 mg | ORAL_CAPSULE | Freq: Every day | ORAL | Status: DC
  Administered 2023-10-19 – 2023-10-27 (×9): 220 mg via ORAL
  Filled 2023-10-19 (×9): qty 1

## 2023-10-19 MED ORDER — MAGNESIUM HYDROXIDE 400 MG/5ML PO SUSP
15.0000 mL | Freq: Once | ORAL | Status: AC
  Administered 2023-10-19: 15 mL via ORAL
  Filled 2023-10-19: qty 30

## 2023-10-19 NOTE — Progress Notes (Signed)
 Speech Language Pathology Daily Session Note  Patient Details  Name: Juan Brewer MRN: [id number] Date of Birth: Jul 08, 1946  Today's Date: 10/19/2023 SLP Individual Time: 7829-5621 SLP Individual Time Calculation (min): 15 min and Today's Date: 10/19/2023 SLP Missed Time: 45 Minutes Missed Time Reason: Patient unwilling to participate  Short Term Goals: Week 1: SLP Short Term Goal 1 (Week 1): Patient will orient to date and time utilizing available external aids given mod verbal cues. SLP Short Term Goal 2 (Week 1): Patient will recall daily events with 75% accuracy given mod verbal cues. SLP Short Term Goal 3 (Week 1): Patient will demonstrate intellectual awareness of deficits by stating 2 cognitive and 2 physical deficits given mod verbal cues. SLP Short Term Goal 4 (Week 1): Patient will attend to functional therapy tasks for 15 minutes given mod verbal cues. SLP Short Term Goal 5 (Week 1): Patient will solve basic functional problems with 75% accuracy given mod verbal cues. SLP Short Term Goal 6 (Week 1): Patient will tolerate regular/thin liquid diet during clinical diet tolerance assessments without overt s/sx concerning for aspiration.  Skilled Therapeutic Interventions:  Pt seen in am to address cognitive re- training. Pt was alert and seen at bedside. He was oriented to temporal and spatial concepts indep and to situational concepts with min A. Pt recalled completion of swallow study earlier this morning stating, "It was fine, I knew it was fine. It was all a waste of time." Pt verbalized PLOF and recent medical hx warranting min A. Limited awareness of current deficits with pt warranting min A to identify physical deficits and total A to identify cognitive deficits. SLP attempting to transition session into more structured tasks with pt declining participation. Pt stating, "Y'all come in here and ask all of these questions and I don't need it." SLP attempting to educate pt on  rationale for ST along with benefits with pt continuing to decline participation. Therefore, pt missed 45 minutes of skilled intervention. SLP to make up minutes as able.   Pain Pain Assessment Pain Scale: 0-10 Pain Score: 0-No pain  Therapy/Group: Individual Therapy  Renaee Munda 10/19/2023, 11:29 AM

## 2023-10-19 NOTE — Progress Notes (Signed)
 PROGRESS NOTE   Subjective/Complaints: Requests his prn Xanax Went down for swallow study today Has no other complaints Labs reviewed and are stable   ROS: +low back pain--improving. Per HPI. Denies CP, SOB, abd pain, n/v/d/c  Objective:   No results found.  Recent Labs    10/19/23 0727  WBC 5.9  HGB 11.8*  HCT 34.7*  PLT 161    Recent Labs    10/17/23 0545 10/19/23 0727  NA 137 138  K 3.8 3.9  CL 108 106  CO2 23 23  GLUCOSE 129* 138*  BUN 19 20  CREATININE 0.98 1.11  CALCIUM 9.3 9.3    Intake/Output Summary (Last 24 hours) at 10/19/2023 0942 Last data filed at 10/19/2023 0908 Gross per 24 hour  Intake 480 ml  Output 550 ml  Net -70 ml     Pressure Injury 10/18/23 Sacrum Right;Left;Medial Deep Tissue Pressure Injury - Purple or maroon localized area of discolored intact skin or blood-filled blister due to damage of underlying soft tissue from pressure and/or shear. (Active)  10/18/23 0400  Location: Sacrum  Location Orientation: Right;Left;Medial  Staging: Deep Tissue Pressure Injury - Purple or maroon localized area of discolored intact skin or blood-filled blister due to damage of underlying soft tissue from pressure and/or shear.  Wound Description (Comments):   Present on Admission: Yes (previosuly documented as stage I- classification changed)     Pressure Injury 10/18/23 Buttocks Right Stage 2 -  Partial thickness loss of dermis presenting as a shallow open injury with a red, pink wound bed without slough. (Active)  10/18/23 0415  Location: Buttocks  Location Orientation: Right  Staging: Stage 2 -  Partial thickness loss of dermis presenting as a shallow open injury with a red, pink wound bed without slough.  Wound Description (Comments):   Present on Admission: Yes    Physical Exam: Vital Signs Blood pressure 121/71, pulse 61, temperature 98 F (36.7 C), resp. rate 17, height 5\' 11"  (1.803  m), weight 75.6 kg, SpO2 92%.  Gen: no distress, normal appearing, laying in bed resting HEENT: oral mucosa pink and moist, NCAT Cardio: RRR, no m/r/g appreciated Chest: normal effort, normal rate of breathing, CTAB Abd: soft, non-distended, nonTTP, +BS throughout Ext: no edema Psych: pleasant, normal affect Musculoskeletal:     Cervical back: Normal range of motion.     Right lower leg: No edema.     Left lower leg: No edema.     Comments: Mild right hand forearm edema. Right shoulder tender with minimal flexion, IR/ER. Mild shoulder tenderness to touch. Low back tender with bed mobility, flexion/rotation.   Skin:    Comments: Diffuse bruising/ecchymoses as well as skin lacs and abrasions of various stages on all 4 limbs. Right forearm with mild erythema, no warmth--doesn't look a whole lot different than left forearm. A few abrasions dressed.  Neurological:     Mental Status: He is alert and oriented to person, place, and time.     Comments: Pt is alert and oriented to person, place, month, year. Thought it was the 18th. CN exam non-focal. Fair insight and awareness. Able to provide some biographical information. Focus improved. MMT: RUE limited  by shoulder pain 3/5 to 4/5 distally. LUE grossly 4- to 4+/5. RLE and LLE-- 3/5 HF, 3+ KE with pain compontnents. 4+/5 ADF/PF. Found no consistent sensory loss. No cerebellar signs or abnormal reflexes.  Stable 3/24 Psychiatric:        Mood and Affect: Mood normal.        Behavior: Behavior normal.     Comments: Pt is pleasant and cooperative       Assessment/Plan: 1. Functional deficits which require 3+ hours per day of interdisciplinary therapy in a comprehensive inpatient rehab setting. Physiatrist is providing close team supervision and 24 hour management of active medical problems listed below. Physiatrist and rehab team continue to assess barriers to discharge/monitor patient progress toward functional and medical goals  Care  Tool:  Bathing    Body parts bathed by patient: Right arm, Left arm, Chest, Abdomen, Front perineal area, Face, Right upper leg, Left upper leg, Right lower leg, Left lower leg   Body parts bathed by helper: Buttocks     Bathing assist Assist Level: Minimal Assistance - Patient > 75%     Upper Body Dressing/Undressing Upper body dressing   What is the patient wearing?: Pull over shirt    Upper body assist Assist Level: Set up assist    Lower Body Dressing/Undressing Lower body dressing      What is the patient wearing?: Pants, Incontinence brief     Lower body assist Assist for lower body dressing: Minimal Assistance - Patient > 75%     Toileting Toileting Toileting Activity did not occur (Clothing management and hygiene only): N/A (no void or bm)  Toileting assist Assist for toileting: 2 Helpers     Transfers Chair/bed transfer  Transfers assist     Chair/bed transfer assist level: Minimal Assistance - Patient > 75%     Locomotion Ambulation   Ambulation assist      Assist level: 2 helpers Assistive device: Walker-rolling Max distance: 5ft   Walk 10 feet activity   Assist     Assist level: 2 helpers Assistive device: Walker-rolling   Walk 50 feet activity   Assist Walk 50 feet with 2 turns activity did not occur: Safety/medical concerns         Walk 150 feet activity   Assist Walk 150 feet activity did not occur: Safety/medical concerns         Walk 10 feet on uneven surface  activity   Assist Walk 10 feet on uneven surfaces activity did not occur: Safety/medical concerns         Wheelchair     Assist Is the patient using a wheelchair?: Yes Type of Wheelchair: Manual    Wheelchair assist level: Dependent - Patient 0%      Wheelchair 50 feet with 2 turns activity    Assist        Assist Level: Dependent - Patient 0%   Wheelchair 150 feet activity     Assist      Assist Level: Dependent -  Patient 0%   Blood pressure 121/71, pulse 61, temperature 98 F (36.7 C), resp. rate 17, height 5\' 11"  (1.803 m), weight 75.6 kg, SpO2 92%.  Medical Problem List and Plan: 1. Functional deficits secondary to acute encephalopathy d/t NPH             -patient may shower             -ELOS/Goals: 21-24 days, supervision to min assist with PT, OT, SLP  Grounds pass  ordered  -continue CIR   2.  Antithrombotics: -DVT/anticoagulation:  Pharmaceutical: continue Lovenox 40mg  daily             -antiplatelet therapy: Plavix 75mg  daily   3. Low back pain: kpad ordered. Tylenol as needed, asked nursing if kpad has been delivered  Continue lidoderm   4.Anxiety: continue Xanax 0.5 mg every 6 hours as needed             -continue triazolam 0.25 mg daily at bedtime             -continue melatonin 3 mg daily at bedtime             -continue trazodone 50 mg daily at bedtime             -antipsychotic agents: n/a             -keep sleep chart 5. Neuropsych/cognition: This patient is  capable of making decisions on his own behalf.   6. Skin/Wound Care: Routine skin care checks             -local care as needed to multiple lacs/abrasions/bruises -10/18/23 nursing stating sacral wound a bit more extensive, WOC consulted for assistance -Zinc supplement started 7. Fluids/Electrolytes/Nutrition: Routine Is and Os and follow-up chemistries             -pt reports good appetite although recorded intake is minimal on flow sheets             -check prealbumin with AM labs 8: Hypertension: monitor TID and prn             Atenolol d/ced due to bradycardia  Vitals:   10/15/23 0412 10/15/23 1441 10/15/23 1821 10/16/23 0418  BP: 102/70 104/75 105/67 130/70   10/16/23 1334 10/16/23 2014 10/17/23 0503 10/17/23 1523  BP: 114/76 132/80 (!) 140/76 113/67   10/17/23 2005 10/18/23 0421 10/18/23 1934 10/19/23 0450  BP: (!) 146/95 98/72 124/77 121/71    9: Hyperlipidemia: continue simvastatin 40mg  daily   10: NPH  (s/p V-P shunt placement 11/2022 Dr. Lovell Sheehan)             -follows with [facility] (11/26/2023)   11: COPD exacerbation: continue Breo and Incruse Ellipta, Tessalon 200mg  TID   12: UTI: mixed flora-- completed course of cefuroxime - end 3/18   13: History of parapneumonic effusion, s/p LUL biopsy             -follows with Dr. Dorris Fetch and plan surveillance CT in 2-3 months   14: Tobacco use: d/c nicotine patch as nicotine can worsen back pain   15: AKI: Cr reviewed and has resolved, stable 10/17/23   16: Thrombocytopenia: reviewed and has resolved   17: Right shoulder pain with glenohumeral joint effusion s/p aspiration 1mL joint fluid - jt fluid with 1800 wbc's and neutrophils 50%, and calcium pyrophosphate crystals; gram stain with rare wbc's, cx pending. 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             -will try voltaren gel initially             -consider steroid injection depending upon course             -?  outpt f/u with shoulder specialist.   18: Right forearm cellulitis: fever resolved, no leukocytosis -completed course of cefadroxil 500 mg BID for 10 doses  19. Bradycardic: d/c atenolol, HR reviewed and has improved  20. Bowel management/Constipation: messaged nursing regarding when last BM was     LOS: 6 days A FACE TO FACE EVALUATION WAS PERFORMED  Drema Pry Joselito Fieldhouse 10/19/2023, 9:42 AM

## 2023-10-19 NOTE — Progress Notes (Signed)
 Physical Therapy Session Note  Patient Details  Name: Juan Brewer MRN: [id number] Date of Birth: [birth year]  Today's Date: 10/19/2023 PT Individual Time: 1257-1332 PT Individual Time Calculation (min): 35 min   Short Term Goals: Week 1:  PT Short Term Goal 1 (Week 1): Pt will complete bed mobility with modA PT Short Term Goal 2 (Week 1): Pt will complete bed<>chair transfers with modA PT Short Term Goal 3 (Week 1): Pt will ambulate 3ft with modA and LRAD PT Short Term Goal 4 (Week 1): Pt will tolerate sitting OOB for > 1 hour between therapy sessions   Skilled Therapeutic Interventions/Progress Updates:    Session focused on bed mobility, transfers, and dynamic standing balance to perform hygiene and clothing management for toileting. Pt requires CGA to come to EOB with cues for initiation. Pt reporting needing to use bathroom and then states he already went but thought he could still go more. Transferred with min assist for stand step to w/c with cues for technique needed and hand placement as well as foot placement for safety. Similar transfer technique to the toilet and pt able to stand with CGA and mange pants down. Pt had already been in continent but then voided more in the toilet. Pt wanted to clean himself and did with extra time but due to getting washcloth in toilet water, PT also assisted. Then noted dressing had been soiled so needed to be changed. Returned to chair and practiced sit < > stands with min assist again for new clothing management, dressing change, and pulling up brief. Left up in TIS w/c with alarm donned and son present.   Therapy Documentation Precautions:  Precautions Precautions: Fall Precaution/Restrictions Comments: Fear of Falling Restrictions Weight Bearing Restrictions Per Provider Order: No    Pain: Pain Assessment Pain Scale: 0-10 Pain Score: 0-No pain    Therapy/Group: Individual Therapy  Karolee Stamps Darrol Poke, PT, DPT,  CBIS  10/19/2023, 1:37 PM

## 2023-10-19 NOTE — Progress Notes (Signed)
 Physical Therapy Session Note  Patient Details  Name: Juan Brewer MRN: [id number] Date of Birth: 1946-05-06  Today's Date: 10/19/2023 PT Individual Time: [id number] PT Individual Time Calculation (min): 44 min   Short Term Goals: Week 1:  PT Short Term Goal 1 (Week 1): Pt will complete bed mobility with modA PT Short Term Goal 2 (Week 1): Pt will complete bed<>chair transfers with modA PT Short Term Goal 3 (Week 1): Pt will ambulate 6ft with modA and LRAD PT Short Term Goal 4 (Week 1): Pt will tolerate sitting OOB for > 1 hour between therapy sessions  Skilled Therapeutic Interventions/Progress Updates:      Pt presents in bed - agreeable to PT tx with min encouragement. No reports of pain.  Supine<>Sitting EOB with HOB Raised and ++ time for initiation and processing with supervision assist. Completes stand pivot transfer with minA into wheelchair with cues for setup and sequencing.  Transported at wheelchair level to main rehab gym. Sit<>stand to RW with minA for managing his posterior lean. Ambulates 81ft + 66ft + 53ft with minA and RW with wheelchair follow for safety. Extended seated rest breaks b/w efforts. Pt with improved upright in posture with ambulation but needs minA for posterior bias and RW support.   Introduced Museum/gallery curator with the 6" steps and 2 hand rails. He completed up/down x4 steps with minA with +2 assist on standby. Min cues for sequencing and technique.   Pt returned to his room at end of session requesting to lie down. minA for stand pivot and able to reposition himself in bed with cues with bed flat and using bed rails. All needs met at end. Pt requesting heartburn medication - LPN made aware.   Therapy Documentation Precautions:  Precautions Precautions: Fall Precaution/Restrictions Comments: Fear of Falling Restrictions Weight Bearing Restrictions Per Provider Order: No General:    Therapy/Group: Individual Therapy  Orrin Brigham 10/19/2023, 7:46 AM

## 2023-10-19 NOTE — Progress Notes (Signed)
 Occupational Therapy Weekly Progress Note  Patient Details  Name: Juan Brewer MRN: [id number] Date of Birth: [birth year]  Beginning of progress report period: October 13, 2023 End of progress report period: October 19, 2023  Today's Date: 10/19/2023 OT Individual Time: 2952-8413 OT Individual Time Calculation (min): 49 min    Patient has met 3 of 3 short term goals.  Patient is making excellent progress in functional mobility.    Patient continues to demonstrate the following deficits: muscle weakness, decreased cardiorespiratoy endurance, decreased coordination and decreased motor planning, decreased attention to right, decreased initiation, decreased attention, decreased awareness, decreased problem solving, decreased safety awareness, decreased memory, and delayed processing, and decreased standing balance, decreased postural control, hemiplegia, and decreased balance strategies and therefore will continue to benefit from skilled OT intervention to enhance overall performance with BADL and Reduce care partner burden.  Patient progressing toward long term goals..  Continue plan of care.  OT Short Term Goals Week 1:  OT Short Term Goal 1 (Week 1): Pt will initiate movement to EOB with no more than mod cueing OT Short Term Goal 1 - Progress (Week 1): Met OT Short Term Goal 2 (Week 1): Pt will complete sit > stand with max A of 1 OT Short Term Goal 2 - Progress (Week 1): Met OT Short Term Goal 3 (Week 1): Pt will improve awareness of deficits by asking for assist during LB dressing OT Short Term Goal 3 - Progress (Week 1): Discontinued (comment) OT Short Term Goal 4 (Week 1): Pt will attend to RUE during grooming task with mod cueing OT Short Term Goal 4 - Progress (Week 1): Met Week 2:  OT Short Term Goal 1 (Week 2): Patient will transition to sitting at edge of bed with close supervision OT Short Term Goal 2 (Week 2): Patient will complete a stand step transfer to wheelchair/toilet/  shower seat with no more than contact guard assistance OT Short Term Goal 3 (Week 2): Patient will remain out of bed x 1 hour at least 3 times/day to improve overall endurance.  Skilled Therapeutic Interventions/Progress Updates:   Patient received supine in bed.  Patient offered friendly greeting and showed eagerness for daily shower.  Patient able to sit at edge of bed with only intermittent contact guard - this demonstrates significant improvement over last week.  Patient has very poor initiation, and needs increased time to move himself.  Patient able to transfer from bed to wheelchair with contact guard assistance and 2 trials.  Patient pulled to stand in shower to stand step toward shower bench.  Patient able to bathe himself, needing cueing and assistance to wash buttocks.  Patient asked to shave himself and did so safely seated at sink.  Patient completed oral care once set up.  Patient able to independently don pull over shirt, then requests to get back into bed to don brief and shorts.  Provided skin care to address sacral issues after speaking with nurse.  Patient missed last 10 min of this session due to accepting a work call.  Patient left in bed with bed alarm engaged and call bell in reach.   Therapy Documentation Precautions:  Precautions Precautions: Fall Precaution/Restrictions Comments: Fear of Falling Restrictions Weight Bearing Restrictions Per Provider Order: No   Pain:  Denies pain      Therapy/Group: Individual Therapy  Collier Salina 10/19/2023, 10:55 AM
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
## 2023-10-20 MED ORDER — VITAMIN D 25 MCG (1000 UNIT) PO TABS
1000.0000 [IU] | ORAL_TABLET | Freq: Every day | ORAL | Status: DC
  Administered 2023-10-20 – 2023-10-27 (×8): 1000 [IU] via ORAL
  Filled 2023-10-20 (×8): qty 1

## 2023-10-20 NOTE — Progress Notes (Signed)
 Occupational Therapy Session Note  Patient Details  Name: Juan Brewer MRN: [id number] Date of Birth: 11/19/1945  Today's Date: 10/20/2023 OT Individual Time: 1300-1400 OT Individual Time Calculation (min): 60 min    Short Term Goals: Week 1:  OT Short Term Goal 1 (Week 1): Pt will initiate movement to EOB with no more than mod cueing OT Short Term Goal 1 - Progress (Week 1): Met OT Short Term Goal 2 (Week 1): Pt will complete sit > stand with max A of 1 OT Short Term Goal 2 - Progress (Week 1): Met OT Short Term Goal 3 (Week 1): Pt will improve awareness of deficits by asking for assist during LB dressing OT Short Term Goal 3 - Progress (Week 1): Discontinued (comment) OT Short Term Goal 4 (Week 1): Pt will attend to RUE during grooming task with mod cueing OT Short Term Goal 4 - Progress (Week 1): Met Week 2:  OT Short Term Goal 1 (Week 2): Patient will transition to sitting at edge of bed with close supervision OT Short Term Goal 2 (Week 2): Patient will complete a stand step transfer to wheelchair/toilet/ shower seat with no more than contact guard assistance OT Short Term Goal 3 (Week 2): Patient will remain out of bed x 1 hour at least 3 times/day to improve overall endurance. Week 3:     Skilled Therapeutic Interventions/Progress Updates:    1:1 When arrived in the room pt laying flat in the bed eating his lunch in the bed with plate next to him - eating with his fingers. Pt came to EOB with supervision with encouragement. Pt ate more of his lunch sitting EOB but then kept laying back down and wanting to eat. Pt laid down and got back up ~ 5 times with max encouragement to maintaining sitting position to finish eating. Tried to encourage pt to ambulate with RW to the bathroom to participate in showering and dressing. Pt would not engage in walking - kept standing and then returning to sitting and then laying down. Pt reports he can't walk and needs the w/c. Pt then transferred  with CGA to the w/c with encouragement to not lay down again before transferring. CGA to transfer into shower. PT able to shower CGA with cues for sequencing to wash all parts. When pt donning clothing at the sink; pt voided urine in sitting in the w/c. When asked pt if he voided he reports just a little and trying to wipe himself off with his clean shirt he already donned. Assisted for clean up . PT able to finish donning clothing with setup and extra time. Pt was able to ambulate with min A from door to bed with max encouragement to try to walk. Pt reports he is afraid of falling. Pt left resting in the bed at end of session.   Therapy Documentation Precautions:  Precautions Precautions: Fall Precaution/Restrictions Comments: Fear of Falling Restrictions Weight Bearing Restrictions Per Provider Order: No  Pain:  No c/o pain    Therapy/Group: Individual Therapy  Roney Mans [facility] 10/20/2023, 3:30 PM

## 2023-10-20 NOTE — Progress Notes (Signed)
 Physical Therapy Session Note  Patient Details  Name: Juan Brewer MRN: [id number] Date of Birth: 1946-04-15  Today's Date: 10/20/2023 PT Individual Time: 1030-1054 PT Individual Time Calculation (min): 24 min   Short Term Goals: Week 1:  PT Short Term Goal 1 (Week 1): Pt will complete bed mobility with modA PT Short Term Goal 2 (Week 1): Pt will complete bed<>chair transfers with modA PT Short Term Goal 3 (Week 1): Pt will ambulate 39ft with modA and LRAD PT Short Term Goal 4 (Week 1): Pt will tolerate sitting OOB for > 1 hour between therapy sessions  Skilled Therapeutic Interventions/Progress Updates:      Pt sitting upright in TIS w/c - agreeable to PT tx. Has no c/o pain.  Transported at wheelchair level to ortho rehab gym. Completed ambulatory car transfer with minA and RW with cues for safety approach and general technique. Able to manage his BLE in/out of vehicle without assist. Car height set to simulate his personal vehicle.   Short distance gait training ~63ft with minA and RW with cues for lengthening stride length and improving his proximity to RW. Assisted to bed and patient left lying with his needs met and call bell in lap.   Pt continues to require ++ time for initiation and processing throughout functional mobility training. Does best when hands off to allow freedom of mobility and to process movement patterns.   Therapy Documentation Precautions:  Precautions Precautions: Fall Precaution/Restrictions Comments: Fear of Falling Restrictions Weight Bearing Restrictions Per Provider Order: No General:     Therapy/Group: Individual Therapy  Orrin Brigham 10/20/2023, 7:47 AM

## 2023-10-20 NOTE — Progress Notes (Signed)
 Speech Language Pathology Daily Session Note  Patient Details  Name: [name] MRN: [id number] Date of Birth: Dec 20, 1945  Today's Date: 10/20/2023 SLP Individual Time: 4098-1191 SLP Individual Time Calculation (min): 33 min and Today's Date: 10/20/2023 SLP Missed Time: 12 Minutes; 30 minutes Missed Time Reason: Patient unwilling to participate  Short Term Goals: Week 1: SLP Short Term Goal 1 (Week 1): Patient will orient to date and time utilizing available external aids given mod verbal cues. SLP Short Term Goal 2 (Week 1): Patient will recall daily events with 75% accuracy given mod verbal cues. SLP Short Term Goal 3 (Week 1): Patient will demonstrate intellectual awareness of deficits by stating 2 cognitive and 2 physical deficits given mod verbal cues. SLP Short Term Goal 4 (Week 1): Patient will attend to functional therapy tasks for 15 minutes given mod verbal cues. SLP Short Term Goal 5 (Week 1): Patient will solve basic functional problems with 75% accuracy given mod verbal cues. SLP Short Term Goal 6 (Week 1): Patient will tolerate regular/thin liquid diet during clinical diet tolerance assessments without overt s/sx concerning for aspiration.  Skilled Therapeutic Interventions:  Session 1: Patient was seen in am to address cognitive re- training. Pt alert and seated upright in TIS WC upon SLP arrival. He stated, "I thought we were done with this" in reference to SLP. Pt looking at schedule and agreeable for this SLP session however stated he would not participate in session scheduled for later in afternoon. Pt oriented to temporal concepts with mod A and external visual aid. He was oriented to setting indep and situation with min A. SLP challenged pt in awareness with pt verbalizing physical limitations with min A however, total A needed for awareness of cognitive deficits. Pt recalled Pt session and therapist name with sup A. SLP addressing recall of functional information with  pt naming family members, hx of career, etc with min to mod A. Pt with notable difficulty verbalizing job functions and timeline of business. SLP addressing problem solving as pt verbalizing desire to return to bed. Pt warranting min A to identify safe vs unsafe situations presented verbally. Pt requesting discontinuation of session stating, "can we be done with this" "You're asking too many questions" and " I'm done." Pt encouraged to remain upright in TIS WC however, intently expressing desire to return to bed. SLP notified pt's NT who f/u. Pt missed 12 minutes of skilled intervention due to refusal. SLP will attempt to make up minutes as able. SLP to continue POC.  Session 2: Attempted to see pt for second session of day. Pt easily alerted upon SLP arrival however, intently declining to participate in skilled intervention. Thus pt missed 30 minutes of skilled intervention. SLP to make up minutes as pt and clinician schedule allows.   Pain Pain Assessment Pain Scale: 0-10 Pain Score: 6  Pain Location: Back Pain Intervention(s): Medication (See eMAR)  Therapy/Group: Individual Therapy  Renaee Munda 10/20/2023, 12:43 PM

## 2023-10-20 NOTE — Progress Notes (Signed)
 Physical Therapy Session Note  Patient Details  Name: Juan Brewer MRN: [id number] Date of Birth: [birth year]  Today's Date: 10/20/2023 PT Individual Time: 4098-1191 PT Individual Time Calculation (min): 27 min   Short Term Goals: Week 1:  PT Short Term Goal 1 (Week 1): Pt will complete bed mobility with modA PT Short Term Goal 2 (Week 1): Pt will complete bed<>chair transfers with modA PT Short Term Goal 3 (Week 1): Pt will ambulate 50ft with modA and LRAD PT Short Term Goal 4 (Week 1): Pt will tolerate sitting OOB for > 1 hour between therapy sessions  Skilled Therapeutic Interventions/Progress Updates: Patient supine in bed with nsg present on entrance to room. Patient alert and agreeable to PT session.   Patient with no complaints of pain. Pt stated need to have BM. Pt cued to transition from supine<sit EOB with increased time required for pt to initiate. Pt stated fear of falling that is preventing movement. PTA encouraged pt to try to only focus on first step with PTA asking pt what that step would be. Pt stated "to place feet on floor." Pt to sit to EOB with close supervision shortly after. PTA donned B shoes dependently for time management. PTA provided further encouragement to ambulate from EOB<toilet in bathroom vs transfer to TIS to be transported (fear of falling per pt report). Pt stated that there is + 2 available to ensure safety, and that rehab tech will follow behind in TIS to increase pt's comfort. Pt stood from EOB with minA and VC for sequence (multiple attempts to place UE around PTA/tech). Pt ambulated short distance in room with CGA/minA due to posterior lean, and to safely navigate doorway to bathroom. Pt sat minA to control eccentric. Pt had BM and bladder void. PTA provided posterior peri-care with pt standing in RW with VC to place hands on RW. Pt also required VC to increase upright posture to avoid forward LOB. Pt required 2 seated rest breaks due to reports of fear  of falling. PTA donned new brief (due to brief being soiled). Pt politely refused ambulating back to TIS beside bed. Pt transferred to TIS from toilet with minA and VC to pivot until back of knees on edge of seat.   Patient sitting in TIS at end of session with brakes locked, belt alarm set, and all needs within reach.      Therapy Documentation Precautions:  Precautions Precautions: Fall Precaution/Restrictions Comments: Fear of Falling Restrictions Weight Bearing Restrictions Per Provider Order: No  Therapy/Group: Individual Therapy  Journee Kohen PTA 10/20/2023, 12:42 PM
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
## 2023-10-21 LAB — VITAMIN D 25 HYDROXY (VIT D DEFICIENCY, FRACTURES): Vit D, 25-Hydroxy: 50.45 ng/mL (ref 30–100)

## 2023-10-21 LAB — MAGNESIUM: Magnesium: 2.1 mg/dL (ref 1.7–2.4)

## 2023-10-21 MED ORDER — MAGNESIUM HYDROXIDE 400 MG/5ML PO SUSP
15.0000 mL | Freq: Once | ORAL | Status: DC
  Filled 2023-10-21: qty 30

## 2023-10-21 MED ORDER — ALPRAZOLAM 0.5 MG PO TABS
0.5000 mg | ORAL_TABLET | ORAL | Status: DC | PRN
  Administered 2023-10-21 – 2023-10-26 (×11): 0.5 mg via ORAL
  Filled 2023-10-21 (×12): qty 1

## 2023-10-21 NOTE — Progress Notes (Signed)
 Physical Therapy Weekly Progress Note  Patient Details  Name: Juan Brewer MRN: [id number] Date of Birth: 1945-12-21  Beginning of progress report period: October 14, 2023 End of progress report period: October 21, 2023  Today's Date: 10/21/2023 PT Individual Time: 1345-1440 PT Individual Time Calculation (min): 55 min   Patient has met 4 of 4 short term goals.  Mr. Juan Brewer is making quicker than anticipated progress towards LTG of minA. He's currently supervision for bed mobility, minA for stand pivot transfers, and minA for short distance gait ~84ft with RW. He's even been able to complete x4, 6" steps with minA (having +2 assist on standby). He's primarily limited by generalized weakness and debility, cognitive impairments with delayed processing and initiation, and is somewhat apathetic towards therapy needing moderate encouragement for participation and effort. Anticipate patient is progressing near his baseline level of functional mobility.   Patient continues to demonstrate the following deficits muscle weakness and muscle joint tightness, decreased cardiorespiratoy endurance, unbalanced muscle activation and decreased coordination, decreased motor planning, decreased initiation, decreased attention, decreased awareness, decreased problem solving, decreased safety awareness, decreased memory, and delayed processing, and decreased standing balance, decreased postural control, hemiplegia, and decreased balance strategies and therefore will continue to benefit from skilled PT intervention to increase functional independence with mobility.  Patient progressing toward long term goals..  Continue plan of care.  PT Short Term Goals Week 1:  PT Short Term Goal 1 (Week 1): Pt will complete bed mobility with modA PT Short Term Goal 1 - Progress (Week 1): Met PT Short Term Goal 2 (Week 1): Pt will complete bed<>chair transfers with modA PT Short Term Goal 2 - Progress (Week 1): Met PT Short Term  Goal 3 (Week 1): Pt will ambulate 58ft with modA and LRAD PT Short Term Goal 3 - Progress (Week 1): Met PT Short Term Goal 4 (Week 1): Pt will tolerate sitting OOB for > 1 hour between therapy sessions PT Short Term Goal 4 - Progress (Week 1): Met Week 2:  PT Short Term Goal 1 (Week 2): STG = LTG due to ELOS  Skilled Therapeutic Interventions/Progress Updates:      Pt in bed to start - asks about his DC date - updated on decision for DC on Tuesday 4/1 - pt reports he was hopeful it would be sooner than that. Discussed need for family training and education and can update DC date depending on how those go. Pt voiced understanding. He reported need to go to the bathroom before leaving his room.   Supine to sitting EOB with supervision assist. Pt needing encouragement to ambulate to the bathroom rather than relying on wheelchair transfer. Completed sit<>stand to RW with minA and ambulates with minA and RW within his room to the bathroom. Pt with decreased safety awareness, letting go of RW while ambulating to reach for furniture and doorways. Pt with BM incontinence - unsure if this was present in bed or on route to the bathroom. New clean brief provided. Ambulated sinkside with RW and minA and he completed hand hygiene in standing with CGA for standing balance. Assisted him with donning clean shorts with modA for threading and pulling over hips in standing.  Transported to main rehab gym for time. Switched out TIS wheelchair for a standard manual wheelchair. Noted skin tear on his R calf - cleaned and provided bandage. Educated pt on skin care 2/2 fragile and thin skin.  Gait training ~20ft with minA and RW- cues for increasing  his gait speed, lengthening his stride, and keeping a forward gaze. Once sitting EOM - pt lying down without warning on the mat table. Reports his "Back was tired." Assisted back to sitting upright with minA for initiation and trunk support. minA for stand pivot back to his  wheelchair and similarly back to his bed in his room. Bed mobility completed with supervision.   Therapy Documentation Precautions:  Precautions Precautions: Fall Precaution/Restrictions Comments: Fear of Falling Restrictions Weight Bearing Restrictions Per Provider Order: No General:    Therapy/Group: Individual Therapy  Jeydi Klingel P Annalia Metzger  PT, DPT, CSRS  10/21/2023, 7:50 AM

## 2023-10-21 NOTE — Plan of Care (Signed)
 Pt's plan of care adjusted to 15/7 after speaking with care team and discussed with MD in team conference as pt currently unable to tolerate current therapy schedule with OT, PT, and SLP.

## 2023-10-21 NOTE — Progress Notes (Signed)
 Speech Language Pathology Weekly Progress and Session Note  Patient Details  Name: Juan Brewer MRN: [id number] Date of Birth: Dec 04, 1945  Beginning of progress report period: October 14, 2023 End of progress report period: October 21, 2023  Today's Date: 10/21/2023 SLP Missed Time: 45 Minutes Missed Time Reason: Patient unwilling to participate  Short Term Goals: Week 1: SLP Short Term Goal 1 (Week 1): Patient will orient to date and time utilizing available external aids given mod verbal cues. SLP Short Term Goal 1 - Progress (Week 1): Not met SLP Short Term Goal 2 (Week 1): Patient will recall daily events with 75% accuracy given mod verbal cues. SLP Short Term Goal 2 - Progress (Week 1): Not met SLP Short Term Goal 3 (Week 1): Patient will demonstrate intellectual awareness of deficits by stating 2 cognitive and 2 physical deficits given mod verbal cues. SLP Short Term Goal 3 - Progress (Week 1): Not met SLP Short Term Goal 4 (Week 1): Patient will attend to functional therapy tasks for 15 minutes given mod verbal cues. SLP Short Term Goal 4 - Progress (Week 1): Not met SLP Short Term Goal 5 (Week 1): Patient will solve basic functional problems with 75% accuracy given mod verbal cues. SLP Short Term Goal 5 - Progress (Week 1): Not met SLP Short Term Goal 6 (Week 1): Patient will tolerate regular/thin liquid diet during clinical diet tolerance assessments without overt s/sx concerning for aspiration. SLP Short Term Goal 6 - Progress (Week 1): Met    New Short Term Goals: Week 2: SLP Short Term Goal 1 (Week 2): Patient will orient to date and time utilizing available external aids given mod verbal cues. SLP Short Term Goal 2 (Week 2): Patient will recall daily events with 75% accuracy given mod verbal cues. SLP Short Term Goal 3 (Week 2): Patient will demonstrate intellectual awareness of deficits by stating 2 cognitive and 2 physical deficits given mod verbal cues. SLP Short Term  Goal 4 (Week 2): Patient will attend to functional therapy tasks for 15 minutes given mod verbal cues. SLP Short Term Goal 5 (Week 2): Patient will solve basic functional problems with 75% accuracy given mod verbal cues.  Weekly Progress Updates: Patient has made limited gains and has met 1 of 6 STG's this reporting period. Pt has had limited participation in skilled intervention. He did paticipate in MBSS where SLP recommended regular solids and thin liquids as safest diet. Currently, pt conitnues to require mod A for orientation, problem solving, recall, and attention. He requires total A for awareness. Pt/ family education ongoing. Pt would benefit from continued skilled SLP intervention to maximize cognition in order to maximize his functional independence.   Intensity: Minumum of 1-2 x/day, 30 to 90 minutes Frequency: 3 to 5 out of 7 days Duration/Length of Stay: 3-3.5 weeks Treatment/Interventions: Dysphagia/aspiration precaution training;Patient/family education;Cognitive remediation/compensation;Environmental controls;Cueing hierarchy;Functional tasks;Therapeutic Activities   Daily Session  Skilled Therapeutic Interventions: Attempted to see pt for skilled intervention. Pt stated, "It's the speech girl" upon SLP arrival. SLP attempting to initiate session through proposing pt participation in a functional task. Pt stated, "I'm leaving" and "I'm not gaining anything from this." Pt declined participation in skilled intervention despite education on benefits for cognitive re- training. Therefore pt missed 45 minutes of skilled intervention. SLP to continue POC.   General    Pain Pain Assessment Pain Scale: 0-10 Pain Score: 0-No pain  Therapy/Group: Individual Therapy  Renaee Munda 10/21/2023, 10:15 AM

## 2023-10-21 NOTE — Progress Notes (Signed)
 Patient ID: Neftaly Swiss, male   DOB: 1946-03-09, 78 y.o.   MRN: [id number]  Met with pt and spoke with wife via telephone to discuss team conference goals of min assist and target discharge date of 4/1. She is aware of his refusal to do speech therapies and is talking to him regarding this along with his children. Have scheduled for wife to come in for education Monday 3/31 from 1:00-4:00 pm. She reports they are active and home and he does not stay in the bed like he tells the team here.  Will continue to work on discharge needs.

## 2023-10-21 NOTE — Progress Notes (Signed)
 Occupational Therapy Session Note  Patient Details  Name: Juan Brewer MRN: [id number] Date of Birth: 12-Dec-1945  Today's Date: 10/21/2023 OT Individual Time: 4098-1191 OT Individual Time Calculation (min): 41 min    Short Term Goals: Week 2:  OT Short Term Goal 1 (Week 2): Patient will transition to sitting at edge of bed with close supervision OT Short Term Goal 2 (Week 2): Patient will complete a stand step transfer to wheelchair/toilet/ shower seat with no more than contact guard assistance OT Short Term Goal 3 (Week 2): Patient will remain out of bed x 1 hour at least 3 times/day to improve overall endurance.  Skilled Therapeutic Interventions/Progress Updates:    Patient received up in tilt in space wheelchair.  Patient reports back "feels fine."  Patient asking to shower.  Wheeled into bathroom to shower.  Patient able to stand and step into shower with use of grab bar and close supervision!  Patient showered while seated without cueing for steps of task.  Dried self while seated then dressed himself sit to stand at sink.  Transitioning to stand with supervision and increased time with UE support on solid surface, e.g. grab bar, countertop.  Patient frequently makes comments about being asked questions - "You sure have a lot of questions.  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  Patient requesting to get back in bed at end of session.  Left up in bed with personal items in reach.    Therapy Documentation Precautions:  Precautions Precautions: Fall Precaution/Restrictions Comments: Fear of Falling Restrictions Weight Bearing Restrictions Per Provider Order: No   Pain:  Reports his back feels fine.          Therapy/Group: Individual Therapy  Collier Salina 10/21/2023, 9:27 AM

## 2023-10-21 NOTE — Progress Notes (Signed)
 PROGRESS NOTE   Subjective/Complaints: No new complaints this morning Asks for Xanax to be increased to q4H prn for his anxiety Back pain improved   ROS: +low back pain--improving. Per HPI. Denies CP, SOB, abd pain, n/v/d/c, +anxiety  Objective:   No results found.   Recent Labs    10/19/23 0727  WBC 5.9  HGB 11.8*  HCT 34.7*  PLT 161    Recent Labs    10/19/23 0727  NA 138  K 3.9  CL 106  CO2 23  GLUCOSE 138*  BUN 20  CREATININE 1.11  CALCIUM 9.3    Intake/Output Summary (Last 24 hours) at 10/21/2023 1106 Last data filed at 10/21/2023 1610 Gross per 24 hour  Intake 240 ml  Output 500 ml  Net -260 ml     Pressure Injury 10/18/23 Sacrum Right;Left;Medial Deep Tissue Pressure Injury - Purple or maroon localized area of discolored intact skin or blood-filled blister due to damage of underlying soft tissue from pressure and/or shear. (Active)  10/18/23 0400  Location: Sacrum  Location Orientation: Right;Left;Medial  Staging: Deep Tissue Pressure Injury - Purple or maroon localized area of discolored intact skin or blood-filled blister due to damage of underlying soft tissue from pressure and/or shear.  Wound Description (Comments):   Present on Admission: Yes (previosuly documented as stage I- classification changed)     Pressure Injury 10/18/23 Buttocks Right Stage 2 -  Partial thickness loss of dermis presenting as a shallow open injury with a red, pink wound bed without slough. (Active)  10/18/23 0415  Location: Buttocks  Location Orientation: Right  Staging: Stage 2 -  Partial thickness loss of dermis presenting as a shallow open injury with a red, pink wound bed without slough.  Wound Description (Comments):   Present on Admission: Yes    Physical Exam: Vital Signs Blood pressure 128/80, pulse (!) 58, temperature 97.6 F (36.4 C), resp. rate 15, height 5\' 11"  (1.803 m), weight 75.6 kg, SpO2  95%.  Gen: no distress, normal appearing, laying in bed resting HEENT: oral mucosa pink and moist, NCAT Cardio: Bradycardia Chest: normal effort, normal rate of breathing, CTAB Abd: soft, non-distended, nonTTP, +BS throughout Ext: no edema Psych: pleasant, normal affect Musculoskeletal:     Cervical back: Normal range of motion.     Right lower leg: No edema.     Left lower leg: No edema.     Comments: Mild right hand forearm edema. Right shoulder tender with minimal flexion, IR/ER. Mild shoulder tenderness to touch. Low back tender with bed mobility, flexion/rotation.   Skin:    Comments: Diffuse bruising/ecchymoses as well as skin lacs and abrasions of various stages on all 4 limbs. Right forearm with mild erythema, no warmth--doesn't look a whole lot different than left forearm. A few abrasions dressed.  Neurological:     Mental Status: He is alert and oriented to person, place, and time.     Comments: Pt is alert and oriented to person, place, month, year. Thought it was the 18th. CN exam non-focal. Fair insight and awareness. Able to provide some biographical information. Focus improved. MMT: RUE limited by shoulder pain 3/5 to 4/5 distally. LUE grossly  4- to 4+/5. RLE and LLE-- 3/5 HF, 3+ KE with pain compontnents. 4+/5 ADF/PF. Found no consistent sensory loss. No cerebellar signs or abnormal reflexes.  Stable 3/26 Psychiatric:        Mood and Affect: Mood normal.        Behavior: Behavior normal.     Comments: Pt is pleasant and cooperative       Assessment/Plan: 1. Functional deficits which require 3+ hours per day of interdisciplinary therapy in a comprehensive inpatient rehab setting. Physiatrist is providing close team supervision and 24 hour management of active medical problems listed below. Physiatrist and rehab team continue to assess barriers to discharge/monitor patient progress toward functional and medical goals  Care Tool:  Bathing    Body parts bathed by  patient: Right arm, Left arm, Chest, Abdomen, Front perineal area, Face, Right upper leg, Left upper leg, Right lower leg, Left lower leg   Body parts bathed by helper: Buttocks     Bathing assist Assist Level: Minimal Assistance - Patient > 75%     Upper Body Dressing/Undressing Upper body dressing   What is the patient wearing?: Pull over shirt    Upper body assist Assist Level: Set up assist    Lower Body Dressing/Undressing Lower body dressing      What is the patient wearing?: Pants, Incontinence brief     Lower body assist Assist for lower body dressing: Contact Guard/Touching assist     Toileting Toileting Toileting Activity did not occur (Clothing management and hygiene only): N/A (no void or bm)  Toileting assist Assist for toileting: 2 Helpers     Transfers Chair/bed transfer  Transfers assist     Chair/bed transfer assist level: Contact Guard/Touching assist     Locomotion Ambulation   Ambulation assist      Assist level: 2 helpers Assistive device: Walker-rolling Max distance: 31ft   Walk 10 feet activity   Assist     Assist level: 2 helpers Assistive device: Walker-rolling   Walk 50 feet activity   Assist Walk 50 feet with 2 turns activity did not occur: Safety/medical concerns         Walk 150 feet activity   Assist Walk 150 feet activity did not occur: Safety/medical concerns         Walk 10 feet on uneven surface  activity   Assist Walk 10 feet on uneven surfaces activity did not occur: Safety/medical concerns         Wheelchair     Assist Is the patient using a wheelchair?: Yes Type of Wheelchair: Manual    Wheelchair assist level: Dependent - Patient 0%      Wheelchair 50 feet with 2 turns activity    Assist        Assist Level: Dependent - Patient 0%   Wheelchair 150 feet activity     Assist      Assist Level: Dependent - Patient 0%   Blood pressure 128/80, pulse (!) 58,  temperature 97.6 F (36.4 C), resp. rate 15, height 5\' 11"  (1.803 m), weight 75.6 kg, SpO2 95%.  Medical Problem List and Plan: 1. Functional deficits secondary to acute encephalopathy d/t NPH             -patient may shower             -ELOS/Goals: 21-24 days, supervision to min assist with PT, OT, SLP  Grounds pass ordered  -continue CIR   Vitamin D supplement started 2.  Antithrombotics: -DVT/anticoagulation:  Pharmaceutical: continue Lovenox 40mg  daily             -antiplatelet therapy: continue Plavix 75mg  daily   3. Low back pain: kpad ordered. Tylenol as needed, asked nursing if kpad has been delivered  Continue idoderm   4.Anxiety: Xanax increase to q4H prn             -continue triazolam 0.25 mg daily at bedtime             -continue melatonin 3 mg daily at bedtime             -continue trazodone 50 mg daily at bedtime             -antipsychotic agents: n/a             -keep sleep chart 5. Neuropsych/cognition: This patient is  capable of making decisions on his own behalf.   6. Skin/Wound Care: Routine skin care checks             -local care as needed to multiple lacs/abrasions/bruises -10/18/23 nursing stating sacral wound a bit more extensive, WOC consulted for assistance -Zinc supplement started 7. Fluids/Electrolytes/Nutrition: Routine Is and Os and follow-up chemistries             -pt reports good appetite although recorded intake is minimal on flow sheets             -check prealbumin with AM labs 8: Hypertension: monitor TID and prn             Atenolol d/ced due to bradycardia  Xanax increased to q4H prn  Vitals:   10/17/23 0503 10/17/23 1523 10/17/23 2005 10/18/23 0421  BP: (!) 140/76 113/67 (!) 146/95 98/72   10/18/23 1934 10/19/23 0450 10/19/23 1408 10/19/23 1916  BP: 124/77 121/71 127/78 132/75   10/20/23 0418 10/20/23 1408 10/20/23 2025 10/21/23 0405  BP: 109/75 112/67 (!) 142/79 128/80    9: Hyperlipidemia: continue simvastatin 40mg  daily   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 Lovell Sheehan)             -follows with [facility] (11/26/2023)   11: COPD exacerbation: continue Breo and Incruse Ellipta, Tessalon 200mg  TID, incentive spirometer ordered   12: UTI: mixed flora-- completed course of cefuroxime - end 3/18   13: History of parapneumonic effusion, s/p LUL biopsy             -follows with Dr. Dorris Fetch and plan surveillance CT in 2-3 months   14: Tobacco use: d/c nicotine patch as nicotine can worsen back pain   15: AKI: Cr reviewed and has resolved, stable 10/17/23   16: Thrombocytopenia: reviewed and has resolved   17: Right shoulder pain with glenohumeral joint effusion s/p aspiration 1mL joint fluid - jt fluid with 1800 wbc's and neutrophils 50%, and calcium pyrophosphate crystals; gram stain with rare wbc's, cx pending. 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             -will try voltaren gel initially             -consider steroid injection depending upon course             -?  outpt f/u with shoulder specialist.   18: Right forearm cellulitis: fever resolved, no leukocytosis -completed course of cefadroxil 500 mg BID for 10 doses  19. Bradycardic: d/c atenolol, HR reviewed and has improved  20. Constipation: improved, d/c miralax, messaged nursing regarding when last BM was     LOS: 8 days A FACE TO FACE EVALUATION WAS PERFORMED  Yerlin Gasparyan P Svea Pusch 10/21/2023, 11:06 AM

## 2023-10-21 NOTE — Patient Care Conference (Signed)
 Inpatient RehabilitationTeam Conference and Plan of Care Update Date: 10/21/2023   Time: 11:17 AM    Patient Name: Juan Brewer      Medical Record Number: [id number]  Date of Birth: 1945-08-02 Sex: Male         Room/Bed: 4M11C/4M11C-01 Payor Info: Payor: MEDICARE / Plan: MEDICARE PART A AND B / Product Type: *No Product type* /    Admit Date/Time:  10/13/2023  1:24 PM  Primary Diagnosis:  Encephalopathy  Hospital Problems: Principal Problem:   Encephalopathy    Expected Discharge Date: Expected Discharge Date: 10/27/23  Team Members Present: Physician leading conference: Dr. Sula Soda Social Worker Present: Dossie Der, LCSW Nurse Present: Chana Bode, RN PT Present: Wynelle Link, PT OT Present: Bretta Bang, OT SLP Present: Other (comment) Alvera Novel, SLP) PPS Coordinator present : Fae Pippin, SLP     Current Status/Progress Goal Weekly Team Focus  Bowel/Bladder   Continent of B/B with intermittent incontinence, at times requires assitance with urinal as to not spill.   Remain continent   Assist with toileting Q2-3hr    Swallow/Nutrition/ Hydration   Regular/ thin- MBSS copleted with recommendations for reg/ thin   Mod I  carryover of safe swallowing compensatory strategies    ADL's   Total assist BADL initially, min assist BADL   Min assist BADL   Family Education    Mobility   supervision bed mobility (with ++ time), minA for stand pivot transfers, minA gait RW 62ft, minA stairs (x4). Self limiting, cognitive limitations   minA  Family education, DC planning, gait training, general strengthening and balance training, cognitive/behavior management.    Communication                Safety/Cognition/ Behavioral Observations  Mod A cognitive deficits- orientation, AWARENESS, problem solving, recall, attention. Pt with poor participation and frequently declining to participate in sessions   min A   orientation. awareness,  participation, problem solving    Pain   Chronic back pain, has voltaren gel and kpad   Pain <3/10   Assess Qshift and prn    Skin   St 2 to right butt cheek, DTI to Left butt cheek, Multiple skin tears, and scattered bruising   Promote healing and prevention of infection or new skin breakdown  Assess qshift and prn      Discharge Planning:  Home with wife and other fmaily members to assist-aware of his deficits and hopeful he will do well here   Team Discussion: Patient post encephalopathy and limited by anxiety; needs extra time for processing due to moderate cognitive deficits and premorbid cognitive impairments.  Functional improvement limited by fear of falling and self limiting behaviors, poor participation with therapy sessions.   Patient on target to meet rehab goals: Currently needs min assist to ambulate up to 30' an manage steps.  Goals for discharge set for min assist overall.  *See Care Plan and progress notes for long and short-term goals.   Revisions to Treatment Plan:  Changed to 15/7 therapy schedule   Teaching Needs: Safety, medications, transfers, toileting, skin care/pressure relief measures, etc.   Current Barriers to Discharge: Decreased caregiver support, Home enviroment access/layout, and Behavior  Possible Resolutions to Barriers: Family education     Medical Summary Current Status: anxiety, low back pain, bradycardia, cough, HLD, low back pain    Barriers to Discharge Comments: anxiety, low back pain, bradycardia, cough, HLD, low back pain Possible Resolutions to [employer] Focus: increase Xanax to  0.5mg  q4H prn, nicotine patch weaned off, continue to monitor HR TID, continue tessalon, continue statin, continue lidocaine patch   Continued Need for Acute Rehabilitation Level of Care: The patient requires daily medical management by a physician with specialized training in physical medicine and rehabilitation for the following  reasons: Direction of a multidisciplinary physical rehabilitation program to maximize functional independence : Yes Medical management of patient stability for increased activity during participation in an intensive rehabilitation regime.: Yes Analysis of laboratory values and/or radiology reports with any subsequent need for medication adjustment and/or medical intervention. : Yes   I attest that I was present, lead the team conference, and concur with the assessment and plan of the team.   Chana Bode B 10/21/2023, 3:47 PM

## 2023-10-21 NOTE — Progress Notes (Signed)
 Physical Therapy Session Note  Patient Details  Name: Juan Brewer MRN: [id number] Date of Birth: 1945-09-12  Today's Date: 10/21/2023 PT Individual Time: 4098-1191 PT Individual Time Calculation (min): 40 min   Short Term Goals: Week 1:  PT Short Term Goal 1 (Week 1): Pt will complete bed mobility with modA PT Short Term Goal 1 - Progress (Week 1): Met PT Short Term Goal 2 (Week 1): Pt will complete bed<>chair transfers with modA PT Short Term Goal 2 - Progress (Week 1): Met PT Short Term Goal 3 (Week 1): Pt will ambulate 81ft with modA and LRAD PT Short Term Goal 3 - Progress (Week 1): Met PT Short Term Goal 4 (Week 1): Pt will tolerate sitting OOB for > 1 hour between therapy sessions PT Short Term Goal 4 - Progress (Week 1): Met  Skilled Therapeutic Interventions/Progress Updates: Pt presented in bed agreeable to therapy. Pt denies pain during session. Session focused on transfers, bed mobility, and gait. With significantly increased time pt completed supine to sit to L with supervision and use of bed features. Completed stand step transfer to TIS with CGA overall and increased time. Pt required cues to maintain hands on RW when approaching TIS due to pt reaching for TIS prior to properly aligning with chair. Pt transported to main gym for time management. With encouragement pt ambulated ~79ft with CGA to stand and CGA overall. Pt requesting chair follow with PTA initially providing then leaving chair about halfway. Pt then indicated need for BM, completed stand step transfer back to TIS with extensive encouragement and increased time. Pt did require minA to stand from standard chair/lower surface. Pt transported to room and TIS left just outside bathroom to have pt complete ambulatory transfer to toilet. At toilet pt sat prior to lowering clothes. PTA questioned how pt was to use bathroom, pt attempted to lower clothes however unable to due to sitting on toilet. After a moment pt was able  to problem solve that he needed to stand. Performed Sit to stand from toilet with minA and use of wall rail. Pt was able to manage clothes in standing with CGA return to sitting and have continent urinary void. Pt required minA again to stand and completed clothing management with CGA. Pt then completed ambulatory transfer to TIS with CGA however with poor safety awareness as pt grabbed door frame and began to sit on armrest requiring minA from PTA for appropriate hip placement. Pt left in TIS at end of session with belt alarm on, call bell within reach and needs met.      Therapy Documentation Precautions:  Precautions Precautions: Fall Precaution/Restrictions Comments: Fear of Falling Restrictions Weight Bearing Restrictions Per Provider Order: No General:   Vital Signs: Therapy Vitals Temp: 97.6 F (36.4 C) Temp Source: Oral Pulse Rate: 66 Resp: 18 BP: 135/78 Patient Position (if appropriate): Lying Oxygen Therapy SpO2: 95 % O2 Device: Room Air Pain:   Mobility:   Locomotion :    Trunk/Postural Assessment :    Balance:   Exercises:   Other Treatments:      Therapy/Group: Individual Therapy  Stephane Junkins 10/21/2023, 3:41 PM

## 2023-10-22 NOTE — Progress Notes (Signed)
 Occupational Therapy Session Note  Patient Details  Name: Juan Brewer MRN: [id number] Date of Birth: 1946-06-02  {CHL IP REHAB OT TIME CALCULATIONS:[id number]}   Short Term Goals: Week 2:  OT Short Term Goal 1 (Week 2): Patient will transition to sitting at edge of bed with close supervision OT Short Term Goal 2 (Week 2): Patient will complete a stand step transfer to wheelchair/toilet/ shower seat with no more than contact guard assistance OT Short Term Goal 3 (Week 2): Patient will remain out of bed x 1 hour at least 3 times/day to improve overall endurance.  Skilled Therapeutic Interventions/Progress Updates:    Patient agreeable to participate in OT session. Reports *** pain level.   Patient participated in skilled OT session focusing on ***. Therapist facilitated/assessed/developed/educated/integrated/elicited *** in order to improve/facilitate/promote    Therapy Documentation Precautions:  Precautions Precautions: Fall Precaution/Restrictions Comments: Fear of Falling Restrictions Weight Bearing Restrictions Per Provider Order: No   Therapy/Group: Individual Therapy  Limmie Patricia, OTR/L,CBIS  Supplemental OT - [facility] and [facility] Secure Chat Preferred   10/22/2023, 10:11 PM

## 2023-10-22 NOTE — Progress Notes (Signed)
 Occupational Therapy Session Note  Patient Details  Name: Juan Brewer MRN: [id number] Date of Birth: 1945/10/06  Today's Date: 10/22/2023 OT Individual Time: 1425-1505 OT Individual Time Calculation (min): 40 min    Short Term Goals: Week 1:  OT Short Term Goal 1 (Week 1): Pt will initiate movement to EOB with no more than mod cueing OT Short Term Goal 1 - Progress (Week 1): Met OT Short Term Goal 2 (Week 1): Pt will complete sit > stand with max A of 1 OT Short Term Goal 2 - Progress (Week 1): Met OT Short Term Goal 3 (Week 1): Pt will improve awareness of deficits by asking for assist during LB dressing OT Short Term Goal 3 - Progress (Week 1): Discontinued (comment) OT Short Term Goal 4 (Week 1): Pt will attend to RUE during grooming task with mod cueing OT Short Term Goal 4 - Progress (Week 1): Met Week 2:  OT Short Term Goal 1 (Week 2): Patient will transition to sitting at edge of bed with close supervision OT Short Term Goal 2 (Week 2): Patient will complete a stand step transfer to wheelchair/toilet/ shower seat with no more than contact guard assistance OT Short Term Goal 3 (Week 2): Patient will remain out of bed x 1 hour at least 3 times/day to improve overall endurance.  Skilled Therapeutic Interventions/Progress Updates:    1:! Pt received in the w/c. Pt reports he would like to shower this pm and initiated doffing his t shirt. Pt taken into the bathroom via w/c. Pt able to tranfer from w/c to tub bench with grab (stand step transfer) with CGA to supervision. Pt showered all parts sit to stand with supervision with cues for sequencing and ensuring he was washing al parts. Pt transferred out of shower with CGA to the w/c. Pt dressed sit to stand at the sink with supervision with extra time. PT able to orient clothing without cues and don correctly. Pt is oriented to self, place, 1/2 of situation and to time. Pt remembered this clinician and about her son. Pt performed  grooming at this sink with min A. Pt transferred back into bed with supervision and extra time. Pt left resting in bed bed.   Therapy Documentation Precautions:  Precautions Precautions: Fall Precaution/Restrictions Comments: Fear of Falling Restrictions Weight Bearing Restrictions Per Provider Order: No General:   Vital Signs: Therapy Vitals Temp: 97.6 F (36.4 C) Pulse Rate: 68 Resp: 17 BP: 105/85 Patient Position (if appropriate): Lying Oxygen Therapy SpO2: 100 % O2 Device: Room Air Pain:   No reports of pain when arrived or throughout session   Therapy/Group: Individual Therapy  Roney Mans [facility] 10/22/2023, 2:38 PM

## 2023-10-22 NOTE — Progress Notes (Signed)
 Physical Therapy Session Note  Patient Details  Name: Juan Brewer MRN: [id number] Date of Birth: 1945-12-12  Today's Date: 10/22/2023 PT Individual Time: 1355-1414 PT Individual Time Calculation (min): 19 min   Short Term Goals: Week 2:  PT Short Term Goal 1 (Week 2): STG = LTG due to ELOS  Skilled Therapeutic Interventions/Progress Updates:      Pt lying in bed to start - agreeable to therapy session with encouragement. Pt reporting need to toilet, when asked. Supine<>sitting EOB with minA while directing him to his R side. Pt requesting to take the wheelchair to the bathroom but able to be redirected with the RW. Sit<>stand to RW with light minA for initiation. Ambulated with CGA and RW with safety cues for keeping body within walker frame and safety approach to sitting on the toilet. Pt with already soiled brief - soaked with urine. Pt continent of small BM on toilet. Both charted. MaxA for posterior pericare in standing. Ambulated sinkside with CGA and RW for patient to complete hand washing in standing - CGA for standing balance. Also completed oral care in standing after hand washing to encourage standing tolerance. Returned to wheelchair and patient left sitting upright with his needs met, call bell in reach, and seat belt alarm on.     Therapy Documentation Precautions:  Precautions Precautions: Fall Precaution/Restrictions Comments: Fear of Falling Restrictions Weight Bearing Restrictions Per Provider Order: No General:     Therapy/Group: Individual Therapy  Latacha Texeira P Thatcher Doberstein 10/22/2023, 2:02 PM

## 2023-10-22 NOTE — Progress Notes (Signed)
 Physical Therapy Session Note  Patient Details  Name: Juan Brewer MRN: [id number] Date of Birth: 04-Jan-1946  Today's Date: 10/22/2023 PT Individual Time: 1093-2355 PT Individual Time Calculation (min): 41 min   Short Term Goals: Week 2:  PT Short Term Goal 1 (Week 2): STG = LTG due to ELOS  Skilled Therapeutic Interventions/Progress Updates:       Pt presents in bed and agreeable to therapy. Has no complaints of pain.   Supine<>sitting EOB with minA for initiation and trunk support. Completed stand pivot transfer at minA level from bed to chair without an AD. Cues fo rhand placement.  Instructed in wc mobility using BUE - pt drifting R due to premorbid R sided weakness. Instructed in using BLE + BUE and patient able to propel himself at supervision level 151ft while maintaining straight path, although slow and inefficient.  Stair training completed using 6" steps and 2 hand rails. Light minA for navigating up/down 4 + 4 steps with seated rest breaks b/w efforts. Step-to pattern while forward facing and min cues for safety awareness.  Returned to his room and assisted pt back to bed with minA stand pivot transfer. Bed mobility completed with supervision to return to lying. All needs met at end.    Therapy Documentation Precautions:  Precautions Precautions: Fall Precaution/Restrictions Comments: Fear of Falling Restrictions Weight Bearing Restrictions Per Provider Order: No General:    Therapy/Group: Individual Therapy  Orrin Brigham 10/22/2023, 7:50 AM

## 2023-10-22 NOTE — Progress Notes (Signed)
 PROGRESS NOTE   Subjective/Complaints: No new complaints this morning Denies pain Tolerating therapy No issues overnight  ROS: +low back pain--improving. Per HPI. Denies CP, SOB, abd pain, n/v/d/c, +anxiety  Objective:   No results found.   No results for input(s): "WBC", "HGB", "HCT", "PLT" in the last 72 hours.   No results for input(s): "NA", "K", "CL", "CO2", "GLUCOSE", "BUN", "CREATININE", "CALCIUM" in the last 72 hours.   Intake/Output Summary (Last 24 hours) at 10/22/2023 1021 Last data filed at 10/22/2023 0732 Gross per 24 hour  Intake 597 ml  Output 150 ml  Net 447 ml     Pressure Injury 10/18/23 Sacrum Right;Left;Medial Deep Tissue Pressure Injury - Purple or maroon localized area of discolored intact skin or blood-filled blister due to damage of underlying soft tissue from pressure and/or shear. wound present on 3/18 ad (Active)  10/18/23 0400  Location: Sacrum  Location Orientation: Right;Left;Medial  Staging: Deep Tissue Pressure Injury - Purple or maroon localized area of discolored intact skin or blood-filled blister due to damage of underlying soft tissue from pressure and/or shear.  Wound Description (Comments): wound present on 3/18 admission/ see old LDA- evolved into a DTI on 10/18/23  Present on Admission: Yes (previosuly documented as stage I- classification changed)     Pressure Injury 10/18/23 Buttocks Right Stage 2 -  Partial thickness loss of dermis presenting as a shallow open injury with a red, pink wound bed without slough. was present on admission on 3/18/ was included in another LDA/ see old LDA (Active)  10/18/23 0415  Location: Buttocks  Location Orientation: Right  Staging: Stage 2 -  Partial thickness loss of dermis presenting as a shallow open injury with a red, pink wound bed without slough.  Wound Description (Comments): was present on admission on 3/18/ was included in another LDA/  see old LDA  Present on Admission: Yes    Physical Exam: Vital Signs Blood pressure (!) 144/84, pulse (!) 56, temperature (!) 97.5 F (36.4 C), resp. rate 15, height 5\' 11"  (1.803 m), weight 75.6 kg, SpO2 97%.  Gen: no distress, normal appearing, laying in bed resting HEENT: oral mucosa pink and moist, NCAT Cardio: Bradycardia Chest: normal effort, normal rate of breathing, CTAB Abd: soft, non-distended, nonTTP, +BS throughout Ext: no edema Psych: pleasant, normal affect Musculoskeletal:     Cervical back: Normal range of motion.     Right lower leg: No edema.     Left lower leg: No edema.     Comments: Mild right hand forearm edema. Right shoulder tender with minimal flexion, IR/ER. Mild shoulder tenderness to touch. Low back tender with bed mobility, flexion/rotation.   Skin:    Comments: Diffuse bruising/ecchymoses as well as skin lacs and abrasions of various stages on all 4 limbs. Right forearm with mild erythema, no warmth--doesn't look a whole lot different than left forearm. A few abrasions dressed.  Neurological:     Mental Status: He is alert and oriented to person, place, and time.     Comments: Pt is alert and oriented to person, place, month, year. Thought it was the 18th. CN exam non-focal. Fair insight and awareness. Able to provide some  biographical information. Focus improved. MMT: RUE limited by shoulder pain 3/5 to 4/5 distally. LUE grossly 4- to 4+/5. RLE and LLE-- 3/5 HF, 3+ KE with pain compontnents. 4+/5 ADF/PF. Found no consistent sensory loss. No cerebellar signs or abnormal reflexes.  Stable 3/27 Psychiatric:        Mood and Affect: Mood normal.        Behavior: Behavior normal.     Comments: Pt is pleasant and cooperative       Assessment/Plan: 1. Functional deficits which require 3+ hours per day of interdisciplinary therapy in a comprehensive inpatient rehab setting. Physiatrist is providing close team supervision and 24 hour management of active  medical problems listed below. Physiatrist and rehab team continue to assess barriers to discharge/monitor patient progress toward functional and medical goals  Care Tool:  Bathing    Body parts bathed by patient: Right arm, Left arm, Chest, Abdomen, Front perineal area, Face, Right upper leg, Left upper leg, Right lower leg, Left lower leg   Body parts bathed by helper: Buttocks     Bathing assist Assist Level: Minimal Assistance - Patient > 75%     Upper Body Dressing/Undressing Upper body dressing   What is the patient wearing?: Pull over shirt    Upper body assist Assist Level: Set up assist    Lower Body Dressing/Undressing Lower body dressing      What is the patient wearing?: Pants, Incontinence brief     Lower body assist Assist for lower body dressing: Contact Guard/Touching assist     Toileting Toileting Toileting Activity did not occur (Clothing management and hygiene only): N/A (no void or bm)  Toileting assist Assist for toileting: 2 Helpers     Transfers Chair/bed transfer  Transfers assist     Chair/bed transfer assist level: Contact Guard/Touching assist     Locomotion Ambulation   Ambulation assist      Assist level: 2 helpers Assistive device: Walker-rolling Max distance: 15ft   Walk 10 feet activity   Assist     Assist level: 2 helpers Assistive device: Walker-rolling   Walk 50 feet activity   Assist Walk 50 feet with 2 turns activity did not occur: Safety/medical concerns         Walk 150 feet activity   Assist Walk 150 feet activity did not occur: Safety/medical concerns         Walk 10 feet on uneven surface  activity   Assist Walk 10 feet on uneven surfaces activity did not occur: Safety/medical concerns         Wheelchair     Assist Is the patient using a wheelchair?: Yes Type of Wheelchair: Manual    Wheelchair assist level: Dependent - Patient 0%      Wheelchair 50 feet with 2 turns  activity    Assist        Assist Level: Dependent - Patient 0%   Wheelchair 150 feet activity     Assist      Assist Level: Dependent - Patient 0%   Blood pressure (!) 144/84, pulse (!) 56, temperature (!) 97.5 F (36.4 C), resp. rate 15, height 5\' 11"  (1.803 m), weight 75.6 kg, SpO2 97%.  Medical Problem List and Plan: 1. Functional deficits secondary to acute encephalopathy d/t NPH             -patient may shower             -ELOS/Goals: 21-24 days, supervision to min assist with PT, OT,  SLP  Grounds pass ordered  -continue CIR   Vitamin D supplement started 2.  Antithrombotics: -DVT/anticoagulation:  Pharmaceutical: continue Lovenox 40mg  daily             -antiplatelet therapy: continue Plavix 75mg  daily   3. Low back pain: kpad ordered. Tylenol as needed, asked nursing if kpad has been delivered  Continue lidoderm   4.Anxiety: continue Xanaxq4H prn             -continue triazolam 0.25 mg daily at bedtime             -continue melatonin 3 mg daily at bedtime             -continue trazodone 50 mg daily at bedtime             -antipsychotic agents: n/a             -keep sleep chart 5. Neuropsych/cognition: This patient is  capable of making decisions on his own behalf.   6. Skin/Wound Care: Routine skin care checks             -local care as needed to multiple lacs/abrasions/bruises -10/18/23 nursing stating sacral wound a bit more extensive, WOC consulted for assistance -Zinc supplement started 7. Fluids/Electrolytes/Nutrition: Routine Is and Os and follow-up chemistries             -pt reports good appetite although recorded intake is minimal on flow sheets             -check prealbumin with AM labs 8: Hypertension: monitor TID and prn             Atenolol d/ced due to bradycardia  Xanax increased to q4H prn  Vitals:   10/18/23 0421 10/18/23 1934 10/19/23 0450 10/19/23 1408  BP: 98/72 124/77 121/71 127/78   10/19/23 1916 10/20/23 0418 10/20/23 1408  10/20/23 2025  BP: 132/75 109/75 112/67 (!) 142/79   10/21/23 0405 10/21/23 1324 10/21/23 1931 10/22/23 0502  BP: 128/80 135/78 137/78 (!) 144/84    9: Hyperlipidemia: continue simvastatin 40mg  daily   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 Dorris Fetch and plan surveillance CT in 2-3 months   14: Tobacco use: d/c nicotine patch as nicotine can worsen back pain   15: AKI: Cr reviewed and has resolved, stable 10/17/23   16: Thrombocytopenia: reviewed and has resolved   17: Right shoulder pain with glenohumeral joint effusion s/p aspiration 1mL joint fluid - jt fluid with 1800 wbc's and neutrophils 50%, and calcium pyrophosphate crystals; gram stain with rare wbc's, cx pending. 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             -will try voltaren gel initially             -  consider steroid injection depending upon course             -?outpt f/u with shoulder specialist.   18: Right forearm cellulitis: fever resolved, no leukocytosis -completed course of cefadroxil 500 mg BID for 10 doses  19. Bradycardic: d/c atenolol, HR reviewed and has improved  20. Constipation: improved, d/c miralax, last BM 3/26, d/c senna-docusate     LOS: 9 days A FACE TO FACE EVALUATION WAS PERFORMED  Clint Bolder P Maliek Schellhorn 10/22/2023, 10:21 AM

## 2023-10-22 NOTE — Progress Notes (Signed)
 Physical Therapy Session Note  Patient Details  Name: Juan Brewer MRN: [id number] Date of Birth: 24-Dec-1945  Today's Date: 10/22/2023 PT Individual Time: 0910-1000 PT Individual Time Calculation (min): 50 min   Short Term Goals: Week 2:  PT Short Term Goal 1 (Week 2): STG = LTG due to ELOS  Skilled Therapeutic Interventions/Progress Updates: Pt presented in bed agreeable to therapy. Pt denies pain during session. With increased time for initiation pt performed supine to sit with HOB slightly elevated and increased time. At EOB pt performed stand pivot transfer refusing to use RW and just grasping for w/c to pivot self over. Pt transported to day room for energy conservation. With significantly increased time to initiate pt stood with minA from lowered w/c and ambulated ~33ft with CGA. Pt noted to ambulate with forward flexed posture, narrow BOS, shortened step length, and significantly decreased self selected gait speed. Pt transported partial distance back to room and propelled remaining distance back to room using BUE for general conditioning. Pt encouraged to increase shoulder extension to allow for more efficient propulsion and required verbal cues to increase effort with RUE due to w/c continuously drifting to R. In room pt noted to have small skin tear, nsg notified and prior to transfer to bed PTA dabbed with gauze. Pt completed stand pivot transfer to bed with CGA with pt pulling from bed rail. Pt used increased momentum to place self in bed. Pt left in bed at end of session with call bell within reach and needs met.      Therapy Documentation Precautions:  Precautions Precautions: Fall Precaution/Restrictions Comments: Fear of Falling Restrictions Weight Bearing Restrictions Per Provider Order: No General: PT Amount of Missed Time (min): 10 Minutes Vital Signs: Therapy Vitals Temp: 97.6 F (36.4 C) Pulse Rate: 68 Resp: 17 BP: 105/85 Patient Position (if appropriate):  Lying Oxygen Therapy SpO2: 100 % O2 Device: Room Air    Therapy/Group: Individual Therapy  Tobey Schmelzle 10/22/2023, 2:50 PM

## 2023-10-23 MED ORDER — TRAMADOL HCL 50 MG PO TABS
50.0000 mg | ORAL_TABLET | Freq: Two times a day (BID) | ORAL | Status: DC | PRN
  Administered 2023-10-23: 50 mg via ORAL
  Filled 2023-10-23: qty 1

## 2023-10-23 NOTE — Progress Notes (Signed)
 Endorses poor sleep overnight due to pain. PRN pain medication given overnight. Currently using the KPAD and scheduled pain cream this morning. Continues to endorse neck pain and pain to the right shoulder area. Repositioned in bed .

## 2023-10-23 NOTE — Plan of Care (Signed)
  Problem: Consults Goal: RH GENERAL PATIENT EDUCATION Description: See Patient Education module for education specifics. Outcome: Progressing   Problem: RH BOWEL ELIMINATION Goal: RH STG MANAGE BOWEL WITH ASSISTANCE Description: STG Manage Bowel with toileting Assistance. Outcome: Progressing Goal: RH STG MANAGE BOWEL W/MEDICATION W/ASSISTANCE Description: STG Manage Bowel with Medication with mod I Assistance. Outcome: Progressing   Problem: RH BLADDER ELIMINATION Goal: RH STG MANAGE BLADDER WITH ASSISTANCE Description: STG Manage Bladder With toileting Assistance Outcome: Progressing   Problem: RH SKIN INTEGRITY Goal: RH STG SKIN FREE OF INFECTION/BREAKDOWN Description: Manage skin w min assist Outcome: Progressing Goal: RH STG MAINTAIN SKIN INTEGRITY WITH ASSISTANCE Description: STG Maintain Skin Integrity With min Assistance. Outcome: Progressing   Problem: RH SAFETY Goal: RH STG ADHERE TO SAFETY PRECAUTIONS W/ASSISTANCE/DEVICE Description: STG Adhere to Safety Precautions With cues Assistance/Device. Outcome: Progressing   Problem: RH PAIN MANAGEMENT Goal: RH STG PAIN MANAGED AT OR BELOW PT'S PAIN GOAL Description: < 4 with prns Outcome: Progressing   Problem: Consults Goal: RH GENERAL PATIENT EDUCATION Description: See Patient Education module for education specifics. Outcome: Progressing

## 2023-10-23 NOTE — Progress Notes (Signed)
 Speech Language Pathology Daily Session Note  Patient Details  Name: Juan Brewer MRN: [id number] Date of Birth: 04-29-1946  Today's Date: 10/23/2023 SLP Individual Time: 4782-9562 SLP Individual Time Calculation (min): 42 min and Today's Date: 10/23/2023 SLP Missed Time: 18 Minutes Missed Time Reason: Patient fatigue  Short Term Goals: Week 2: SLP Short Term Goal 1 (Week 2): Patient will orient to date and time utilizing available external aids given mod verbal cues. SLP Short Term Goal 2 (Week 2): Patient will recall daily events with 75% accuracy given mod verbal cues. SLP Short Term Goal 3 (Week 2): Patient will demonstrate intellectual awareness of deficits by stating 2 cognitive and 2 physical deficits given mod verbal cues. SLP Short Term Goal 4 (Week 2): Patient will attend to functional therapy tasks for 15 minutes given mod verbal cues. SLP Short Term Goal 5 (Week 2): Patient will solve basic functional problems with 75% accuracy given mod verbal cues.  Skilled Therapeutic Interventions:   Pt seen for skilled SLP intervention to address cognitive-linguistic goals. Pt expressed fatigue at the start of session. He participated in the majority of the session but did request to end session early due to his fatigue. Pt encouraged to rest and participate fully with PT and OT sessions scheduled later today. Pt completed visual safety awareness tasks with ~70% accuracy independently. He required min verbal cues to identify remaining hazards in picture. Pt completed visual memory task with a range of 40-80% accuracy depending on the complexity of the picture. Lower accuracy observed for more complex picture. He improved accuracies to 60-90% given min cues. Pt left reclined in bed with call bell in reach. Continue SLP PoC.   Pain Pain Assessment Pain Scale: 0-10 Pain Score: 0-No pain  Therapy/Group: Individual Therapy  Ellery Plunk 10/23/2023, 10:20 AM

## 2023-10-23 NOTE — Progress Notes (Signed)
 Occupational Therapy Session Note  Patient Details  Name: Juan Brewer MRN: [id number] Date of Birth: 01-Jun-1946  Today's Date: 10/24/2023 OT Individual Time: 4098-1191 OT Individual Time Calculation (min): 40 min   Short Term Goals: Week 2:  OT Short Term Goal 1 (Week 2): Patient will transition to sitting at edge of bed with close supervision OT Short Term Goal 2 (Week 2): Patient will complete a stand step transfer to wheelchair/toilet/ shower seat with no more than contact guard assistance OT Short Term Goal 3 (Week 2): Patient will remain out of bed x 1 hour at least 3 times/day to improve overall endurance.  Skilled Therapeutic Interventions/Progress Updates:  Pt received resting in bed, un-rated pain in RUE, rest/shower provided. Pt receptive to bathing at shower level. Transition to EOB with increased time and multimodal cuing. Pt defers ambulating into bathroom, performing stand-pivots to access WC, BSC, and TTB, all with CGA-Min A (to avoid premature sitting). Bathing with close supervision + use of grab bar for standing pericare, cuing provided for thorough bathing of posterior periarea. Pt dresses with Min A for threading/hiking brief due to material. Pt dons socks with setup A. Pt requesting to return to bed, all needs within reach.   Therapy Documentation Precautions:  Precautions Precautions: Fall Precaution/Restrictions Comments: Fear of Falling Restrictions Weight Bearing Restrictions Per Provider Order: No   Therapy/Group: Individual Therapy  Lou Cal, OTR/L, MSOT  10/24/2023, 4:02 PM

## 2023-10-23 NOTE — Progress Notes (Signed)
 Physical Therapy Session Note  Patient Details  Name: Juan Brewer MRN: [id number] Date of Birth: 09-Dec-1945  Today's Date: 10/23/2023 PT Individual Time: 1117-1158  PT Individual Time Calculation (min): 41 min   Short Term Goals: Week 1:  PT Short Term Goal 1 (Week 1): Pt will complete bed mobility with modA PT Short Term Goal 1 - Progress (Week 1): Met PT Short Term Goal 2 (Week 1): Pt will complete bed<>chair transfers with modA PT Short Term Goal 2 - Progress (Week 1): Met PT Short Term Goal 3 (Week 1): Pt will ambulate 64ft with modA and LRAD PT Short Term Goal 3 - Progress (Week 1): Met PT Short Term Goal 4 (Week 1): Pt will tolerate sitting OOB for > 1 hour between therapy sessions PT Short Term Goal 4 - Progress (Week 1): Met Week 2:  PT Short Term Goal 1 (Week 2): STG = LTG due to ELOS  Skilled Therapeutic Interventions/Progress Updates:  Patient supine in bed and asleep on entrance to room. Slightly difficult to rouse initially but wakes with time then fully alert and agreeable to PT session.   Patient with neck pain complaint L>R  at start of session. MD approached this therapist earlier in morning and requested myofascial release to pt's neck to attempt to improve pain complaint.   Pt requires reposition in bed toward HOB. Bed positioned for access to pt's neck in room. Provided STM then CFM and TPR to L>R scalenes, trapezoids, splenius, SCM, and levator scapulae. Performed to pt's relief and reduction in trigger points noted.   Family member arrives and pt positioned for upcoming lunch arrival. Patient supine in bed at end of session with brakes locked, bed alarm set, and all needs within reach.   Therapy Documentation Precautions:  Precautions Precautions: Fall Precaution/Restrictions Comments: Fear of Falling Restrictions Weight Bearing Restrictions Per Provider Order: No  Pain:  Reduction in neck pain following myofascial release.    Therapy/Group:  Individual Therapy  Loel Dubonnet PT, DPT, CSRS 10/23/2023, 10:18 AM

## 2023-10-23 NOTE — Progress Notes (Addendum)
 PROGRESS NOTE   Subjective/Complaints: Has neck pain, using kpad, asked PT to perform myofascial release during her session +fatigue   ROS: +low back pain--improving. Per HPI. Denies CP, SOB, abd pain, n/v/d/c, +anxiety, +fatigue  Objective:   No results found.   No results for input(s): "WBC", "HGB", "HCT", "PLT" in the last 72 hours.   No results for input(s): "NA", "K", "CL", "CO2", "GLUCOSE", "BUN", "CREATININE", "CALCIUM" in the last 72 hours.   Intake/Output Summary (Last 24 hours) at 10/23/2023 1031 Last data filed at 10/23/2023 0805 Gross per 24 hour  Intake 720 ml  Output --  Net 720 ml     Pressure Injury 10/18/23 Sacrum Right;Left;Medial Deep Tissue Pressure Injury - Purple or maroon localized area of discolored intact skin or blood-filled blister due to damage of underlying soft tissue from pressure and/or shear. wound present on 3/18 ad (Active)  10/18/23 0400  Location: Sacrum  Location Orientation: Right;Left;Medial  Staging: Deep Tissue Pressure Injury - Purple or maroon localized area of discolored intact skin or blood-filled blister due to damage of underlying soft tissue from pressure and/or shear.  Wound Description (Comments): wound present on 3/18 admission/ see old LDA- evolved into a DTI on 10/18/23  Present on Admission: Yes (previosuly documented as stage I- classification changed)     Pressure Injury 10/18/23 Buttocks Right Stage 2 -  Partial thickness loss of dermis presenting as a shallow open injury with a red, pink wound bed without slough. was present on admission on 3/18/ was included in another LDA/ see old LDA (Active)  10/18/23 0415  Location: Buttocks  Location Orientation: Right  Staging: Stage 2 -  Partial thickness loss of dermis presenting as a shallow open injury with a red, pink wound bed without slough.  Wound Description (Comments): was present on admission on 3/18/ was  included in another LDA/ see old LDA  Present on Admission: Yes    Physical Exam: Vital Signs Blood pressure (!) 146/83, pulse 65, temperature 97.8 F (36.6 C), resp. rate 16, height 5\' 11"  (1.803 m), weight 75.6 kg, SpO2 96%.  Gen: no distress, normal appearing, laying in bed resting HEENT: oral mucosa pink and moist, NCAT Cardio: Bradycardia Chest: normal effort, normal rate of breathing, CTAB Abd: soft, non-distended, nonTTP, +BS throughout Ext: no edema Psych: pleasant, normal affect Musculoskeletal:     Cervical back: Normal range of motion.     Right lower leg: No edema.     Left lower leg: No edema.     Comments: Mild right hand forearm edema. Right shoulder tender with minimal flexion, IR/ER. Mild shoulder tenderness to touch. Low back tender with bed mobility, flexion/rotation.   Skin:    Comments: Diffuse bruising/ecchymoses as well as skin lacs and abrasions of various stages on all 4 limbs. Right forearm with mild erythema, no warmth--doesn't look a whole lot different than left forearm. A few abrasions dressed.  Neurological:     Mental Status: He is alert and oriented to person, place, and time.     Comments: Pt is alert and oriented to person, place, month, year. Thought it was the 18th. CN exam non-focal. Fair insight and awareness. Able to  provide some biographical information. Focus improved. MMT: RUE limited by shoulder pain 3/5 to 4/5 distally. LUE grossly 4- to 4+/5. RLE and LLE-- 3/5 HF, 3+ KE with pain compontnents. 4+/5 ADF/PF. Found no consistent sensory loss. No cerebellar signs or abnormal reflexes.  Stable 3/28 Psychiatric:        Mood and Affect: Mood normal.        Behavior: Behavior normal.     Comments: Pt is pleasant and cooperative       Assessment/Plan: 1. Functional deficits which require 3+ hours per day of interdisciplinary therapy in a comprehensive inpatient rehab setting. Physiatrist is providing close team supervision and 24 hour  management of active medical problems listed below. Physiatrist and rehab team continue to assess barriers to discharge/monitor patient progress toward functional and medical goals  Care Tool:  Bathing    Body parts bathed by patient: Right arm, Left arm, Chest, Abdomen, Front perineal area, Face, Right upper leg, Left upper leg, Right lower leg, Left lower leg   Body parts bathed by helper: Buttocks     Bathing assist Assist Level: Minimal Assistance - Patient > 75%     Upper Body Dressing/Undressing Upper body dressing   What is the patient wearing?: Pull over shirt    Upper body assist Assist Level: Set up assist    Lower Body Dressing/Undressing Lower body dressing      What is the patient wearing?: Pants, Incontinence brief     Lower body assist Assist for lower body dressing: Contact Guard/Touching assist     Toileting Toileting Toileting Activity did not occur (Clothing management and hygiene only): N/A (no void or bm)  Toileting assist Assist for toileting: 2 Helpers     Transfers Chair/bed transfer  Transfers assist     Chair/bed transfer assist level: Contact Guard/Touching assist     Locomotion Ambulation   Ambulation assist      Assist level: 2 helpers Assistive device: Walker-rolling Max distance: 16ft   Walk 10 feet activity   Assist     Assist level: 2 helpers Assistive device: Walker-rolling   Walk 50 feet activity   Assist Walk 50 feet with 2 turns activity did not occur: Safety/medical concerns         Walk 150 feet activity   Assist Walk 150 feet activity did not occur: Safety/medical concerns         Walk 10 feet on uneven surface  activity   Assist Walk 10 feet on uneven surfaces activity did not occur: Safety/medical concerns         Wheelchair     Assist Is the patient using a wheelchair?: Yes Type of Wheelchair: Manual    Wheelchair assist level: Dependent - Patient 0%      Wheelchair  50 feet with 2 turns activity    Assist        Assist Level: Dependent - Patient 0%   Wheelchair 150 feet activity     Assist      Assist Level: Dependent - Patient 0%   Blood pressure (!) 146/83, pulse 65, temperature 97.8 F (36.6 C), resp. rate 16, height 5\' 11"  (1.803 m), weight 75.6 kg, SpO2 96%.  Medical Problem List and Plan: 1. Functional deficits secondary to acute encephalopathy d/t NPH             -patient may shower             -ELOS/Goals: 21-24 days, supervision to min assist with PT, OT,  SLP  Grounds pass ordered  -continue CIR   Vitamin D supplement started 2.  Antithrombotics: -DVT/anticoagulation:  Pharmaceutical: continue Lovenox 40mg  daily             -antiplatelet therapy: continue Plavix 75mg  daily   3. Low back pain: kpad ordered. Tylenol as needed, asked nursing if kpad has been delivered  Continue lidoderm   4.Anxiety: continue Xanaxq4H prn             -continue triazolam 0.25 mg daily at bedtime             -continue melatonin 3 mg daily at bedtime             -continue trazodone 50 mg daily at bedtime             -antipsychotic agents: n/a             -keep sleep chart 5. Neuropsych/cognition: This patient is  capable of making decisions on his own behalf.   6. Skin/Wound Care: Routine skin care checks             -local care as needed to multiple lacs/abrasions/bruises -10/18/23 nursing stating sacral wound a bit more extensive, WOC consulted for assistance -Zinc supplement started 7. Fluids/Electrolytes/Nutrition: Routine Is and Os and follow-up chemistries             -pt reports good appetite although recorded intake is minimal on flow sheets             -check prealbumin with AM labs 8: Hypertension: monitor TID and prn             Atenolol d/ced due to bradycardia  Xanax increased to q4H prn  Vitals:   10/19/23 1408 10/19/23 1916 10/20/23 0418 10/20/23 1408  BP: 127/78 132/75 109/75 112/67   10/20/23 2025 10/21/23 0405  10/21/23 1324 10/21/23 1931  BP: (!) 142/79 128/80 135/78 137/78   10/22/23 0502 10/22/23 1316 10/22/23 1952 10/23/23 0538  BP: (!) 144/84 105/85 123/64 (!) 146/83    9: Hyperlipidemia: continue simvastatin 40mg  daily   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 Dorris Fetch and plan surveillance CT in 2-3 months   14: Tobacco use: d/c nicotine patch as nicotine can worsen back pain   15: AKI: Cr reviewed and has resolved, stable 10/17/23   16: Thrombocytopenia: reviewed and has resolved   17: Right shoulder pain with glenohumeral joint effusion s/p aspiration 1mL joint fluid - jt fluid with 1800 wbc's and neutrophils 50%, and calcium pyrophosphate crystals; gram stain with rare wbc's, cx pending. 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            Continue voltaren gel             -consider steroid  injection depending upon course             -?outpt f/u with shoulder specialist.   18: Right forearm cellulitis: fever resolved, no leukocytosis -completed course of cefadroxil 500 mg BID for 10 doses  19. Bradycardic: d/c atenolol, HR reviewed and has improved, continue to monitor HR TID  20. Constipation: improved, d/c miralax, last BM 3/26, d/c senna-docusate, last BM 3/27  21. Neck pain: asked PT to perform myofascial release     LOS: 10 days A FACE TO FACE EVALUATION WAS PERFORMED  Drema Pry Izack Hoogland 10/23/2023, 10:31 AM

## 2023-10-24 NOTE — Plan of Care (Signed)
  Problem: Consults Goal: RH GENERAL PATIENT EDUCATION Description: See Patient Education module for education specifics. Outcome: Progressing   Problem: RH BOWEL ELIMINATION Goal: RH STG MANAGE BOWEL WITH ASSISTANCE Description: STG Manage Bowel with toileting Assistance. Outcome: Progressing Goal: RH STG MANAGE BOWEL W/MEDICATION W/ASSISTANCE Description: STG Manage Bowel with Medication with mod I Assistance. Outcome: Progressing   Problem: RH BLADDER ELIMINATION Goal: RH STG MANAGE BLADDER WITH ASSISTANCE Description: STG Manage Bladder With toileting Assistance Outcome: Progressing   Problem: RH SKIN INTEGRITY Goal: RH STG SKIN FREE OF INFECTION/BREAKDOWN Description: Manage skin w min assist Outcome: Progressing Goal: RH STG MAINTAIN SKIN INTEGRITY WITH ASSISTANCE Description: STG Maintain Skin Integrity With min Assistance. Outcome: Progressing   Problem: RH SAFETY Goal: RH STG ADHERE TO SAFETY PRECAUTIONS W/ASSISTANCE/DEVICE Description: STG Adhere to Safety Precautions With cues Assistance/Device. Outcome: Progressing   Problem: RH PAIN MANAGEMENT Goal: RH STG PAIN MANAGED AT OR BELOW PT'S PAIN GOAL Description: < 4 with prns Outcome: Progressing   Problem: RH KNOWLEDGE DEFICIT GENERAL Goal: RH STG INCREASE KNOWLEDGE OF SELF CARE AFTER HOSPITALIZATION Description: Patient and spouse will be able to manage care using educational resources for medications, dietary modifications, safety and skin care independently Outcome: Progressing   Problem: Consults Goal: RH GENERAL PATIENT EDUCATION Description: See Patient Education module for education specifics. Outcome: Progressing

## 2023-10-24 NOTE — Plan of Care (Signed)
  Problem: RH BOWEL ELIMINATION Goal: RH STG MANAGE BOWEL WITH ASSISTANCE Description: STG Manage Bowel with toileting Assistance. Outcome: Progressing   Problem: RH BLADDER ELIMINATION Goal: RH STG MANAGE BLADDER WITH ASSISTANCE Description: STG Manage Bladder With toileting Assistance Outcome: Progressing   Problem: RH SKIN INTEGRITY Goal: RH STG SKIN FREE OF INFECTION/BREAKDOWN Description: Manage skin w min assist Outcome: Progressing   Problem: RH PAIN MANAGEMENT Goal: RH STG PAIN MANAGED AT OR BELOW PT'S PAIN GOAL Description: < 4 with prns Outcome: Progressing

## 2023-10-24 NOTE — Progress Notes (Signed)
 Speech Language Pathology Daily Session Note  Patient Details  Name: Juan Brewer MRN: [id number] Date of Birth: 1946-04-27  Today's Date: 10/24/2023 SLP Individual Time: 1430-1510 SLP Individual Time Calculation (min): 40 min  Short Term Goals: Week 2: SLP Short Term Goal 1 (Week 2): Patient will orient to date and time utilizing available external aids given mod verbal cues. SLP Short Term Goal 2 (Week 2): Patient will recall daily events with 75% accuracy given mod verbal cues. SLP Short Term Goal 3 (Week 2): Patient will demonstrate intellectual awareness of deficits by stating 2 cognitive and 2 physical deficits given mod verbal cues. SLP Short Term Goal 4 (Week 2): Patient will attend to functional therapy tasks for 15 minutes given mod verbal cues. SLP Short Term Goal 5 (Week 2): Patient will solve basic functional problems with 75% accuracy given mod verbal cues.  Skilled Therapeutic Interventions: Skilled therapy session focused on cognitive goals. SLP facilitated session by providing minA during meal budgeting task. Patient was prompted to create different two meals for 5 people under $50 each. Once patient decided on the meals, he a created a grocery list with minA. Using grocery list, patient chose items off [employer] website to "add to cart" and stay in budget. Patient independently oriented to self, situation, location and time. Patient became frustrated at the end of the session stating "I am not answering any more questions" and session was d/c. 5 minutes of session missed. Patient left in bed with alarm set and call bell in reach. Continue POC.     Pain None reported  Therapy/Group: Individual Therapy  Kayci Belleville M.A., CCC-SLP 10/24/2023, 3:46 PM

## 2023-10-24 NOTE — Progress Notes (Signed)
 PROGRESS NOTE   Subjective/Complaints:  Pt doing well, slept ok, denies pain this morning, LBM 2 days ago which is not uncommon for him, urinating fine, denies any other complaints or concerns today.    ROS: +low back pain--improving. Per HPI. Denies CP, SOB, abd pain, n/v/d/c, +anxiety, +fatigue  Objective:   No results found.   No results for input(s): "WBC", "HGB", "HCT", "PLT" in the last 72 hours.   No results for input(s): "NA", "K", "CL", "CO2", "GLUCOSE", "BUN", "CREATININE", "CALCIUM" in the last 72 hours.   Intake/Output Summary (Last 24 hours) at 10/24/2023 1035 Last data filed at 10/23/2023 1939 Gross per 24 hour  Intake 240 ml  Output 200 ml  Net 40 ml     Pressure Injury 10/18/23 Sacrum Right;Left;Medial Deep Tissue Pressure Injury - Purple or maroon localized area of discolored intact skin or blood-filled blister due to damage of underlying soft tissue from pressure and/or shear. wound present on 3/18 ad (Active)  10/18/23 0400  Location: Sacrum  Location Orientation: Right;Left;Medial  Staging: Deep Tissue Pressure Injury - Purple or maroon localized area of discolored intact skin or blood-filled blister due to damage of underlying soft tissue from pressure and/or shear.  Wound Description (Comments): wound present on 3/18 admission/ see old LDA- evolved into a DTI on 10/18/23  Present on Admission: Yes (previosuly documented as stage I- classification changed)     Pressure Injury 10/18/23 Buttocks Right Stage 2 -  Partial thickness loss of dermis presenting as a shallow open injury with a red, pink wound bed without slough. was present on admission on 3/18/ was included in another LDA/ see old LDA (Active)  10/18/23 0415  Location: Buttocks  Location Orientation: Right  Staging: Stage 2 -  Partial thickness loss of dermis presenting as a shallow open injury with a red, pink wound bed without slough.   Wound Description (Comments): was present on admission on 3/18/ was included in another LDA/ see old LDA  Present on Admission: Yes    Physical Exam: Vital Signs Blood pressure 114/62, pulse 61, temperature 97.6 F (36.4 C), temperature source Oral, resp. rate 18, height 5\' 11"  (1.803 m), weight 75.6 kg, SpO2 96%.  Gen: no distress, normal appearing, laying in bed resting HEENT: oral mucosa pink and moist, NCAT Cardio: RRR, no m/r/g appreciated Chest: normal effort, normal rate of breathing, CTAB Abd: soft, non-distended, nonTTP, +BS throughout Ext: no edema Psych: pleasant, normal affect  PRIOR EXAMS: Musculoskeletal:     Cervical back: Normal range of motion.     Right lower leg: No edema.     Left lower leg: No edema.     Comments: Mild right hand forearm edema. Right shoulder tender with minimal flexion, IR/ER. Mild shoulder tenderness to touch. Low back tender with bed mobility, flexion/rotation.   Skin:    Comments: Diffuse bruising/ecchymoses as well as skin lacs and abrasions of various stages on all 4 limbs. Right forearm with mild erythema, no warmth--doesn't look a whole lot different than left forearm. A few abrasions dressed.  Neurological:     Mental Status: He is alert and oriented to person, place, and time.     Comments:  Pt is alert and oriented to person, place, month, year. Thought it was the 18th. CN exam non-focal. Fair insight and awareness. Able to provide some biographical information. Focus improved. MMT: RUE limited by shoulder pain 3/5 to 4/5 distally. LUE grossly 4- to 4+/5. RLE and LLE-- 3/5 HF, 3+ KE with pain compontnents. 4+/5 ADF/PF. Found no consistent sensory loss. No cerebellar signs or abnormal reflexes.  Stable 3/28 Psychiatric:        Mood and Affect: Mood normal.        Behavior: Behavior normal.     Comments: Pt is pleasant and cooperative       Assessment/Plan: 1. Functional deficits which require 3+ hours per day of interdisciplinary  therapy in a comprehensive inpatient rehab setting. Physiatrist is providing close team supervision and 24 hour management of active medical problems listed below. Physiatrist and rehab team continue to assess barriers to discharge/monitor patient progress toward functional and medical goals  Care Tool:  Bathing    Body parts bathed by patient: Right arm, Left arm, Chest, Abdomen, Front perineal area, Face, Right upper leg, Left upper leg, Right lower leg, Left lower leg   Body parts bathed by helper: Buttocks     Bathing assist Assist Level: Minimal Assistance - Patient > 75%     Upper Body Dressing/Undressing Upper body dressing   What is the patient wearing?: Pull over shirt    Upper body assist Assist Level: Set up assist    Lower Body Dressing/Undressing Lower body dressing      What is the patient wearing?: Pants, Incontinence brief     Lower body assist Assist for lower body dressing: Contact Guard/Touching assist     Toileting Toileting Toileting Activity did not occur (Clothing management and hygiene only): N/A (no void or bm)  Toileting assist Assist for toileting: Moderate Assistance - Patient 50 - 74%     Transfers Chair/bed transfer  Transfers assist     Chair/bed transfer assist level: Minimal Assistance - Patient > 75%     Locomotion Ambulation   Ambulation assist      Assist level: 2 helpers Assistive device: Walker-rolling Max distance: 1ft   Walk 10 feet activity   Assist     Assist level: 2 helpers Assistive device: Walker-rolling   Walk 50 feet activity   Assist Walk 50 feet with 2 turns activity did not occur: Safety/medical concerns         Walk 150 feet activity   Assist Walk 150 feet activity did not occur: Safety/medical concerns         Walk 10 feet on uneven surface  activity   Assist Walk 10 feet on uneven surfaces activity did not occur: Safety/medical concerns          Wheelchair     Assist Is the patient using a wheelchair?: Yes Type of Wheelchair: Manual    Wheelchair assist level: Dependent - Patient 0%      Wheelchair 50 feet with 2 turns activity    Assist        Assist Level: Dependent - Patient 0%   Wheelchair 150 feet activity     Assist      Assist Level: Dependent - Patient 0%   Blood pressure 114/62, pulse 61, temperature 97.6 F (36.4 C), temperature source Oral, resp. rate 18, height 5\' 11"  (1.803 m), weight 75.6 kg, SpO2 96%.  Medical Problem List and Plan: 1. Functional deficits secondary to acute encephalopathy d/t NPH             -  patient may shower -ELOS/Goals: 21-24 days, supervision to min assist with PT, OT, SLP, goal 10/27/23  Grounds pass ordered  -continue CIR   Vitamin D supplement started 2.  Antithrombotics: -DVT/anticoagulation:  Pharmaceutical: continue Lovenox 40mg  daily             -antiplatelet therapy: continue Plavix 75mg  daily   3. Low back pain: kpad ordered. Tylenol as needed, asked nursing if kpad has been delivered  Continue lidoderm   4.Anxiety: continue Xanaxq4H prn             -continue triazolam 0.25 mg daily at bedtime             -continue melatonin 3 mg daily at bedtime             -continue trazodone 50 mg daily at bedtime             -antipsychotic agents: n/a             -keep sleep chart 5. Neuropsych/cognition: This patient is  capable of making decisions on his own behalf.   6. Skin/Wound Care: Routine skin care checks             -local care as needed to multiple lacs/abrasions/bruises -10/18/23 nursing stating sacral wound a bit more extensive, WOC consulted for assistance -Zinc supplement started 7. Fluids/Electrolytes/Nutrition: Routine Is and Os and follow-up chemistries             -pt reports good appetite although recorded intake is minimal on flow sheets             -check prealbumin with AM labs 8: Hypertension: monitor TID and prn             Atenolol  d/ced due to bradycardia  Xanax increased to q4H prn  -10/24/23 BP intermittently elevated but mostly stable/good, monitor  Vitals:   10/20/23 1408 10/20/23 2025 10/21/23 0405 10/21/23 1324  BP: 112/67 (!) 142/79 128/80 135/78   10/21/23 1931 10/22/23 0502 10/22/23 1316 10/22/23 1952  BP: 137/78 (!) 144/84 105/85 123/64   10/23/23 0538 10/23/23 1429 10/23/23 1937 10/24/23 0500  BP: (!) 146/83 117/67 (!) 141/84 114/62    9: Hyperlipidemia: continue simvastatin 40mg  daily   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 Dorris Fetch and plan surveillance CT in 2-3 months   14: Tobacco use: d/c nicotine patch as nicotine can worsen back pain   15: AKI: Cr reviewed and has resolved, stable 10/17/23   16: Thrombocytopenia: reviewed and has resolved   17: Right shoulder pain with glenohumeral joint effusion s/p aspiration 1mL joint fluid - jt fluid with 1800 wbc's and neutrophils 50%, and calcium pyrophosphate crystals; gram stain with rare wbc's, cx pending. 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  Continue voltaren gel             -consider steroid injection depending upon course             -?outpt f/u with shoulder specialist.   18: Right forearm cellulitis: fever resolved, no leukocytosis -completed course of cefadroxil 500 mg BID for 10 doses  19. Bradycardic: d/c atenolol, HR reviewed and has improved, continue to monitor HR TID  20. Constipation: improved, d/c miralax, last BM 3/26, d/c  senna-docusate, last BM 3/27 -10/24/23 LBM 2 days ago, if no BM by tomorrow then may need miralax restarted  21. Neck pain: asked PT to perform myofascial release     LOS: 11 days A FACE TO FACE EVALUATION WAS PERFORMED  [address] 10/24/2023, 10:35 AM

## 2023-10-24 NOTE — Progress Notes (Signed)
 Physical Therapy Session Note  Patient Details  Name: Juan Brewer MRN: [id number] Date of Birth: 1945-07-30  Today's Date: 10/24/2023 PT Individual Time: 0917-1000 PT Individual Time Calculation (min): 43 min   Short Term Goals: Week 1:  PT Short Term Goal 1 (Week 1): Pt will complete bed mobility with modA PT Short Term Goal 1 - Progress (Week 1): Met PT Short Term Goal 2 (Week 1): Pt will complete bed<>chair transfers with modA PT Short Term Goal 2 - Progress (Week 1): Met PT Short Term Goal 3 (Week 1): Pt will ambulate 98ft with modA and LRAD PT Short Term Goal 3 - Progress (Week 1): Met PT Short Term Goal 4 (Week 1): Pt will tolerate sitting OOB for > 1 hour between therapy sessions PT Short Term Goal 4 - Progress (Week 1): Met  Skilled Therapeutic Interventions/Progress Updates: Pt presents supine in bed and requesting need for BM.  Pt required cueing as well as increased time for processing transfers to sit.  Pt required only supervision to accomplish using side rails, although transferred back to sidelying x 1 and repeating proess.  Pt performed squat pivot to w//c w/ mod A to complete turn.  Pt refused use of RW into BR.  Pt required min A sit to stand and step to toilet, mod A for clothing management.  Pt was incontinent in brief and then continent of bowel in toilet, charted in Flowsheets.  Clean brief donned and then stood for mod A to pull up brief and shorts.  Pt amb to bed w/ RW and min A, but constant cues to keep hands on RW, pt reaches for external support.  Pt requested to lie down and unable to convince to sit in w/c.  Pt agreed to get OOB to wash hands, brush teeth and shave.  Pt again required increased time to muscle plan transfer to EOB.  Pt performs squat pivot w/ min A and cues.  Pt unwilling to stand to perform hygiene.  Pt returned to sitting EOB w/ min A and then returned to supine w/ supervision.  Bed alarm on and all needs in reach.      Therapy  Documentation Precautions:  Precautions Precautions: Fall Precaution/Restrictions Comments: Fear of Falling Restrictions Weight Bearing Restrictions Per Provider Order: No General:   Vital Signs: Oxygen Therapy SpO2: 96 % O2 Device: Room Air Pain:0/10      Therapy/Group: Individual Therapy  Lucio Edward 10/24/2023, 10:01 AM

## 2023-10-24 NOTE — Progress Notes (Signed)
 Changing patient's shirt this morning crepitus assessed from right shoulder down to right wrist. Left arm no crepitus assessed. Scheduled pain cream and patch applied. Was repositioned in bed to position of comfort.

## 2023-10-25 NOTE — Progress Notes (Signed)
 PROGRESS NOTE   Subjective/Complaints:  Pt doing well, slept well, pain well managed, LBM yesterday per pt but no documentation of this. Urinating fine, denies any other complaints or concerns today.    ROS: +low back pain--improving. Per HPI. Denies CP, SOB, abd pain, n/v/d/c, +anxiety, +fatigue  Objective:   No results found.   No results for input(s): "WBC", "HGB", "HCT", "PLT" in the last 72 hours.   No results for input(s): "NA", "K", "CL", "CO2", "GLUCOSE", "BUN", "CREATININE", "CALCIUM" in the last 72 hours.   Intake/Output Summary (Last 24 hours) at 10/25/2023 1024 Last data filed at 10/24/2023 1827 Gross per 24 hour  Intake 420 ml  Output 375 ml  Net 45 ml     Pressure Injury 10/18/23 Sacrum Right;Left;Medial Deep Tissue Pressure Injury - Purple or maroon localized area of discolored intact skin or blood-filled blister due to damage of underlying soft tissue from pressure and/or shear. wound present on 3/18 ad (Active)  10/18/23 0400  Location: Sacrum  Location Orientation: Right;Left;Medial  Staging: Deep Tissue Pressure Injury - Purple or maroon localized area of discolored intact skin or blood-filled blister due to damage of underlying soft tissue from pressure and/or shear.  Wound Description (Comments): wound present on 3/18 admission/ see old LDA- evolved into a DTI on 10/18/23  Present on Admission: Yes (previosuly documented as stage I- classification changed)     Pressure Injury 10/18/23 Buttocks Right Stage 2 -  Partial thickness loss of dermis presenting as a shallow open injury with a red, pink wound bed without slough. was present on admission on 3/18/ was included in another LDA/ see old LDA (Active)  10/18/23 0415  Location: Buttocks  Location Orientation: Right  Staging: Stage 2 -  Partial thickness loss of dermis presenting as a shallow open injury with a red, pink wound bed without slough.   Wound Description (Comments): was present on admission on 3/18/ was included in another LDA/ see old LDA  Present on Admission: Yes    Physical Exam: Vital Signs Blood pressure 124/74, pulse 60, temperature (!) 97.3 F (36.3 C), temperature source Oral, resp. rate 17, height 5\' 11"  (1.803 m), weight 75.6 kg, SpO2 92%.  Gen: no distress, normal appearing, laying in bed resting comfortably HEENT: oral mucosa pink and moist, NCAT Cardio: RRR, no m/r/g appreciated Chest: normal effort, normal rate of breathing, CTAB Abd: soft, non-distended, nonTTP, +BS throughout Ext: no edema Psych: pleasant, normal affect  PRIOR EXAMS: Musculoskeletal:     Cervical back: Normal range of motion.     Right lower leg: No edema.     Left lower leg: No edema.     Comments: Mild right hand forearm edema. Right shoulder tender with minimal flexion, IR/ER. Mild shoulder tenderness to touch. Low back tender with bed mobility, flexion/rotation.   Skin:    Comments: Diffuse bruising/ecchymoses as well as skin lacs and abrasions of various stages on all 4 limbs. Right forearm with mild erythema, no warmth--doesn't look a whole lot different than left forearm. A few abrasions dressed.  Neurological:     Mental Status: He is alert and oriented to person, place, and time.     Comments:  Pt is alert and oriented to person, place, month, year. Thought it was the 18th. CN exam non-focal. Fair insight and awareness. Able to provide some biographical information. Focus improved. MMT: RUE limited by shoulder pain 3/5 to 4/5 distally. LUE grossly 4- to 4+/5. RLE and LLE-- 3/5 HF, 3+ KE with pain compontnents. 4+/5 ADF/PF. Found no consistent sensory loss. No cerebellar signs or abnormal reflexes.  Stable 3/28 Psychiatric:        Mood and Affect: Mood normal.        Behavior: Behavior normal.     Comments: Pt is pleasant and cooperative       Assessment/Plan: 1. Functional deficits which require 3+ hours per day of  interdisciplinary therapy in a comprehensive inpatient rehab setting. Physiatrist is providing close team supervision and 24 hour management of active medical problems listed below. Physiatrist and rehab team continue to assess barriers to discharge/monitor patient progress toward functional and medical goals  Care Tool:  Bathing    Body parts bathed by patient: Right arm, Left arm, Chest, Abdomen, Front perineal area, Face, Right upper leg, Left upper leg, Right lower leg, Left lower leg   Body parts bathed by helper: Buttocks     Bathing assist Assist Level: Minimal Assistance - Patient > 75%     Upper Body Dressing/Undressing Upper body dressing   What is the patient wearing?: Pull over shirt    Upper body assist Assist Level: Set up assist    Lower Body Dressing/Undressing Lower body dressing      What is the patient wearing?: Pants, Incontinence brief     Lower body assist Assist for lower body dressing: Contact Guard/Touching assist     Toileting Toileting Toileting Activity did not occur (Clothing management and hygiene only): N/A (no void or bm)  Toileting assist Assist for toileting: Moderate Assistance - Patient 50 - 74%     Transfers Chair/bed transfer  Transfers assist     Chair/bed transfer assist level: Minimal Assistance - Patient > 75%     Locomotion Ambulation   Ambulation assist      Assist level: 2 helpers Assistive device: Walker-rolling Max distance: 40ft   Walk 10 feet activity   Assist     Assist level: 2 helpers Assistive device: Walker-rolling   Walk 50 feet activity   Assist Walk 50 feet with 2 turns activity did not occur: Safety/medical concerns         Walk 150 feet activity   Assist Walk 150 feet activity did not occur: Safety/medical concerns         Walk 10 feet on uneven surface  activity   Assist Walk 10 feet on uneven surfaces activity did not occur: Safety/medical concerns          Wheelchair     Assist Is the patient using a wheelchair?: Yes Type of Wheelchair: Manual    Wheelchair assist level: Dependent - Patient 0%      Wheelchair 50 feet with 2 turns activity    Assist        Assist Level: Dependent - Patient 0%   Wheelchair 150 feet activity     Assist      Assist Level: Dependent - Patient 0%   Blood pressure 124/74, pulse 60, temperature (!) 97.3 F (36.3 C), temperature source Oral, resp. rate 17, height 5\' 11"  (1.803 m), weight 75.6 kg, SpO2 92%.  Medical Problem List and Plan: 1. Functional deficits secondary to acute encephalopathy d/t NPH             -  patient may shower -ELOS/Goals: 21-24 days, supervision to min assist with PT, OT, SLP, goal 10/27/23  Grounds pass ordered  -continue CIR   Vitamin D supplement started 2.  Antithrombotics: -DVT/anticoagulation:  Pharmaceutical: continue Lovenox 40mg  daily             -antiplatelet therapy: continue Plavix 75mg  daily   3. Low back pain: kpad ordered. Tylenol as needed, asked nursing if kpad has been delivered  Continue lidoderm   4.Anxiety: continue Xanaxq4H prn             -continue triazolam 0.25 mg daily at bedtime             -continue melatonin 3 mg daily at bedtime             -continue trazodone 50 mg daily at bedtime             -antipsychotic agents: n/a             -keep sleep chart 5. Neuropsych/cognition: This patient is  capable of making decisions on his own behalf.   6. Skin/Wound Care: Routine skin care checks             -local care as needed to multiple lacs/abrasions/bruises -10/18/23 nursing stating sacral wound a bit more extensive, WOC consulted for assistance -Zinc supplement started 7. Fluids/Electrolytes/Nutrition: Routine Is and Os and follow-up chemistries             -pt reports good appetite although recorded intake is minimal on flow sheets             -check prealbumin with AM labs 8: Hypertension: monitor TID and prn              Atenolol d/ced due to bradycardia  Xanax increased to q4H prn  -10/24/23 BP intermittently elevated but mostly stable/good, monitor  -10/25/23 BP better, cont regimen Vitals:   10/21/23 1931 10/22/23 0502 10/22/23 1316 10/22/23 1952  BP: 137/78 (!) 144/84 105/85 123/64   10/23/23 0538 10/23/23 1429 10/23/23 1937 10/24/23 0500  BP: (!) 146/83 117/67 (!) 141/84 114/62   10/24/23 1524 10/24/23 2042 10/24/23 2043 10/25/23 0447  BP: 111/72 108/73 108/73 124/74    9: Hyperlipidemia: continue simvastatin 40mg  daily   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 Dorris Fetch and plan surveillance CT in 2-3 months   14: Tobacco use: d/c nicotine patch as nicotine can worsen back pain   15: AKI: Cr reviewed and has resolved, stable 10/17/23   16: Thrombocytopenia: reviewed and has resolved   17: Right shoulder pain with glenohumeral joint effusion s/p aspiration 1mL joint fluid - jt fluid with 1800 wbc's and neutrophils 50%, and calcium pyrophosphate crystals; gram stain with rare wbc's, cx pending. 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            Continue voltaren gel             -consider steroid injection depending upon course             -?outpt f/u with shoulder specialist.   18: Right forearm cellulitis: fever resolved, no leukocytosis -completed course of cefadroxil 500 mg BID for 10 doses  19. Bradycardic: d/c atenolol, HR reviewed and has improved, continue to monitor HR TID  20. Constipation: improved,  d/c miralax, last BM 3/26, d/c senna-docusate, last BM 3/27 -10/24/23 LBM 2 days ago, if no BM by tomorrow then may need miralax restarted -10/25/23 per pt, had a BM yesterday, but this wasn't documented; will ask nursing to please document if BM today but if not then could consider restarting miralax  21. Neck pain: asked PT to perform myofascial release     LOS: 12 days A FACE TO FACE EVALUATION WAS PERFORMED  [address] Dawn[address] Copenhaver 10/25/2023, 10:24 AM

## 2023-10-25 NOTE — Plan of Care (Signed)
  Problem: Consults Goal: RH GENERAL PATIENT EDUCATION Description: See Patient Education module for education specifics. Outcome: Progressing   Problem: RH BOWEL ELIMINATION Goal: RH STG MANAGE BOWEL WITH ASSISTANCE Description: STG Manage Bowel with toileting Assistance. Outcome: Progressing Goal: RH STG MANAGE BOWEL W/MEDICATION W/ASSISTANCE Description: STG Manage Bowel with Medication with mod I Assistance. Outcome: Progressing   Problem: RH BLADDER ELIMINATION Goal: RH STG MANAGE BLADDER WITH ASSISTANCE Description: STG Manage Bladder With toileting Assistance Outcome: Progressing   Problem: RH SKIN INTEGRITY Goal: RH STG SKIN FREE OF INFECTION/BREAKDOWN Description: Manage skin w min assist Outcome: Progressing Goal: RH STG MAINTAIN SKIN INTEGRITY WITH ASSISTANCE Description: STG Maintain Skin Integrity With min Assistance. Outcome: Progressing   Problem: RH SAFETY Goal: RH STG ADHERE TO SAFETY PRECAUTIONS W/ASSISTANCE/DEVICE Description: STG Adhere to Safety Precautions With cues Assistance/Device. Outcome: Progressing   Problem: RH PAIN MANAGEMENT Goal: RH STG PAIN MANAGED AT OR BELOW PT'S PAIN GOAL Description: < 4 with prns Outcome: Progressing

## 2023-10-25 NOTE — Progress Notes (Signed)
 Physical Therapy Session Note  Patient Details  Name: Juan Brewer MRN: [id number] Date of Birth: [birth year]  Today's Date: 10/25/2023 PT Individual Time: 1300-1345 PT Individual Time Calculation (min): 45 min   Short Term Goals: Week 2:  PT Short Term Goal 1 (Week 2): STG = LTG due to ELOS  Skilled Therapeutic Interventions/Progress Updates:      Therapy Documentation Precautions:  Precautions Precautions: Fall Precaution/Restrictions Comments: Fear of Falling Restrictions Weight Bearing Restrictions Per Provider Order: No   Pt agreeable to PT session with emphasis on global strength and gait training. Pt without reports of pain and requires increased time with minimal cueing to initiate mobility. Set-up for lower body dressing and supervision with supine>sit from flat surface. CGA with stand pivot without AD to w/c and dependently transported to rehab hallway for gait training. Initially pt CGA/min A gait with left hand rail ~30' and progressed to CGA with cues for pacing with gait while pushing shopping cart 45' + 85'. Pt provided with music for pacing as well. Pt returned to room, left seated on toilet with spouse present and nurse in route.     Therapy/Group: Individual Therapy  Truitt Leep Truitt Leep PT, DPT  10/25/2023, 4:22 PM

## 2023-10-25 NOTE — Progress Notes (Signed)
 Occupational Therapy Session Note  Patient Details  Name: Juan Brewer MRN: [id number] Date of Birth: [birth year]  Today's Date: 10/25/2023 OT Individual Time: 4098-1191 OT Individual Time Calculation (min): 50 min   Short Term Goals: Week 2:  OT Short Term Goal 1 (Week 2): Patient will transition to sitting at edge of bed with close supervision OT Short Term Goal 2 (Week 2): Patient will complete a stand step transfer to wheelchair/toilet/ shower seat with no more than contact guard assistance OT Short Term Goal 3 (Week 2): Patient will remain out of bed x 1 hour at least 3 times/day to improve overall endurance.  Skilled Therapeutic Interventions/Progress Updates:  Pt received resting in bed, no reports of pain. Increased time required to reach sitting EOB. Supervision for transfer once initiated. Pt declines ambulating walk-in shower transfer, stating ". . Missy Sabins go in that wheelchair." Stand-pivots throughout session  subsequently at [facility] + no AD. Full-body bathing at shower level with supervision + use of grab bar for standing pericare, Min A for thoroughness, Mod verbal cuing for attention to remainder of body as patient washes his hair/face then turns water off. Sink-side grooming with supervision, cuing to rinse toothbrush post-oral care. LB clothing management with CGA for standing doff/hike, increased time and light Min A for unthreading/threading. Setup/supervision for UB garments and socks. Pt remained resting in bed all needs within reach, bed alarm activated.   Therapy Documentation Precautions:  Precautions Precautions: Fall Precaution/Restrictions Comments: Fear of Falling Restrictions Weight Bearing Restrictions Per Provider Order: No   Therapy/Group: Individual Therapy  Lou Cal, OTR/L, MSOT  10/25/2023, 5:54 AM

## 2023-10-25 NOTE — Progress Notes (Signed)
 Speech Language Pathology Daily Session Note  Patient Details  Name: Juan Brewer MRN: [id number] Date of Birth: [birth year]  Today's Date: 10/25/2023 SLP Individual Time: 0900-1000 SLP Individual Time Calculation (min): 60 min  Short Term Goals: Week 2: SLP Short Term Goal 1 (Week 2): Patient will orient to date and time utilizing available external aids given mod verbal cues. SLP Short Term Goal 2 (Week 2): Patient will recall daily events with 75% accuracy given mod verbal cues. SLP Short Term Goal 3 (Week 2): Patient will demonstrate intellectual awareness of deficits by stating 2 cognitive and 2 physical deficits given mod verbal cues. SLP Short Term Goal 4 (Week 2): Patient will attend to functional therapy tasks for 15 minutes given mod verbal cues. SLP Short Term Goal 5 (Week 2): Patient will solve basic functional problems with 75% accuracy given mod verbal cues.  Skilled Therapeutic Interventions:  Patient was seen for skilled ST services targeting cognition.  Upon SLP entry patient was watching TV and was a little resistant to completing ST. SLP spent the first 10 minutes of session gaining rapport and collecting personally relevant information to increase motivation to complete therapeutic tasks. Pt was oriented x4.  Patient was able to demonstrate awareness of  the following deficits with minimum prompting, reporting re: taking longer to eat, trouble remembering, and weakness. SLP facilitated attention, organization, and memory through therapeutic task re: divergent naming paired with alternating attention (food category - alternating letters with therapist).  To ensure participation, SLP let patient know he could complete task with his eyes closed. Pt required extended time and moderate verbal cueing to complete task. SLP educated on brain breaks. Pt asked for a brain break midway through task. SLP took 3 minute break and then pt was encouraged to continue. Pt was left in bed with  all safety measures activated and immediate needs within reach. Continue with ST POC.   Pain Pain Assessment Pain Score: 0-No pain  Therapy/Group: Individual Therapy  Ann Lions 10/25/2023, 12:42 PM

## 2023-10-26 DIAGNOSIS — G479 Sleep disorder, unspecified: Secondary | ICD-10-CM

## 2023-10-26 LAB — BASIC METABOLIC PANEL WITH GFR
Anion gap: 6 (ref 5–15)
BUN: 17 mg/dL (ref 8–23)
CO2: 25 mmol/L (ref 22–32)
Calcium: 9.5 mg/dL (ref 8.9–10.3)
Chloride: 109 mmol/L (ref 98–111)
Creatinine, Ser: 1.4 mg/dL — ABNORMAL HIGH (ref 0.61–1.24)
GFR, Estimated: 52 mL/min — ABNORMAL LOW (ref >60)
Glucose, Bld: 109 mg/dL — ABNORMAL HIGH (ref 70–99)
Potassium: 3.8 mmol/L (ref 3.5–5.1)
Sodium: 140 mmol/L (ref 135–145)

## 2023-10-26 LAB — CBC
HCT: 34.3 % — ABNORMAL LOW (ref 39.0–52.0)
Hemoglobin: 11.6 g/dL — ABNORMAL LOW (ref 13.0–17.0)
MCH: 30.7 pg (ref 26.0–34.0)
MCHC: 33.8 g/dL (ref 30.0–36.0)
MCV: 90.7 fL (ref 80.0–100.0)
Platelets: 102 10*3/uL — ABNORMAL LOW (ref 150–400)
RBC: 3.78 MIL/uL — ABNORMAL LOW (ref 4.22–5.81)
RDW: 13.9 % (ref 11.5–15.5)
WBC: 4.5 10*3/uL (ref 4.0–10.5)
nRBC: 0 % (ref 0.0–0.2)

## 2023-10-26 NOTE — Progress Notes (Signed)
 Physical Therapy Session Note  Patient Details  Name: Juan Brewer MRN: [id number] Date of Birth: [birth year]  Today's Date: 10/26/2023 PT Individual Time: 4098-1191 + 4782-9562 PT Individual Time Calculation (min): 29 min  + 45 min  Short Term Goals: Week 2:  PT Short Term Goal 1 (Week 2): STG = LTG due to ELOS  Skilled Therapeutic Interventions/Progress Updates:      1st session: Pt in bed talking on the phone to family/friend - agreeable to PT tx. No complaints of pain. Donned sweat pants at bed level with minA for threading. Pt able to bridge in bed to pull over hips. Multiple bridges done to complete.  Supine<>sitting EOB with supervision with ++ time for initiation and processing. Sit<>stand to RW with CGA with cues for hand placement - assist needed for pulling pants up in standing. Completed stand pivot transfer with CGA into wheelchair with cues for setup and sequencing.  Transported to ortho gym to practice car transfers. Completed car transfer with CGA with no AD - relies on UE to pull from handles and car door to stand and help with stability while transferring.  Returned to his room and pt requests to brush teeth at the sink - completed while sitting in wheelchair. Pt requesting to return to bed despite encouragement to remain sitting upright. Pt completes stand pivot with minA back to bed and bed mobility completed at supervision level. All needs me.t     2nd session: Session focused on family training and education with wife, Aurther Loft. Pt in bed and agreeable to therapy treatment. Pt aware of DC plan home tomorrow. Wife, Aurther Loft, reports confidence and understanding of general precautions and patient's functional mobility limitations.  Supine<>sitting EOB with supervision assist. Completed stand pivot transfer with CGA into wheelchair with no AD - cues needed for hand placement to wheelchair arm rests to assist with balance during transfer.  Wheeled to main rehab gym to  review stair training. Patient navigated up/down x8, 3" steps + up/down x4 6" steps with CGA/minA and 2 hand rails. Pt completes the smaller steps with a reciprocal stepping pattern and a step-to pattern for the larger steps.   6 minute walk test completed with RW and CGA - pt ambulates 127ft total before needing a seated rest break at the end of the 6 minutes. Gait distance limited by fatigue. Pt demonstrates improved reciprocal stepping pattern with full foot clearance and more of an upright posture compared to prior days.   Attempted to continue therapy with Nustep and other forms of functional mobility but patient refused and wanting to return to his bed. Ended session with his needs met in his room . Missed 15 minutes of therapy.      Therapy Documentation Precautions:  Precautions Precautions: Fall Precaution/Restrictions Comments: Fear of Falling Restrictions Weight Bearing Restrictions Per Provider Order: No General:    Therapy/Group: Individual Therapy  Apryll Hinkle P Dallin Mccorkel 10/26/2023, 7:45 AM

## 2023-10-26 NOTE — Progress Notes (Signed)
 Occupational Therapy Discharge Summary  Patient Details  Name: Juan Brewer MRN: [id number] Date of Birth: 12-Jun-1946  Date of Discharge from OT service:October 26, 2023   Patient has met 12 of 12 long term goals due to improved activity tolerance, improved balance, and improved coordination.  Patient to discharge at [facility] Assist level.  Patient's care partner is independent to provide the necessary physical and cognitive assistance at discharge. [name] completed hands on training/education prior to DC regarding functional transfer and ADL recommendations and verbalized their readiness to assist pt at their CLOF.   All goals met  Recommendation:  Patient will benefit from ongoing skilled OT services in home health setting to continue to advance functional skills in the area of BADL and Reduce care partner burden.  Equipment: No equipment provided  Reasons for discharge: treatment goals met and discharge from hospital  Patient/family agrees with progress made and goals achieved: Yes  OT Discharge Precautions/Restrictions  Precautions Precautions: Fall Precaution/Restrictions Comments: Fear of Falling Restrictions Weight Bearing Restrictions Per Provider Order: No ADL ADL Eating: Supervision/safety Where Assessed-Eating: Wheelchair Grooming: Supervision/safety Where Assessed-Grooming: Sitting at sink Upper Body Bathing: Supervision/safety Where Assessed-Upper Body Bathing: Shower Lower Body Bathing: Contact guard Where Assessed-Lower Body Bathing: Shower Upper Body Dressing: Supervision/safety Where Assessed-Upper Body Dressing: Edge of bed Lower Body Dressing: Minimal assistance Where Assessed-Lower Body Dressing: Edge of bed Toileting: Minimal assistance Where Assessed-Toileting: Teacher, adult education: Curator Method: Proofreader: Raised toilet seat, Grab bars Tub/Shower Transfer: Minimal  assistance Web designer Method: Ship broker: Insurance underwriter: Insurance underwriter Method: Designer, industrial/product: Emergency planning/management officer, Grab bars Vision Baseline Vision/History: 1 Wears glasses Patient Visual Report: No change from baseline Vision Assessment?: No apparent visual deficits Perception  Perception: Impaired Perception-Other Comments: 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 Praxis Praxis: Impaired Praxis Impairment Details: Initiation Cognition Cognition Overall Cognitive Status: Impaired/Different from baseline Arousal/Alertness: Awake/alert Orientation Level: Person;Place;Situation Person: Oriented Place: Oriented Situation: Disoriented Memory: Impaired Memory Impairment: Storage deficit;Retrieval deficit;Decreased recall of new information Attention: Sustained;Selective Sustained Attention: Impaired Sustained Attention Impairment: Verbal basic;Functional basic Selective Attention: Impaired Selective Attention Impairment: Verbal basic;Functional basic Awareness: Impaired Awareness Impairment: Emergent impairment Problem Solving: Impaired Problem Solving Impairment: Verbal complex;Functional complex Executive Function: Organizing;Self Monitoring;Self Correcting Organizing: Impaired Organizing Impairment: Functional basic Self Monitoring: Impaired Self Monitoring Impairment: Functional basic Self Correcting: Impaired Safety/Judgment: Impaired Brief Interview for Mental Status (BIMS) Repetition of Three Words (First Attempt): 3 Temporal Orientation: Year: Correct Temporal Orientation: Month: Accurate within 5 days Temporal Orientation: Day: Correct Recall: "Sock": No, could not recall Recall: "Blue": Yes, no cue required Recall: "Bed": No, could not recall BIMS Summary Score: 11 Sensation Sensation Light Touch: Impaired Detail Light Touch Impaired Details: Impaired  RUE;Impaired RLE Hot/Cold: Appears Intact Proprioception: Appears Intact Stereognosis: Not tested Coordination Gross Motor Movements are Fluid and Coordinated: No Fine Motor Movements are Fluid and Coordinated: No Coordination and Movement Description: Global weakness and deconditioning; fear of falling inhibiting movement and effort Motor  Motor Motor: Hemiplegia;Abnormal postural alignment and control;Motor apraxia Motor - Skilled Clinical Observations: 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 Mobility  Bed Mobility Bed Mobility: Sit to Supine;Supine to Sit;Rolling Left;Rolling Right Rolling Right: Supervision/verbal cueing Rolling Left: Supervision/Verbal cueing Supine to Sit: Supervision/Verbal cueing Sit to Supine: Supervision/Verbal cueing Transfers Sit to Stand: Minimal Assistance - Patient > 75% Stand to Sit: Minimal Assistance - Patient > 75%  Trunk/Postural Assessment  Cervical Assessment Cervical Assessment: Exceptions to [facility] (forward head) Thoracic Assessment Thoracic Assessment: Exceptions to [facility] (rounded shoulder) Lumbar Assessment Lumbar Assessment: Exceptions to [facility] Postural Control Postural Control: Deficits on evaluation Trunk Control: posterior bias Righting Reactions: delayed  Balance Balance Balance Assessed: Yes Static Sitting Balance Static Sitting - Balance Support: Feet supported;Bilateral upper extremity supported Static Sitting - Level of Assistance: 5: Stand by assistance Dynamic Sitting Balance Dynamic Sitting - Balance Support: Feet supported;Bilateral upper extremity supported Dynamic Sitting - Level of Assistance: 4: Min assist Static Standing Balance Static Standing - Balance Support: During functional activity;Bilateral upper extremity supported Static Standing - Level of Assistance: 4: Min assist Dynamic Standing Balance Dynamic Standing - Balance Support: During functional activity;Bilateral upper extremity supported Dynamic Standing - Level  of Assistance: 3: Mod assist Extremity/Trunk Assessment RUE Assessment RUE Assessment: Exceptions to [facility] Active Range of Motion (AROM) Comments: 110 degrees of shoulder flexion RUE Body System: Neuro Brunstrum levels for arm and hand: Hand;Arm Brunstrum level for arm: Stage V Relative Independence from Synergy Brunstrum level for hand: Stage VI Isolated joint movements LUE Assessment LUE Assessment: Exceptions to [facility] General Strength Comments: 4/5   Juan Attridge E Ildefonso Keaney, MS, OTR/L  10/26/2023, 3:54 PM

## 2023-10-26 NOTE — Progress Notes (Addendum)
 Speech Language Pathology Discharge Summary  Patient Details  Name: Juan Brewer MRN: [id number] Date of Birth: [birth year]  Date of Discharge from SLP service:October 26, 2023  Today's Date: 10/26/2023 SLP Individual Time: 1400-1430 SLP Individual Time Calculation (min): 30 min   Skilled Therapeutic Interventions:   Skilled therapy session focused on cognitive goals and family education. SLP provided education to patients wife regarding ST role, goals and patients current level of cognitive functioning. SLP encouraged patient to continue completing higher level cognitive tasks upon d/c with supervision from family. SLP targeted cognitive goals through reassessment of SLUMs. Patient scored 21/30, a significant improvement since evaluation (8/30). Patient with continued, though mild, deficits in memory, problem solving, and emergent awareness. Patient left in chair with alarm set and call bell in reach. Continue POC.     Patient has met 5 of 6 long term goals.  Patient to discharge at overall Min;Modified Independent level.  Reasons goals not met: cont to require atleast modA for memory   Clinical Impression/Discharge Summary: Pt has made excellent gains and has met 5 of 6 LTG's this admission due to improved cognitive skills and swallowing safety. Pt is currently an overall minA for cognitive tasks (problem solving, attention, orientation), though continues to require up to modA to recall daily activities. Of note, patients progress limited by occasional refusal to participate in ST sessions. Patient requires mod i cues for utilization of swallowing compensatory strategies to minimize overt s/sx of aspiration with regular/thin diet. Pt/family education complete and pt will discharge home with supervision from friends/family/etc. Pt would benefit from [facility] f/u ST services to maximize cognition in order to maximize functional independence.   Care Partner:  Caregiver Able to Provide Assistance: Yes   Type of Caregiver Assistance: Physical;Cognitive  Recommendation:  Home Health SLP;24 hour supervision/assistance  Rationale for SLP Follow Up: Maximize cognitive function and independence   Equipment: n/a   Reasons for discharge: Discharged from hospital   Patient/Family Agrees with Progress Made and Goals Achieved: Yes    Gar Glance M.A., CCC-SLP 10/26/2023, 3:43 PM

## 2023-10-26 NOTE — Progress Notes (Signed)
 Patient ID: Juan Brewer, male   DOB: [birth year], 78 y.o.   MRN: [id number]  Met with pt and wife who is here for education in preparation for discharge tomorrow. Discussed follow up they have a private PT they have come and see pt and he likes them better and they do more than the home health he had in the past. Want to continue with this person. Has all equipment from past admissions. Feels ready to go home tomorrow. Wife to complete family education today.

## 2023-10-26 NOTE — Progress Notes (Signed)
 Occupational Therapy Session Note  Patient Details  Name: Juan Brewer MRN: [id number] Date of Birth: Dec 07, 1945  Today's Date: 10/26/2023 OT Individual Time: 1305-1400 OT Individual Time Calculation (min): 55 min    Short Term Goals: Week 2:  OT Short Term Goal 1 (Week 2): Patient will transition to sitting at edge of bed with close supervision OT Short Term Goal 2 (Week 2): Patient will complete a stand step transfer to wheelchair/toilet/ shower seat with no more than contact guard assistance OT Short Term Goal 3 (Week 2): Patient will remain out of bed x 1 hour at least 3 times/day to improve overall endurance.  Skilled Therapeutic Interventions/Progress Updates:  Skilled OT intervention completed with focus on family education with wife present. Pt received upright in bed, agreeable to session. No pain reported.  Pt required increased time for initiation and transitions especially with functional mobility but followed commands appropriately and was cooperative during session.   Transitioned EOB with supervision, CGA sit > stand and stand pivot without AD > w/c. Wife then present.  OT provided education on the following in prep for DC: -Recommended CGA for all mobility and up to min A for ADLs due to cognitive deficits, sequencing impairment and frequency of LOB -Confirmed bathroom set up, with wife reporting tub/shower with TTB already owned and pt used prior. Advised that continued use of TTB would be safest considering CLOF, environmental set up and pt's balance/endurance deficits -Advised use of hand held shower head for ease of bathing. Demonstrated method of tucking shower curtain under buttocks when seated on TTB to prevent water spillage in floor. Discussed using lateral leans for peri-washing and use/installation of grab bars for balance  -Energy conservation strategies- lukewarm water, proper ventilation via fan or cracked door to reduce steam, minimizing stands in shower  especially during hair washing with eyes closed, and planning ahead for shower tasks to be rested/have rest time following in case of fatigue -Reviewed use of BSC over top of toilet for raising the height or for use of push up rails to stand  Wife assisted with the following with min cues/feedback provided to wife on technique and how to improve body mechanics and safety for her and pt both: - CGA/min A sit > stands with RW - CGA/min A ambulatory transfers with RW with wife able to cue pt adequately to prevent premature sitting - CGA ambulatory transfer > TTB in tub/shower then cueing pt to transition on TTB but overall supervising - CGA ambulatory transfer with RW from w/c <> BSC over toilet. Then min A for toileting needs for continent BM and urinary void  Pt remained seated in w/c, with wife present and awaiting SLP session and with all needs in reach at end of session.   Therapy Documentation Precautions:  Precautions Precautions: Fall Precaution/Restrictions Comments: Fear of Falling Restrictions Weight Bearing Restrictions Per Provider Order: No    Therapy/Group: Individual Therapy  Melvyn Novas, MS, OTR/L  10/26/2023, 2:53 PM

## 2023-10-26 NOTE — Progress Notes (Signed)
 PROGRESS NOTE   Subjective/Complaints:  Pt without new complaints. Has chronic low back pain. Excited to be going home tomorrow.   ROS: Patient denies fever, rash, sore throat, blurred vision, dizziness, nausea, vomiting, diarrhea, cough, shortness of breath or chest pain,  neck pain, headache, or mood change.   Objective:   No results found.   Recent Labs    10/26/23 0503  WBC 4.5  HGB 11.6*  HCT 34.3*  PLT 102*     Recent Labs    10/26/23 0503  NA 140  K 3.8  CL 109  CO2 25  GLUCOSE 109*  BUN 17  CREATININE 1.40*  CALCIUM 9.5     Intake/Output Summary (Last 24 hours) at 10/26/2023 1013 Last data filed at 10/26/2023 0720 Gross per 24 hour  Intake 515 ml  Output 300 ml  Net 215 ml     Pressure Injury 10/18/23 Sacrum Right;Left;Medial Deep Tissue Pressure Injury - Purple or maroon localized area of discolored intact skin or blood-filled blister due to damage of underlying soft tissue from pressure and/or shear. wound present on 3/18 ad (Active)  10/18/23 0400  Location: Sacrum  Location Orientation: Right;Left;Medial  Staging: Deep Tissue Pressure Injury - Purple or maroon localized area of discolored intact skin or blood-filled blister due to damage of underlying soft tissue from pressure and/or shear.  Wound Description (Comments): wound present on 3/18 admission/ see old LDA- evolved into a DTI on 10/18/23  Present on Admission: Yes (previosuly documented as stage I- classification changed)     Pressure Injury 10/18/23 Buttocks Right Stage 2 -  Partial thickness loss of dermis presenting as a shallow open injury with a red, pink wound bed without slough. was present on admission on 3/18/ was included in another LDA/ see old LDA (Active)  10/18/23 0415  Location: Buttocks  Location Orientation: Right  Staging: Stage 2 -  Partial thickness loss of dermis presenting as a shallow open injury with a red, pink  wound bed without slough.  Wound Description (Comments): was present on admission on 3/18/ was included in another LDA/ see old LDA  Present on Admission: Yes    Physical Exam: Vital Signs Blood pressure 131/80, pulse (!) 58, temperature 97.7 F (36.5 C), resp. rate 18, height 5\' 11"  (1.803 m), weight 75.6 kg, SpO2 95%.  Constitutional: No distress . Vital signs reviewed. HEENT: NCAT, EOMI, oral membranes moist Neck: supple Cardiovascular: RRR without murmur. No JVD    Respiratory/Chest: CTA Bilaterally without wheezes or rales. Normal effort    GI/Abdomen: BS +, non-tender, non-distended Ext: no clubbing, cyanosis, or edema Psych: pleasant and cooperative   : Musculoskeletal:     Cervical back: Normal range of motion.     Right lower leg: No edema.     Left lower leg: No edema.     Comments: low back tender to palpation Skin:    Comments: Diffuse bruising/ecchymoses as well as skin lacs and abrasions of various stages on all 4 limbs. Legs most involved. Did not visualize backside wounds. Neurological:     Mental Status: He is alert and oriented to person, place, and time.     Comments: pt alert,  oriented to person, place, date. Knew he was going home tomorrow. MMT: RUE limited by shoulder pain 3/5 to 4/5 distally. LUE grossly 4- to 4+/5. RLE and LLE-- 3/5 HF, 3+ KE with pain compontnents. 4+/5 ADF/PF.  8        Assessment/Plan: 1. Functional deficits which require 3+ hours per day of interdisciplinary therapy in a comprehensive inpatient rehab setting. Physiatrist is providing close team supervision and 24 hour management of active medical problems listed below. Physiatrist and rehab team continue to assess barriers to discharge/monitor patient progress toward functional and medical goals  Care Tool:  Bathing    Body parts bathed by patient: Right arm, Left arm, Chest, Abdomen, Front perineal area, Face, Right upper leg, Left upper leg, Right lower leg, Left lower leg    Body parts bathed by helper: Buttocks     Bathing assist Assist Level: Minimal Assistance - Patient > 75%     Upper Body Dressing/Undressing Upper body dressing   What is the patient wearing?: Pull over shirt    Upper body assist Assist Level: Set up assist    Lower Body Dressing/Undressing Lower body dressing      What is the patient wearing?: Pants, Incontinence brief     Lower body assist Assist for lower body dressing: Contact Guard/Touching assist     Toileting Toileting Toileting Activity did not occur (Clothing management and hygiene only): N/A (no void or bm)  Toileting assist Assist for toileting: Moderate Assistance - Patient 50 - 74%     Transfers Chair/bed transfer  Transfers assist     Chair/bed transfer assist level: Minimal Assistance - Patient > 75%     Locomotion Ambulation   Ambulation assist      Assist level: Minimal Assistance - Patient > 75% Assistive device: Walker-rolling Max distance: 25'   Walk 10 feet activity   Assist     Assist level: Minimal Assistance - Patient > 75% Assistive device: Walker-rolling   Walk 50 feet activity   Assist Walk 50 feet with 2 turns activity did not occur: Safety/medical concerns         Walk 150 feet activity   Assist Walk 150 feet activity did not occur: Safety/medical concerns         Walk 10 feet on uneven surface  activity   Assist Walk 10 feet on uneven surfaces activity did not occur: Safety/medical concerns         Wheelchair     Assist Is the patient using a wheelchair?: Yes Type of Wheelchair: Manual    Wheelchair assist level: Supervision/Verbal cueing Max wheelchair distance: 150'    Wheelchair 50 feet with 2 turns activity    Assist        Assist Level: Supervision/Verbal cueing   Wheelchair 150 feet activity     Assist      Assist Level: Supervision/Verbal cueing   Blood pressure 131/80, pulse (!) 58, temperature 97.7 F (36.5  C), resp. rate 18, height 5\' 11"  (1.803 m), weight 75.6 kg, SpO2 95%.  Medical Problem List and Plan: 1. Functional deficits secondary to acute encephalopathy d/t NPH             -patient may shower -ELOS/Goals: 21-24 days, supervision to min assist with PT, OT, SLP, goal 10/27/23  Grounds pass ordered  -finalize dc planning   Vitamin D supplement started 2.  Antithrombotics: -DVT/anticoagulation:  Pharmaceutical: continue Lovenox 40mg  daily             -  antiplatelet therapy: continue Plavix 75mg  daily   3. Low back pain: kpad ordered. Tylenol as needed, asked nursing if kpad has been delivered  Continue lidoderm   -advised him to stay out of bed as much as possible 4.Anxiety: continue Xanaxq4H prn             -continue triazolam 0.25 mg daily at bedtime             -continue melatonin 3 mg daily at bedtime             -continue trazodone 50 mg daily at bedtime             -antipsychotic agents: n/a             -sleeping fairly well 5. Neuropsych/cognition: This patient is  capable of making decisions on his own behalf.   6. Skin/Wound Care: Routine skin care checks             -local care as needed to multiple lacs/abrasions/bruises -10/18/23 nursing stating sacral wound a bit more extensive, WOC consulted for assistance -continue supps and ample dietary intake--encourage him to push po 7. Fluids/Electrolytes/Nutrition: Routine Is and Os and follow-up chemistries             -pt reports good appetite although recorded intake is minimal on flow sheets             -check prealbumin with AM labs 8: Hypertension: monitor TID and prn             Atenolol d/ced due to bradycardia  Xanax increased to q4H prn  -10/24/23 BP intermittently elevated but mostly stable/good, monitor  -3/31/25bp seems controlled Vitals:   10/22/23 1952 10/23/23 0538 10/23/23 1429 10/23/23 1937  BP: 123/64 (!) 146/83 117/67 (!) 141/84   10/24/23 0500 10/24/23 1524 10/24/23 2042 10/24/23 2043  BP: 114/62 111/72  108/73 108/73   10/25/23 0447 10/25/23 1358 10/25/23 1926 10/26/23 0441  BP: 124/74 121/70 120/75 131/80    9: Hyperlipidemia: continue simvastatin 40mg  daily   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 Dorris Fetch and plan surveillance CT in 2-3 months   14: Tobacco use: d/c nicotine patch as nicotine can worsen back pain   15: AKI: Cr reviewed and has resolved, stable 10/17/23   16: Thrombocytopenia: reviewed and has resolved   17: Right shoulder pain with glenohumeral joint effusion s/p aspiration 1mL joint fluid - jt fluid with 1800 wbc's and neutrophils 50%, and calcium pyrophosphate crystals; gram stain with rare wbc's, cx pending. 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            Continue voltaren gel             -consider steroid injection depending upon course             -  consider outpt f/u with shoulder specialist. [facility]ess at [facility]   18: Right forearm cellulitis: fever resolved, no leukocytosis -completed course of cefadroxil 500 mg BID for 10 doses -resolved 19. Bradycardic: d/c atenolol, HR reviewed and has improved, continue to monitor HR TID  20. Constipation: improved, d/c miralax, last BM 3/26, d/c senna-docusate, last BM 3/27 -10/24/23 LBM 2 days ago, if no BM by tomorrow then may need miralax restarted -had large type 5 bm this morning. On no meds currently        LOS: 13 days A FACE TO FACE EVALUATION WAS PERFORMED  Ranelle Oyster 10/26/2023, 10:13 AM

## 2023-10-26 NOTE — Progress Notes (Signed)
 Physical Therapy Discharge Summary  Patient Details  Name: Juan Brewer MRN: [id number] Date of Birth: May 09, 1946  Date of Discharge from PT service:October 26, 2023  Patient has met 8 of 10 long term goals due to improved activity tolerance, improved balance, improved postural control, increased strength, ability to compensate for deficits, improved attention, and improved awareness.  Patient to discharge at an ambulatory level Min Assist.   Patient's care partner is independent to provide the necessary physical and cognitive assistance at discharge.  Functional Outcome Measure: = 14ft with RW on 3/31  Reasons goals not met: Pt required minA for dynamic sitting and modA dynamic standing balance due to persistent fear of falling, posterior bias.  Recommendation:  Patient will benefit from ongoing skilled PT services in home health setting to continue to advance safe functional mobility, address ongoing impairments in bed mobility, caregiver training, functional transfers, home safety, and minimize fall risk.  Equipment: No equipment provided  Reasons for discharge: treatment goals met and discharge from hospital  Patient/family agrees with progress made and goals achieved: Yes  PT Discharge Precautions/Restrictions Precautions Precautions: Fall Precaution/Restrictions Comments: Fear of Falling Restrictions Weight Bearing Restrictions Per Provider Order: No Pain Interference Pain Interference Pain Effect on Sleep: 2. Occasionally Pain Interference with Therapy Activities: 2. Occasionally Pain Interference with Day-to-Day Activities: 2. 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 Praxis Praxis: Impaired Praxis Impairment Details: Initiation  Cognition Overall Cognitive  Status: History of cognitive impairments - at baseline Arousal/Alertness: Awake/alert Orientation Level: Oriented to person;Oriented to place;Oriented to situation Attention: Sustained;Selective Sustained Attention: Impaired Sustained Attention Impairment: Verbal basic;Functional basic Selective Attention: Impaired Selective Attention Impairment: Verbal basic;Functional basic Memory: Impaired Memory Impairment: Storage deficit;Retrieval deficit;Decreased recall of new information Awareness: Impaired Awareness Impairment: Intellectual impairment Problem Solving: Impaired Problem Solving Impairment: Verbal basic;Functional basic Executive Function: Organizing;Self Monitoring;Self Correcting Organizing: Impaired Organizing Impairment: Functional basic Self Monitoring: Impaired Self Monitoring Impairment: Functional basic Self Correcting: Impaired Self Correcting Impairment: Functional basic Safety/Judgment: Impaired Sensation Sensation Light Touch: Impaired Detail Light Touch Impaired Details: Impaired RUE;Impaired RLE Hot/Cold: Appears Intact Proprioception: Appears Intact Stereognosis: Not tested Coordination Gross Motor Movements are Fluid and Coordinated: No Fine Motor Movements are Fluid and Coordinated: No Coordination and Movement Description: Global weakness and deconditioning; fear of falling inhibiting movement and effort Motor  Motor Motor: Hemiplegia;Abnormal postural alignment and control;Motor apraxia Motor - Skilled Clinical Observations: 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  Mobility Bed Mobility Bed Mobility: Sit to Supine;Supine to Sit;Rolling Left;Rolling Right Rolling Right: Supervision/verbal cueing Rolling Left: Supervision/Verbal cueing Supine to Sit: Supervision/Verbal cueing Sit to Supine: Supervision/Verbal cueing Transfers Transfers: Sit to Stand;Stand to Sit;Stand Pivot Transfers Sit to Stand: Minimal Assistance - Patient > 75% Stand to Sit: Minimal  Assistance - Patient > 75% Stand Pivot Transfers: Minimal Assistance - Patient > 75% Stand Pivot Transfer Details: Tactile cues for initiation;Verbal cues for precautions/safety;Verbal cues for gait pattern;Verbal cues for technique;Verbal cues for safe use of DME/AE Transfer (Assistive device): Rolling walker Locomotion  Gait Ambulation: Yes Gait Assistance: Minimal Assistance - Patient > 75% Gait Distance (Feet): 182 Feet Assistive device: Rolling walker Gait Assistance Details: Tactile cues for initiation;Tactile cues for posture;Tactile cues for weight shifting;Verbal cues for precautions/safety;Verbal cues for safe use of DME/AE;Verbal cues for technique;Verbal cues for gait pattern;Manual facilitation for weight shifting;Manual facilitation for placement Gait Gait: Yes Gait  Pattern: Impaired Gait Pattern: Trunk flexed;Poor foot clearance - left;Poor foot clearance - right;Right flexed knee in stance;Left flexed knee in stance;Decreased dorsiflexion - right;Decreased dorsiflexion - left;Decreased hip/knee flexion - left;Decreased hip/knee flexion - right;Step-through pattern Stairs / Additional Locomotion Stairs: Yes Stairs Assistance: Minimal Assistance - Patient > 75% Stair Management Technique: Two rails;Forwards;Step to pattern Number of Stairs: 12 Height of Stairs:  (6" + 3" mixture) Pick up small object from the floor assist level: Moderate Assistance - Patient 50 - 74% Wheelchair Mobility Wheelchair Mobility: Yes Wheelchair Assistance: Doctor, general practice: Both lower extermities Wheelchair Parts Management: Needs assistance Distance: 150'  Trunk/Postural Assessment  Cervical Assessment Cervical Assessment: Exceptions to [facility] (forward head) Thoracic Assessment Thoracic Assessment: Exceptions to [facility] (rounded shoulders) Lumbar Assessment Lumbar Assessment: Exceptions to [facility] Postural Control Postural Control: Deficits on evaluation Trunk  Control: posterior bias Righting Reactions: delayed  Balance Balance Balance Assessed: Yes Static Sitting Balance Static Sitting - Balance Support: Feet supported;Bilateral upper extremity supported Static Sitting - Level of Assistance: 5: Stand by assistance Dynamic Sitting Balance Dynamic Sitting - Balance Support: Feet supported;Bilateral upper extremity supported Dynamic Sitting - Level of Assistance: 4: Min assist Static Standing Balance Static Standing - Balance Support: During functional activity;Bilateral upper extremity supported Static Standing - Level of Assistance: 4: Min assist Dynamic Standing Balance Dynamic Standing - Balance Support: During functional activity;Bilateral upper extremity supported Dynamic Standing - Level of Assistance: 3: Mod assist Extremity Assessment      RLE Assessment RLE Assessment: Exceptions to [facility] General Strength Comments: Grossy 4/5 LLE Assessment LLE Assessment: Exceptions to [facility] General Strength Comments: Grossly 4/5   Dietrich Ke P Gumaro Brightbill  PT, DPT, CSRS  10/26/2023, 7:56 AM

## 2023-10-26 NOTE — Plan of Care (Signed)
  Problem: RH Balance Goal: LTG: Patient will maintain dynamic sitting balance (OT) Description: LTG:  Patient will maintain dynamic sitting balance with assistance during activities of daily living (OT) Outcome: Completed/Met   Problem: Sit to Stand Goal: LTG:  Patient will perform sit to stand in prep for activites of daily living with assistance level (OT) Description: LTG:  Patient will perform sit to stand in prep for activites of daily living with assistance level (OT) Outcome: Completed/Met   Problem: RH Eating Goal: LTG Patient will perform eating w/assist, cues/equip (OT) Description: LTG: Patient will perform eating with assist, with/without cues using equipment (OT) Outcome: Completed/Met   Problem: RH Grooming Goal: LTG Patient will perform grooming w/assist,cues/equip (OT) Description: LTG: Patient will perform grooming with assist, with/without cues using equipment (OT) Outcome: Completed/Met   Problem: RH Bathing Goal: LTG Patient will bathe all body parts with assist levels (OT) Description: LTG: Patient will bathe all body parts with assist levels (OT) Outcome: Completed/Met   Problem: RH Dressing Goal: LTG Patient will perform upper body dressing (OT) Description: LTG Patient will perform upper body dressing with assist, with/without cues (OT). Outcome: Completed/Met Goal: LTG Patient will perform lower body dressing w/assist (OT) Description: LTG: Patient will perform lower body dressing with assist, with/without cues in positioning using equipment (OT) Outcome: Completed/Met   Problem: RH Toileting Goal: LTG Patient will perform toileting task (3/3 steps) with assistance level (OT) Description: LTG: Patient will perform toileting task (3/3 steps) with assistance level (OT)  Outcome: Completed/Met   Problem: RH Toilet Transfers Goal: LTG Patient will perform toilet transfers w/assist (OT) Description: LTG: Patient will perform toilet transfers with assist,  with/without cues using equipment (OT) Outcome: Completed/Met   Problem: RH Tub/Shower Transfers Goal: LTG Patient will perform tub/shower transfers w/assist (OT) Description: LTG: Patient will perform tub/shower transfers with assist, with/without cues using equipment (OT) Outcome: Completed/Met   Problem: RH Attention Goal: LTG Patient will demonstrate this level of attention during functional activites (OT) Description: LTG:  Patient will demonstrate this level of attention during functional activites  (OT) Outcome: Completed/Met   Problem: RH Awareness Goal: LTG: Patient will demonstrate awareness during functional activites type of (OT) Description: LTG: Patient will demonstrate awareness during functional activites type of (OT) Outcome: Completed/Met

## 2023-10-26 NOTE — Plan of Care (Signed)
  Problem: RH Memory Goal: LTG Patient will demonstrate ability for day to day (SLP) Description: LTG:   Patient will demonstrate ability for day to day recall/carryover during cognitive/linguistic activities with assist  (SLP) Outcome: Not Met (cont to require up to modA)   Problem: RH Swallowing Goal: LTG Patient will consume least restrictive diet using compensatory strategies with assistance (SLP) Description: LTG:  Patient will consume least restrictive diet using compensatory strategies with assistance (SLP) Outcome: Completed/Met   Problem: RH Cognition - SLP Goal: RH LTG Patient will demonstrate orientation with cues Description:  LTG:  Patient will demonstrate orientation to person/place/time/situation with cues (SLP)   Outcome: Completed/Met   Problem: RH Problem Solving Goal: LTG Patient will demonstrate problem solving for (SLP) Description: LTG:  Patient will demonstrate problem solving for basic/complex daily situations with cues  (SLP) Outcome: Completed/Met   Problem: RH Attention Goal: LTG Patient will demonstrate this level of attention during functional activites (SLP) Description: LTG:  Patient will will demonstrate this level of attention during functional activites (SLP) Outcome: Completed/Met   Problem: RH Awareness Goal: LTG: Patient will demonstrate awareness during functional activites type of (SLP) Description: LTG: Patient will demonstrate awareness during functional activites type of (SLP) Outcome: Completed/Met

## 2023-10-27 ENCOUNTER — Other Ambulatory Visit (HOSPITAL_COMMUNITY): Payer: Self-pay

## 2023-10-27 DIAGNOSIS — M19011 Primary osteoarthritis, right shoulder: Secondary | ICD-10-CM

## 2023-10-27 MED ORDER — DICLOFENAC SODIUM 1 % EX GEL
2.0000 g | Freq: Three times a day (TID) | CUTANEOUS | 0 refills | Status: AC
Start: 2023-10-27 — End: ?
  Filled 2023-10-27: qty 100, 16d supply, fill #0

## 2023-10-27 MED ORDER — LIDOCAINE 5 % EX PTCH
1.0000 | MEDICATED_PATCH | CUTANEOUS | 1 refills | Status: DC
  Filled 2023-10-27: qty 15, 15d supply, fill #0

## 2023-10-27 MED ORDER — ACETAMINOPHEN 325 MG PO TABS
325.0000 mg | ORAL_TABLET | ORAL | Status: AC | PRN
Start: 2023-10-27 — End: ?

## 2023-10-27 MED ORDER — VITAMIN D3 25 MCG PO TABS
1000.0000 [IU] | ORAL_TABLET | Freq: Every day | ORAL | 0 refills | Status: AC
  Filled 2023-10-27: qty 30, 30d supply, fill #0

## 2023-10-27 NOTE — Progress Notes (Signed)
 PROGRESS NOTE   Subjective/Complaints:  Pt sitting in bed. Is anxious to get home today! His wife and son are coming to get him  ROS: Patient denies fever, rash, sore throat, blurred vision, dizziness, nausea, vomiting, diarrhea, cough, shortness of breath or chest pain, joint or back/neck pain, headache, or mood change.   Objective:   No results found.   Recent Labs    10/26/23 0503  WBC 4.5  HGB 11.6*  HCT 34.3*  PLT 102*     Recent Labs    10/26/23 0503  NA 140  K 3.8  CL 109  CO2 25  GLUCOSE 109*  BUN 17  CREATININE 1.40*  CALCIUM 9.5     Intake/Output Summary (Last 24 hours) at 10/27/2023 0932 Last data filed at 10/27/2023 0813 Gross per 24 hour  Intake 560 ml  Output 350 ml  Net 210 ml     Pressure Injury 10/18/23 Sacrum Right;Left;Medial Deep Tissue Pressure Injury - Purple or maroon localized area of discolored intact skin or blood-filled blister due to damage of underlying soft tissue from pressure and/or shear. wound present on 3/18 ad (Active)  10/18/23 0400  Location: Sacrum  Location Orientation: Right;Left;Medial  Staging: Deep Tissue Pressure Injury - Purple or maroon localized area of discolored intact skin or blood-filled blister due to damage of underlying soft tissue from pressure and/or shear.  Wound Description (Comments): wound present on 3/18 admission/ see old LDA- evolved into a DTI on 10/18/23  Present on Admission: Yes (previosuly documented as stage I- classification changed)     Pressure Injury 10/18/23 Buttocks Right Stage 2 -  Partial thickness loss of dermis presenting as a shallow open injury with a red, pink wound bed without slough. was present on admission on 3/18/ was included in another LDA/ see old LDA (Active)  10/18/23 0415  Location: Buttocks  Location Orientation: Right  Staging: Stage 2 -  Partial thickness loss of dermis presenting as a shallow open injury with a  red, pink wound bed without slough.  Wound Description (Comments): was present on admission on 3/18/ was included in another LDA/ see old LDA  Present on Admission: Yes    Physical Exam: Vital Signs Blood pressure 139/84, pulse 60, temperature 97.8 F (36.6 C), temperature source Oral, resp. rate 18, height 5\' 11"  (1.803 m), weight 75.6 kg, SpO2 98%.  Constitutional: No distress . Vital signs reviewed. HEENT: NCAT, EOMI, oral membranes moist Neck: supple Cardiovascular: RRR without murmur. No JVD    Respiratory/Chest: CTA Bilaterally without wheezes or rales. Normal effort    GI/Abdomen: BS +, non-tender, non-distended Ext: no clubbing, cyanosis, or edema Psych: pleasant and cooperative  Musculoskeletal:     Cervical back: Normal range of motion.     Right lower leg: No edema.     Left lower leg: No edema.     Comments: low back tender to palpation Skin:    Comments: Diffuse bruising/ecchymoses as well as skin lacs and abrasions of various stages on all 4 limbs. Legs still most involved. Continued backside wounds. Neurological:     Mental Status: He is alert and oriented to person, place, and time.  Comments: pt alert, oriented to person, place, date. Knew he was going home tomorrow. MMT: RUE limited by shoulder pain 3/5 to 4/5 distally. LUE grossly 4- to 4+/5. RLE and LLE-- 3/5 HF, 3+ KE with pain compontnents. 4+/5 ADF/PF.  C/W today's exam 10/27/2023.         Assessment/Plan: 1. Functional deficits which require 3+ hours per day of interdisciplinary therapy in a comprehensive inpatient rehab setting. Physiatrist is providing close team supervision and 24 hour management of active medical problems listed below. Physiatrist and rehab team continue to assess barriers to discharge/monitor patient progress toward functional and medical goals  Care Tool:  Bathing    Body parts bathed by patient: Right arm, Left arm, Chest, Abdomen, Front perineal area, Face, Right upper leg,  Left upper leg, Right lower leg, Left lower leg, Buttocks   Body parts bathed by helper: Buttocks     Bathing assist Assist Level: Contact Guard/Touching assist     Upper Body Dressing/Undressing Upper body dressing   What is the patient wearing?: Pull over shirt    Upper body assist Assist Level: Set up assist    Lower Body Dressing/Undressing Lower body dressing      What is the patient wearing?: Pants, Incontinence brief     Lower body assist Assist for lower body dressing: Minimal Assistance - Patient > 75%     Toileting Toileting Toileting Activity did not occur (Clothing management and hygiene only): N/A (no void or bm)  Toileting assist Assist for toileting: Minimal Assistance - Patient > 75%     Transfers Chair/bed transfer  Transfers assist     Chair/bed transfer assist level: Minimal Assistance - Patient > 75%     Locomotion Ambulation   Ambulation assist      Assist level: Minimal Assistance - Patient > 75% Assistive device: Walker-rolling Max distance: 182'   Walk 10 feet activity   Assist     Assist level: Minimal Assistance - Patient > 75% Assistive device: Walker-rolling   Walk 50 feet activity   Assist Walk 50 feet with 2 turns activity did not occur: Safety/medical concerns  Assist level: Minimal Assistance - Patient > 75% Assistive device: Walker-rolling    Walk 150 feet activity   Assist Walk 150 feet activity did not occur: Safety/medical concerns  Assist level: Minimal Assistance - Patient > 75% Assistive device: Walker-rolling    Walk 10 feet on uneven surface  activity   Assist Walk 10 feet on uneven surfaces activity did not occur: Safety/medical concerns         Wheelchair     Assist Is the patient using a wheelchair?: Yes Type of Wheelchair: Manual    Wheelchair assist level: Supervision/Verbal cueing Max wheelchair distance: 150'    Wheelchair 50 feet with 2 turns activity    Assist         Assist Level: Supervision/Verbal cueing   Wheelchair 150 feet activity     Assist      Assist Level: Supervision/Verbal cueing   Blood pressure 139/84, pulse 60, temperature 97.8 F (36.6 C), temperature source Oral, resp. rate 18, height 5\' 11"  (1.803 m), weight 75.6 kg, SpO2 98%.  Medical Problem List and Plan: 1. Functional deficits secondary to acute encephalopathy d/t NPH             -patient may shower -dc home today. F/u with Dr. Carlis Abbott, primary, NS 2.  Antithrombotics: -DVT/anticoagulation:  Pharmaceutical: continue Lovenox 40mg  daily             -  antiplatelet therapy: continue Plavix 75mg  daily   3. Low back pain: kpad ordered. Tylenol as needed, asked nursing if kpad has been delivered  Continue lidoderm   -advised him to stay out of bed as much as possible 4.Anxiety: continue Xanaxq4H prn             -continue triazolam 0.25 mg daily at bedtime             -continue melatonin 3 mg daily at bedtime             -continue trazodone 50 mg daily at bedtime             -antipsychotic agents: n/a             -has been sleeping fairly well 5. Neuropsych/cognition: This patient is  capable of making decisions on his own behalf.   6. Skin/Wound Care: Routine skin care checks             -local care as needed to multiple lacs/abrasions/bruises -10/18/23 nursing stating sacral wound a bit more extensive, WOC consulted for assistance -after discharge continue with dietary intake--encourage him to push po 7. Fluids/Electrolytes/Nutrition: Routine Is and Os and follow-up chemistries             -pt reports good appetite although recorded intake is minimal on flow sheets             -check prealbumin with AM labs 8: Hypertension: monitor TID and prn             Atenolol d/ced due to bradycardia  Xanax increased to q4H prn  -10/24/23 BP intermittently elevated but mostly stable/good, monitor  -4/1 bp controlled Vitals:   10/23/23 1937 10/24/23 0500 10/24/23 1524  10/24/23 2042  BP: (!) 141/84 114/62 111/72 108/73   10/24/23 2043 10/25/23 0447 10/25/23 1358 10/25/23 1926  BP: 108/73 124/74 121/70 120/75   10/26/23 0441 10/26/23 1610 10/26/23 1946 10/27/23 0445  BP: 131/80 126/68 (!) 157/80 139/84    9: Hyperlipidemia: continue simvastatin 40mg  daily   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 Dorris Fetch and plan surveillance CT in 2-3 months   14: Tobacco use: d/c nicotine patch as nicotine can worsen back pain   15: AKI: Cr reviewed and has resolved, stable 10/17/23   16: Thrombocytopenia: reviewed and has resolved   17: Right shoulder pain with glenohumeral joint effusion s/p aspiration 1mL joint fluid - jt fluid with 1800 wbc's and neutrophils 50%, and calcium pyrophosphate crystals; gram stain with rare wbc's, cx pending. 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            Continue voltaren gel             -consider steroid injection depending upon course             -  consider outpt f/u with shoulder specialist.  -this can be discussed as outpt at [facility]   18: Right forearm cellulitis: fever resolved, no leukocytosis -completed course of cefadroxil 500 mg BID for 10 doses -resolved 19. Bradycardic: d/c atenolol, HR reviewed and has improved, continue to monitor HR TID  20. Constipation: improved, d/c miralax, last BM 3/26, d/c senna-docusate, last BM 3/27 -10/24/23 LBM 2 days ago, if no BM by tomorrow then may need miralax restarted -had large type 5 bm  3/31. On no meds currently  -diet/fluids reviewed        LOS: 14 days A FACE TO FACE EVALUATION WAS PERFORMED  Ranelle Oyster 10/27/2023, 9:32 AM

## 2023-10-27 NOTE — Progress Notes (Signed)
 Inpatient Rehabilitation Discharge Medication Review by a Pharmacist  A complete drug regimen review was completed for this patient to identify any potential clinically significant medication issues.  High Risk Drug Classes Is patient taking? Indication by Medication  Antipsychotic No   Anticoagulant No   Antibiotic No   Opioid No   Antiplatelet Yes Clopidogrel - CVA  Hypoglycemics/insulin No   Vasoactive Medication No   Chemotherapy No   Other Yes Lidocaine patch - pain Alprazolam - anxiety Simvastatin - HLD Trelegy - COPD Triazolam - sleep     Type of Medication Issue Identified Description of Issue Recommendation(s)  Drug Interaction(s) (clinically significant)     Duplicate Therapy     Allergy     No Medication Administration End Date     Incorrect Dose     Additional Drug Therapy Needed     Significant med changes from prior encounter (inform family/care partners about these prior to discharge).    Other       Clinically significant medication issues were identified that warrant physician communication and completion of prescribed/recommended actions by midnight of the next day:  No  Name of provider notified for urgent issues identified:   Provider Method of Notification:     Pharmacist comments: None  Time spent performing this drug regimen review (minutes):  20 minutes  Thank you Okey Regal, PharmD

## 2023-10-27 NOTE — Progress Notes (Signed)
 Inpatient Rehabilitation Care Coordinator Discharge Note   Patient Details  Name: Juan Brewer MRN: [id number] Date of Birth: Jun 16, 1946   Discharge location: HOME WITH WIFE AND FAMILY TO ASSIST WITH CARE  Length of Stay: 14 DAYS  Discharge activity level: MIN-CGA LEVEL  Home/community participation: ACTIVE  Patient response YQ:MVHQIO Literacy - How often do you need to have someone help you when you read instructions, pamphlets, or other written material from your doctor or pharmacy?: Rarely  Patient response NG:EXBMWU Isolation - How often do you feel lonely or isolated from those around you?: Never  Services provided included: MD, RD, PT, OT, SLP, RN, CM, Pharmacy, SW  Financial Services:  Financial Services Utilized: Medicare    Choices offered to/list presented to: WIFE  Follow-up services arranged:  Other (Comment) (HAVE A PRIVATE PAY PT WHO THEY WILL CONTINUE TO USE)     HAS ALL DME FROM PREVIOUS ADMISSIONS      Patient response to transportation need: Is the patient able to respond to transportation needs?: Yes In the past 12 months, has lack of transportation kept you from medical appointments or from getting medications?: No In the past 12 months, has lack of transportation kept you from meetings, work, or from getting things needed for daily living?: No   Patient/Family verbalized understanding of follow-up arrangements:  Yes  Individual responsible for coordination of the follow-up plan: TERRI-WIFE [phone]  Confirmed correct DME delivered: Lucy Chris 10/27/2023    Comments (or additional information):WIFE WAS HERE YESTERDAY [employer] ON EDUCATION AWARE OF HUSBAND'S CARE NEEDS.  Summary of Stay    Date/Time Discharge Planning CSW  10/21/23 0915 Home with wife and other fmaily members to assist-aware of his deficits and hopeful he will do well here RGD  10/14/23 0849 New evaluation-home with wife who does work a few days per week. 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

## 2023-12-04 ENCOUNTER — Encounter: Attending: Physical Medicine and Rehabilitation | Admitting: Physical Medicine and Rehabilitation
